# Patient Record
Sex: Male | Born: 1945 | Race: Black or African American | Hispanic: No | Marital: Married | Smoking: Former smoker
Health system: Southern US, Community
[De-identification: ages and names within clinical notes are randomized; demographics above are authoritative.]

## PROBLEM LIST (undated history)

## (undated) DIAGNOSIS — T4145XA Adverse effect of unspecified anesthetic, initial encounter: Secondary | ICD-10-CM

## (undated) DIAGNOSIS — C801 Malignant (primary) neoplasm, unspecified: Secondary | ICD-10-CM

## (undated) DIAGNOSIS — I739 Peripheral vascular disease, unspecified: Secondary | ICD-10-CM

## (undated) DIAGNOSIS — I219 Acute myocardial infarction, unspecified: Secondary | ICD-10-CM

## (undated) DIAGNOSIS — Z87442 Personal history of urinary calculi: Secondary | ICD-10-CM

## (undated) DIAGNOSIS — M1990 Unspecified osteoarthritis, unspecified site: Secondary | ICD-10-CM

## (undated) DIAGNOSIS — N401 Enlarged prostate with lower urinary tract symptoms: Secondary | ICD-10-CM

## (undated) DIAGNOSIS — I2510 Atherosclerotic heart disease of native coronary artery without angina pectoris: Secondary | ICD-10-CM

## (undated) DIAGNOSIS — N201 Calculus of ureter: Secondary | ICD-10-CM

## (undated) DIAGNOSIS — E119 Type 2 diabetes mellitus without complications: Secondary | ICD-10-CM

## (undated) DIAGNOSIS — I10 Essential (primary) hypertension: Secondary | ICD-10-CM

## (undated) DIAGNOSIS — R011 Cardiac murmur, unspecified: Secondary | ICD-10-CM

## (undated) DIAGNOSIS — T8859XA Other complications of anesthesia, initial encounter: Secondary | ICD-10-CM

## (undated) DIAGNOSIS — K449 Diaphragmatic hernia without obstruction or gangrene: Secondary | ICD-10-CM

## (undated) DIAGNOSIS — R351 Nocturia: Secondary | ICD-10-CM

## (undated) DIAGNOSIS — I350 Nonrheumatic aortic (valve) stenosis: Secondary | ICD-10-CM

## (undated) DIAGNOSIS — Z972 Presence of dental prosthetic device (complete) (partial): Secondary | ICD-10-CM

## (undated) DIAGNOSIS — K08109 Complete loss of teeth, unspecified cause, unspecified class: Secondary | ICD-10-CM

## (undated) DIAGNOSIS — Z973 Presence of spectacles and contact lenses: Secondary | ICD-10-CM

## (undated) DIAGNOSIS — H409 Unspecified glaucoma: Secondary | ICD-10-CM

## (undated) DIAGNOSIS — R6510 Systemic inflammatory response syndrome (SIRS) of non-infectious origin without acute organ dysfunction: Secondary | ICD-10-CM

## (undated) HISTORY — PX: VASCULAR SURGERY: SHX849

## (undated) HISTORY — PX: CORONARY ARTERY BYPASS GRAFT: SHX141

## (undated) HISTORY — PX: AORTIC VALVE REPLACEMENT: SHX41

## (undated) HISTORY — PX: COLONOSCOPY: SHX174

---

## 2017-04-02 DIAGNOSIS — R011 Cardiac murmur, unspecified: Secondary | ICD-10-CM | POA: Diagnosis not present

## 2017-04-02 DIAGNOSIS — N400 Enlarged prostate without lower urinary tract symptoms: Secondary | ICD-10-CM | POA: Diagnosis not present

## 2017-04-02 DIAGNOSIS — Z8639 Personal history of other endocrine, nutritional and metabolic disease: Secondary | ICD-10-CM | POA: Diagnosis not present

## 2017-04-02 DIAGNOSIS — Z1389 Encounter for screening for other disorder: Secondary | ICD-10-CM | POA: Diagnosis not present

## 2017-04-02 DIAGNOSIS — E041 Nontoxic single thyroid nodule: Secondary | ICD-10-CM | POA: Diagnosis not present

## 2017-04-02 DIAGNOSIS — Z136 Encounter for screening for cardiovascular disorders: Secondary | ICD-10-CM | POA: Diagnosis not present

## 2017-04-02 DIAGNOSIS — Z7984 Long term (current) use of oral hypoglycemic drugs: Secondary | ICD-10-CM | POA: Diagnosis not present

## 2017-04-02 DIAGNOSIS — E1159 Type 2 diabetes mellitus with other circulatory complications: Secondary | ICD-10-CM | POA: Diagnosis not present

## 2017-04-02 DIAGNOSIS — I10 Essential (primary) hypertension: Secondary | ICD-10-CM | POA: Diagnosis not present

## 2017-04-02 DIAGNOSIS — Z0000 Encounter for general adult medical examination without abnormal findings: Secondary | ICD-10-CM | POA: Diagnosis not present

## 2017-04-02 DIAGNOSIS — I739 Peripheral vascular disease, unspecified: Secondary | ICD-10-CM | POA: Diagnosis not present

## 2017-04-05 ENCOUNTER — Other Ambulatory Visit: Payer: Self-pay | Admitting: Orthopedic Surgery

## 2017-04-05 ENCOUNTER — Ambulatory Visit: Payer: Self-pay | Admitting: Orthopedic Surgery

## 2017-04-06 ENCOUNTER — Other Ambulatory Visit: Payer: Self-pay | Admitting: Family Medicine

## 2017-04-06 DIAGNOSIS — E041 Nontoxic single thyroid nodule: Secondary | ICD-10-CM

## 2017-04-13 ENCOUNTER — Ambulatory Visit
Admission: RE | Admit: 2017-04-13 | Discharge: 2017-04-13 | Disposition: A | Discharge status: Home / Self Care | Payer: PPO | Source: Ambulatory Visit | Attending: Family Medicine | Admitting: Family Medicine

## 2017-04-13 DIAGNOSIS — R972 Elevated prostate specific antigen [PSA]: Secondary | ICD-10-CM | POA: Diagnosis not present

## 2017-04-13 DIAGNOSIS — N5201 Erectile dysfunction due to arterial insufficiency: Secondary | ICD-10-CM | POA: Diagnosis not present

## 2017-04-13 DIAGNOSIS — R3915 Urgency of urination: Secondary | ICD-10-CM | POA: Diagnosis not present

## 2017-04-13 DIAGNOSIS — E041 Nontoxic single thyroid nodule: Secondary | ICD-10-CM | POA: Diagnosis not present

## 2017-05-19 ENCOUNTER — Encounter (HOSPITAL_COMMUNITY): Payer: Self-pay

## 2017-05-19 NOTE — Patient Instructions (Addendum)
Jeremy Nicholson  05/19/2017   Your procedure is scheduled on: Friday, Dec. 28, 2018   Report to [facility] Main  Entrance   Take [place]  elevators to 3rd floor to  Jeremy Nicholson at 11:15 AM.   Call this number if you have problems the morning of surgery [phone]    Remember: ONLY 1 PERSON MAY GO WITH YOU TO SHORT STAY TO GET  READY MORNING OF Jeremy Nicholson.   Do not eat food or drink liquids :After Midnight.    Take these medicines the morning of surgery with A SIP OF WATER: Amlodipine, Metoprolol   Do NOT take Glucotrol and Metformin the evening before surgery   DO NOT TAKE ANY DIABETIC MEDICATIONS DAY OF YOUR SURGERY                               You may not have any metal on your body including jewelry and body piercings               Do not wear make-up, lotions, powders, perfumes, or deodorant                          Men may shave face and neck.   Do not bring valuables to the hospital. Jeremy Nicholson.   Contacts, dentures or bridgework may not be worn into surgery.   Leave suitcase in the car. After surgery it may be brought to your room.              Please read over the following fact sheets you were given: _____________________________________________________________________     [facility] - Preparing for Surgery Before surgery, you can play an important role.  Because skin is not sterile, your skin needs to be as free of germs as possible.  You can reduce the number of germs on your skin by washing with CHG (chlorahexidine gluconate) soap before surgery.  CHG is an antiseptic cleaner which kills germs and bonds with the skin to continue killing germs even after washing. Please DO NOT use if you have an allergy to CHG or antibacterial soaps.  If your skin becomes reddened/irritated stop using the CHG and inform your nurse when you arrive at Short Stay. Do not shave (including legs and  underarms) for at least 48 hours prior to the first CHG shower.  You may shave your face/neck.  Please follow these instructions carefully:  1.  Shower with CHG Soap the night before surgery and the  morning of surgery.  2.  If you choose to wash your hair, wash your hair first as usual with your normal  shampoo.  3.  After you shampoo, rinse your hair and body thoroughly to remove the shampoo.                             4.  Use CHG as you would any other liquid soap.  You can apply chg directly to the skin and wash.  Gently with a scrungie or clean washcloth.  5.  Apply the CHG Soap to your body ONLY FROM THE NECK DOWN.   Do   not use on  face/ open                           Wound or open sores. Avoid contact with eyes, ears mouth and   genitals (private parts).                       Wash face,  Genitals (private parts) with your normal soap.             6.  Wash thoroughly, paying special attention to the area where your    surgery  will be performed.  7.  Thoroughly rinse your body with warm water from the neck down.  8.  DO NOT shower/wash with your normal soap after using and rinsing off the CHG Soap.                9.  Pat yourself dry with a clean towel.            10.  Wear clean pajamas.            11.  Place clean sheets on your bed the night of your first shower and do not  sleep with pets. Day of Surgery : Do not apply any lotions/deodorants the morning of surgery.  Please wear clean clothes to the hospital/surgery center.  FAILURE TO FOLLOW THESE INSTRUCTIONS MAY RESULT IN THE CANCELLATION OF YOUR SURGERY  PATIENT SIGNATURE_________________________________  NURSE SIGNATURE__________________________________  ________________________________________________________________________   Jeremy Nicholson  An incentive spirometer is a tool that can help keep your lungs clear and active. This tool measures how well you are filling your lungs with each breath. Taking long deep  breaths may help reverse or decrease the chance of developing breathing (pulmonary) problems (especially infection) following:  A long period of time when you are unable to move or be active. BEFORE THE PROCEDURE   If the spirometer includes an indicator to show your best effort, your nurse or respiratory therapist will set it to a desired goal.  If possible, sit up straight or lean slightly forward. Try not to slouch.  Hold the incentive spirometer in an upright position. INSTRUCTIONS FOR USE  1. Sit on the edge of your bed if possible, or sit up as far as you can in bed or on a chair. 2. Hold the incentive spirometer in an upright position. 3. Breathe out normally. 4. Place the mouthpiece in your mouth and seal your lips tightly around it. 5. Breathe in slowly and as deeply as possible, raising the piston or the ball toward the top of the column. 6. Hold your breath for 3-5 seconds or for as long as possible. Allow the piston or ball to fall to the bottom of the column. 7. Remove the mouthpiece from your mouth and breathe out normally. 8. Rest for a few seconds and repeat Steps 1 through 7 at least 10 times every 1-2 hours when you are awake. Take your time and take a few normal breaths between deep breaths. 9. The spirometer may include an indicator to show your best effort. Use the indicator as a goal to work toward during each repetition. 10. After each set of 10 deep breaths, practice coughing to be sure your lungs are clear. If you have an incision (the cut made at the time of surgery), support your incision when coughing by placing a pillow or rolled up towels firmly against it. Once you are able to get out  of bed, walk around indoors and cough well. You may stop using the incentive spirometer when instructed by your caregiver.  RISKS AND COMPLICATIONS  Take your time so you do not get dizzy or light-headed.  If you are in pain, you may need to take or ask for pain medication before  doing incentive spirometry. It is harder to take a deep breath if you are having pain. AFTER USE  Rest and breathe slowly and easily.  It can be helpful to keep track of a log of your progress. Your caregiver can provide you with a simple table to help with this. If you are using the spirometer at home, follow these instructions: [place] IF:   You are having difficultly using the spirometer.  You have trouble using the spirometer as often as instructed.  Your pain medication is not giving enough relief while using the spirometer.  You develop fever of 100.5 F (38.1 C) or higher. SEEK IMMEDIATE MEDICAL CARE IF:   You cough up bloody sputum that had not been present before.  You develop fever of 102 F (38.9 C) or greater.  You develop worsening pain at or near the incision site. MAKE SURE YOU:   Understand these instructions.  Will watch your condition.  Will get help right away if you are not doing well or get worse. Document Released: 09/28/2006 Document Revised: 08/10/2011 Document Reviewed: 11/29/2006 [facility] Patient Information 2014 [place], [place].   ________________________________________________________________________

## 2017-05-19 NOTE — Pre-Procedure Instructions (Signed)
The following are in the chart: Surgical clearance Henrene Pastor Last office note Heyat 05/05/17 EKG 10/14/16 CBCw/diff, Hgb A1C 7.8, BMP 05/05/17

## 2017-05-21 ENCOUNTER — Encounter (HOSPITAL_COMMUNITY): Payer: Self-pay

## 2017-05-21 ENCOUNTER — Encounter (INDEPENDENT_AMBULATORY_CARE_PROVIDER_SITE_OTHER): Payer: Self-pay

## 2017-05-21 ENCOUNTER — Other Ambulatory Visit: Payer: Self-pay

## 2017-05-21 ENCOUNTER — Encounter (HOSPITAL_COMMUNITY)
Admission: RE | Admit: 2017-05-21 | Discharge: 2017-05-21 | Disposition: A | Discharge status: Home / Self Care | Payer: Non-veteran care | Source: Ambulatory Visit | Attending: Specialist | Admitting: Specialist

## 2017-05-21 DIAGNOSIS — M1712 Unilateral primary osteoarthritis, left knee: Secondary | ICD-10-CM | POA: Insufficient documentation

## 2017-05-21 DIAGNOSIS — Z01812 Encounter for preprocedural laboratory examination: Secondary | ICD-10-CM | POA: Diagnosis present

## 2017-05-21 HISTORY — DX: Unspecified osteoarthritis, unspecified site: M19.90

## 2017-05-21 HISTORY — DX: Essential (primary) hypertension: I10

## 2017-05-21 LAB — APTT: aPTT: 31 seconds (ref 24–36)

## 2017-05-21 LAB — URINALYSIS, ROUTINE W REFLEX MICROSCOPIC
BACTERIA UA: NONE SEEN
BILIRUBIN URINE: NEGATIVE
Hgb urine dipstick: NEGATIVE
Ketones, ur: NEGATIVE mg/dL
LEUKOCYTES UA: NEGATIVE
NITRITE: NEGATIVE
PH: 6 (ref 5.0–8.0)
Protein, ur: 100 mg/dL — AB
Specific Gravity, Urine: 1.024 (ref 1.005–1.030)
Squamous Epithelial / LPF: NONE SEEN

## 2017-05-21 LAB — PROTIME-INR
INR: 0.95
PROTHROMBIN TIME: 12.6 s (ref 11.4–15.2)

## 2017-05-21 LAB — SURGICAL PCR SCREEN
MRSA, PCR: NEGATIVE
Staphylococcus aureus: NEGATIVE

## 2017-05-21 LAB — NO BLOOD PRODUCTS

## 2017-05-21 LAB — GLUCOSE, CAPILLARY: Glucose-Capillary: 82 mg/dL (ref 65–99)

## 2017-05-21 NOTE — Pre-Procedure Instructions (Signed)
UA results 05/21/17 faxed to Dr. Theda Sers via epic.

## 2017-05-21 NOTE — Pre-Procedure Instructions (Signed)
Dr. Sharlene Motts made aware of Mr. Jeremy Nicholson refusal of blood product consent, did not request to consult with patient at this time.

## 2017-05-26 NOTE — H&P (Signed)
TOTAL KNEE ADMISSION H&P  Patient is being admitted for left total knee arthroplasty.  Subjective:  Chief Complaint:left knee pain.  HPI: Jeremy Nicholson, 71 y.o. male, has a history of pain and functional disability in the left knee due to arthritis and has failed non-surgical conservative treatments for greater than 12 weeks to includecorticosteriod injections, viscosupplementation injections, weight reduction as appropriate and activity modification.  Onset of symptoms was gradual, starting 3 years ago with gradually worsening course since that time. The patient noted no past surgery on the left knee(s).  Patient currently rates pain in the left knee(s) at 8 out of 10 with activity. Patient has worsening of pain with activity and weight bearing, pain that interferes with activities of daily living, pain with passive range of motion and joint swelling.  Patient has evidence of subchondral sclerosis, periarticular osteophytes and joint space narrowing by imaging studies. There is no active infection.  There are no active problems to display for this patient.  Past Medical History:  Diagnosis Date  . Arthritis   . Diabetes mellitus without complication ([place])   . Heart murmur   . History of hiatal hernia   . Hypertension     Past Surgical History:  Procedure Laterality Date  . COLONOSCOPY      No current facility-administered medications for this encounter.    Current Outpatient Medications  Medication Sig Dispense Refill Last Dose  . amLODipine (NORVASC) 10 MG tablet Take 10 mg by mouth daily.     Marland Kitchen aspirin EC 81 MG tablet Take 81 mg by mouth daily.     . capsaicin (ZOSTRIX) 0.025 % cream Apply 1 application topically 2 (two) times daily as needed (for knee).     . Cholecalciferol 1000 units tablet Take 1,000 Units by mouth daily.     Marland Kitchen glipiZIDE (GLUCOTROL) 10 MG tablet Take 10 mg by mouth 2 (two) times daily.     Marland Kitchen ibuprofen (ADVIL,MOTRIN) 600 MG tablet Take 600 mg by mouth every 6  (six) hours as needed for headache or moderate pain.     . metFORMIN (GLUCOPHAGE) 500 MG tablet Take 1,000 mg by mouth 2 (two) times daily with a meal.     . metoprolol tartrate (LOPRESSOR) 50 MG tablet Take 50 mg by mouth daily.     . niacin (SLO-NIACIN) 500 MG tablet Take 500 mg by mouth daily.     . tamsulosin (FLOMAX) 0.4 MG CAPS capsule Take 0.4 mg by mouth at bedtime.     . empagliflozin (JARDIANCE) 10 MG TABS tablet Take 10 mg by mouth daily.      No Known Allergies  Social History   Tobacco Use  . Smoking status: Never Smoker  . Smokeless tobacco: Never Used  Substance Use Topics  . Alcohol use: No    Frequency: Never    No family history on file.   Review of Systems  Constitutional: Negative.   HENT: Negative.   Eyes: Negative.   Respiratory: Negative.   Cardiovascular: Negative for chest pain and orthopnea.  Gastrointestinal: Negative.   Genitourinary: Negative.   Musculoskeletal: Positive for joint pain.  Skin: Negative.   Neurological: Negative.   Endo/Heme/Allergies: Negative.   Psychiatric/Behavioral: Negative.     Objective:  Physical Exam  Constitutional: He is oriented to person, place, and time. He appears well-developed.  HENT:  Head: Normocephalic.  Eyes: EOM are normal.  Neck: Normal range of motion.  Cardiovascular: Normal rate and intact distal pulses.  Murmur heard. Respiratory: Effort  normal.  GI: Soft.  Musculoskeletal:  Knee pain. Knee is stable. LE grossly n/v intact.  Neurological: He is alert and oriented to person, place, and time. He has normal reflexes.  Skin: Skin is warm and dry.  Psychiatric: His behavior is normal.    Vital signs in last 24 hours:    Labs:   Estimated body mass index is 26.58 kg/m as calculated from the following:   Height as of 05/21/17: 5\' 9"  (1.753 m).   Weight as of 05/21/17: 81.6 kg (180 lb).   Imaging Review Plain radiographs demonstrate severe degenerative joint disease of the left knee(s).  The overall alignment ismild varus. The bone quality appears to be good for age and reported activity level.  Assessment/Plan:  End stage arthritis, left knee   The patient history, physical examination, clinical judgment of the provider and imaging studies are consistent with end stage degenerative joint disease of the left knee(s) and total knee arthroplasty is deemed medically necessary. The treatment options including medical management, injection therapy arthroscopy and arthroplasty were discussed at length. The risks and benefits of total knee arthroplasty were presented and reviewed. The risks due to aseptic loosening, infection, stiffness, patella tracking problems, thromboembolic complications and other imponderables were discussed. The patient acknowledged the explanation, agreed to proceed with the plan and consent was signed. Patient is being admitted for inpatient treatment for surgery, pain control, PT, OT, prophylactic antibiotics, VTE prophylaxis, progressive ambulation and ADL's and discharge planning. The patient is planning to be discharged home with home health services.  Will use IV tranexamic acid. Contraindications and adverse affects of Tranexamic acid discussed in detail. Patient denies any of these at this time and understands the risks and benefits.

## 2017-05-27 NOTE — H&P (View-Only) (Signed)
bmet 05-26-17 , hemaglobin a 1c  05-26-17, ua 05-26-17 quest labs received and placed on pt chart

## 2017-05-27 NOTE — Progress Notes (Signed)
bmet 05-26-17 , hemaglobin a 1c  05-26-17, ua 05-26-17 quest labs received and placed on pt chart

## 2017-05-28 ENCOUNTER — Other Ambulatory Visit: Payer: Self-pay

## 2017-05-28 ENCOUNTER — Inpatient Hospital Stay (HOSPITAL_COMMUNITY)
Admission: RE | Admit: 2017-05-28 | Discharge: 2017-05-30 | DRG: 470 | Disposition: A | Discharge status: Home / Self Care | Payer: Non-veteran care | Source: Ambulatory Visit | Attending: Specialist | Admitting: Specialist

## 2017-05-28 ENCOUNTER — Encounter (HOSPITAL_COMMUNITY)
Admission: RE | Disposition: A | Discharge status: Home / Self Care | Payer: Self-pay | Source: Ambulatory Visit | Attending: Specialist

## 2017-05-28 ENCOUNTER — Inpatient Hospital Stay (HOSPITAL_COMMUNITY): Payer: Non-veteran care | Admitting: Registered Nurse

## 2017-05-28 ENCOUNTER — Encounter (HOSPITAL_COMMUNITY): Payer: Self-pay | Admitting: Registered Nurse

## 2017-05-28 DIAGNOSIS — R011 Cardiac murmur, unspecified: Secondary | ICD-10-CM | POA: Diagnosis present

## 2017-05-28 DIAGNOSIS — M25562 Pain in left knee: Secondary | ICD-10-CM | POA: Diagnosis present

## 2017-05-28 DIAGNOSIS — Z7982 Long term (current) use of aspirin: Secondary | ICD-10-CM | POA: Diagnosis not present

## 2017-05-28 DIAGNOSIS — Z7984 Long term (current) use of oral hypoglycemic drugs: Secondary | ICD-10-CM | POA: Diagnosis not present

## 2017-05-28 DIAGNOSIS — M1712 Unilateral primary osteoarthritis, left knee: Secondary | ICD-10-CM | POA: Diagnosis present

## 2017-05-28 DIAGNOSIS — E119 Type 2 diabetes mellitus without complications: Secondary | ICD-10-CM | POA: Diagnosis present

## 2017-05-28 DIAGNOSIS — Z96659 Presence of unspecified artificial knee joint: Secondary | ICD-10-CM

## 2017-05-28 DIAGNOSIS — R5082 Postprocedural fever: Secondary | ICD-10-CM | POA: Diagnosis not present

## 2017-05-28 DIAGNOSIS — I10 Essential (primary) hypertension: Secondary | ICD-10-CM | POA: Diagnosis present

## 2017-05-28 DIAGNOSIS — Z96652 Presence of left artificial knee joint: Secondary | ICD-10-CM | POA: Diagnosis not present

## 2017-05-28 DIAGNOSIS — R6510 Systemic inflammatory response syndrome (SIRS) of non-infectious origin without acute organ dysfunction: Secondary | ICD-10-CM | POA: Diagnosis not present

## 2017-05-28 DIAGNOSIS — I361 Nonrheumatic tricuspid (valve) insufficiency: Secondary | ICD-10-CM | POA: Diagnosis not present

## 2017-05-28 HISTORY — PX: TOTAL KNEE ARTHROPLASTY: SHX125

## 2017-05-28 LAB — GLUCOSE, CAPILLARY
GLUCOSE-CAPILLARY: 310 mg/dL — AB (ref 65–99)
Glucose-Capillary: 106 mg/dL — ABNORMAL HIGH (ref 65–99)
Glucose-Capillary: 237 mg/dL — ABNORMAL HIGH (ref 65–99)

## 2017-05-28 SURGERY — ARTHROPLASTY, KNEE, TOTAL
Anesthesia: Spinal | Site: Knee | Laterality: Left

## 2017-05-28 MED ORDER — MIDAZOLAM HCL 2 MG/2ML IJ SOLN
INTRAMUSCULAR | Status: AC
  Filled 2017-05-28: qty 2

## 2017-05-28 MED ORDER — ASPIRIN EC 325 MG PO TBEC: 325.0000 mg | DELAYED_RELEASE_TABLET | Freq: Two times a day (BID) | ORAL | 0 refills | Status: DC

## 2017-05-28 MED ORDER — PHENYLEPHRINE 40 MCG/ML (10ML) SYRINGE FOR IV PUSH (FOR BLOOD PRESSURE SUPPORT)
PREFILLED_SYRINGE | INTRAVENOUS | Status: AC
  Filled 2017-05-28: qty 10

## 2017-05-28 MED ORDER — PROPOFOL 10 MG/ML IV BOLUS
INTRAVENOUS | Status: AC
  Filled 2017-05-28: qty 60

## 2017-05-28 MED ORDER — STERILE WATER FOR IRRIGATION IR SOLN
Status: DC | PRN
  Administered 2017-05-28: 2000 mL

## 2017-05-28 MED ORDER — OXYCODONE HCL 5 MG PO TABS: 5.0000 mg | ORAL_TABLET | ORAL | 0 refills | Status: DC | PRN

## 2017-05-28 MED ORDER — GLIPIZIDE 10 MG PO TABS
10.0000 mg | ORAL_TABLET | Freq: Two times a day (BID) | ORAL | Status: DC
  Administered 2017-05-29 – 2017-05-30 (×3): 10 mg via ORAL
  Filled 2017-05-28 (×3): qty 1

## 2017-05-28 MED ORDER — METHOCARBAMOL 500 MG PO TABS: 500.0000 mg | ORAL_TABLET | Freq: Three times a day (TID) | ORAL | 1 refills | Status: DC | PRN

## 2017-05-28 MED ORDER — DEXAMETHASONE SODIUM PHOSPHATE 10 MG/ML IJ SOLN
INTRAMUSCULAR | Status: AC
  Filled 2017-05-28: qty 1

## 2017-05-28 MED ORDER — DEXMEDETOMIDINE HCL IN NACL 200 MCG/50ML IV SOLN
20.0000 ug/h | INTRAVENOUS | Status: DC
  Administered 2017-05-28: 20 ug via INTRAVENOUS
  Filled 2017-05-28: qty 50

## 2017-05-28 MED ORDER — OXYCODONE HCL ER 10 MG PO T12A
10.0000 mg | EXTENDED_RELEASE_TABLET | Freq: Two times a day (BID) | ORAL | Status: DC
  Administered 2017-05-29 – 2017-05-30 (×3): 10 mg via ORAL
  Filled 2017-05-28 (×3): qty 1

## 2017-05-28 MED ORDER — ACETAMINOPHEN 650 MG RE SUPP: 650.0000 mg | RECTAL | Status: DC | PRN

## 2017-05-28 MED ORDER — KETOROLAC TROMETHAMINE 30 MG/ML IJ SOLN
INTRAMUSCULAR | Status: DC | PRN
  Administered 2017-05-28: 30 mg

## 2017-05-28 MED ORDER — FENTANYL CITRATE (PF) 100 MCG/2ML IJ SOLN
INTRAMUSCULAR | Status: AC
  Filled 2017-05-28: qty 4

## 2017-05-28 MED ORDER — LIDOCAINE 2% (20 MG/ML) 5 ML SYRINGE
INTRAMUSCULAR | Status: AC
  Filled 2017-05-28: qty 5

## 2017-05-28 MED ORDER — BUPIVACAINE-EPINEPHRINE 0.25% -1:200000 IJ SOLN
INTRAMUSCULAR | Status: DC | PRN
  Administered 2017-05-28: 30 mL

## 2017-05-28 MED ORDER — POLYETHYLENE GLYCOL 3350 17 G PO PACK: 17.0000 g | PACK | Freq: Every day | ORAL | Status: DC | PRN

## 2017-05-28 MED ORDER — FENTANYL CITRATE (PF) 250 MCG/5ML IJ SOLN
INTRAMUSCULAR | Status: AC
  Filled 2017-05-28: qty 5

## 2017-05-28 MED ORDER — PROPOFOL 10 MG/ML IV BOLUS
INTRAVENOUS | Status: DC | PRN
  Administered 2017-05-28: 20 mg via INTRAVENOUS
  Administered 2017-05-28: 10 mg via INTRAVENOUS
  Administered 2017-05-28: 130 mg via INTRAVENOUS

## 2017-05-28 MED ORDER — PHENYLEPHRINE HCL 10 MG/ML IJ SOLN
INTRAMUSCULAR | Status: DC | PRN
  Administered 2017-05-28: 80 ug via INTRAVENOUS

## 2017-05-28 MED ORDER — CHLORHEXIDINE GLUCONATE 4 % EX LIQD: 60.0000 mL | Freq: Once | CUTANEOUS | Status: DC

## 2017-05-28 MED ORDER — PHENOL 1.4 % MT LIQD: 1.0000 | OROMUCOSAL | Status: DC | PRN

## 2017-05-28 MED ORDER — BUPIVACAINE-EPINEPHRINE 0.25% -1:200000 IJ SOLN
INTRAMUSCULAR | Status: AC
  Filled 2017-05-28: qty 1

## 2017-05-28 MED ORDER — CEFAZOLIN SODIUM-DEXTROSE 2-4 GM/100ML-% IV SOLN
2.0000 g | INTRAVENOUS | Status: AC
  Administered 2017-05-28: 2 g via INTRAVENOUS
  Filled 2017-05-28: qty 100

## 2017-05-28 MED ORDER — METOPROLOL TARTRATE 50 MG PO TABS
50.0000 mg | ORAL_TABLET | Freq: Every day | ORAL | Status: DC
  Administered 2017-05-29 – 2017-05-30 (×2): 50 mg via ORAL
  Filled 2017-05-28 (×2): qty 1

## 2017-05-28 MED ORDER — ENOXAPARIN SODIUM 30 MG/0.3ML ~~LOC~~ SOLN
30.0000 mg | Freq: Two times a day (BID) | SUBCUTANEOUS | Status: DC
  Administered 2017-05-29 – 2017-05-30 (×3): 30 mg via SUBCUTANEOUS
  Filled 2017-05-28 (×3): qty 0.3

## 2017-05-28 MED ORDER — LACTATED RINGERS IV SOLN
INTRAVENOUS | Status: DC
  Administered 2017-05-28 (×2): via INTRAVENOUS

## 2017-05-28 MED ORDER — ALUM & MAG HYDROXIDE-SIMETH 200-200-20 MG/5ML PO SUSP: 30.0000 mL | ORAL | Status: DC | PRN

## 2017-05-28 MED ORDER — ONDANSETRON HCL 4 MG/2ML IJ SOLN
INTRAMUSCULAR | Status: AC
  Filled 2017-05-28: qty 2

## 2017-05-28 MED ORDER — CEFAZOLIN SODIUM-DEXTROSE 2-4 GM/100ML-% IV SOLN
2.0000 g | Freq: Four times a day (QID) | INTRAVENOUS | Status: AC
  Administered 2017-05-28 – 2017-05-29 (×2): 2 g via INTRAVENOUS
  Filled 2017-05-28 (×2): qty 100

## 2017-05-28 MED ORDER — ACETAMINOPHEN 325 MG PO TABS: 650.0000 mg | ORAL_TABLET | ORAL | Status: DC | PRN

## 2017-05-28 MED ORDER — FENTANYL CITRATE (PF) 100 MCG/2ML IJ SOLN
INTRAMUSCULAR | Status: AC
  Filled 2017-05-28: qty 2

## 2017-05-28 MED ORDER — TAMSULOSIN HCL 0.4 MG PO CAPS
0.4000 mg | ORAL_CAPSULE | Freq: Every day | ORAL | Status: DC
  Administered 2017-05-28 – 2017-05-29 (×2): 0.4 mg via ORAL
  Filled 2017-05-28 (×2): qty 1

## 2017-05-28 MED ORDER — SODIUM CHLORIDE 0.9 % IJ SOLN
INTRAMUSCULAR | Status: DC | PRN
  Administered 2017-05-28: 30 mL

## 2017-05-28 MED ORDER — FERROUS SULFATE 325 (65 FE) MG PO TABS
325.0000 mg | ORAL_TABLET | Freq: Three times a day (TID) | ORAL | Status: DC
  Administered 2017-05-29 – 2017-05-30 (×2): 325 mg via ORAL
  Filled 2017-05-28 (×3): qty 1

## 2017-05-28 MED ORDER — METFORMIN HCL 500 MG PO TABS
1000.0000 mg | ORAL_TABLET | Freq: Two times a day (BID) | ORAL | Status: DC
  Administered 2017-05-29 – 2017-05-30 (×3): 1000 mg via ORAL
  Filled 2017-05-28 (×3): qty 2

## 2017-05-28 MED ORDER — METHOCARBAMOL 500 MG PO TABS
500.0000 mg | ORAL_TABLET | Freq: Four times a day (QID) | ORAL | Status: DC | PRN
  Administered 2017-05-28 – 2017-05-30 (×5): 500 mg via ORAL
  Filled 2017-05-28 (×5): qty 1

## 2017-05-28 MED ORDER — OXYCODONE HCL 5 MG PO TABS
10.0000 mg | ORAL_TABLET | ORAL | Status: DC | PRN
  Administered 2017-05-28 – 2017-05-30 (×7): 10 mg via ORAL
  Filled 2017-05-28 (×7): qty 2

## 2017-05-28 MED ORDER — NIACIN ER 500 MG PO TBCR
500.0000 mg | EXTENDED_RELEASE_TABLET | Freq: Every day | ORAL | Status: DC
  Administered 2017-05-29 – 2017-05-30 (×2): 500 mg via ORAL
  Filled 2017-05-28 (×2): qty 1

## 2017-05-28 MED ORDER — MIDAZOLAM HCL 5 MG/5ML IJ SOLN
INTRAMUSCULAR | Status: DC | PRN
  Administered 2017-05-28 (×2): 1 mg via INTRAVENOUS

## 2017-05-28 MED ORDER — VITAMIN D3 25 MCG (1000 UNIT) PO TABS
1000.0000 [IU] | ORAL_TABLET | Freq: Every day | ORAL | Status: DC
  Administered 2017-05-29 – 2017-05-30 (×2): 1000 [IU] via ORAL
  Filled 2017-05-28 (×2): qty 1

## 2017-05-28 MED ORDER — SODIUM CHLORIDE 0.9 % IJ SOLN
INTRAMUSCULAR | Status: AC
  Filled 2017-05-28: qty 50

## 2017-05-28 MED ORDER — METHOCARBAMOL 1000 MG/10ML IJ SOLN
500.0000 mg | Freq: Four times a day (QID) | INTRAVENOUS | Status: DC | PRN
  Filled 2017-05-28: qty 5

## 2017-05-28 MED ORDER — 0.9 % SODIUM CHLORIDE (POUR BTL) OPTIME
TOPICAL | Status: DC | PRN
  Administered 2017-05-28: 1000 mL

## 2017-05-28 MED ORDER — DOCUSATE SODIUM 100 MG PO CAPS
100.0000 mg | ORAL_CAPSULE | Freq: Two times a day (BID) | ORAL | Status: DC
  Administered 2017-05-29 – 2017-05-30 (×3): 100 mg via ORAL
  Filled 2017-05-28 (×3): qty 1

## 2017-05-28 MED ORDER — INSULIN ASPART 100 UNIT/ML ~~LOC~~ SOLN
0.0000 [IU] | Freq: Three times a day (TID) | SUBCUTANEOUS | Status: DC
  Administered 2017-05-30: 2 [IU] via SUBCUTANEOUS

## 2017-05-28 MED ORDER — PHENYLEPHRINE HCL 10 MG/ML IJ SOLN
INTRAMUSCULAR | Status: AC
  Filled 2017-05-28: qty 1

## 2017-05-28 MED ORDER — HYDROMORPHONE HCL 1 MG/ML IJ SOLN: 1.0000 mg | INTRAMUSCULAR | Status: DC | PRN

## 2017-05-28 MED ORDER — ONDANSETRON HCL 4 MG PO TABS: 4.0000 mg | ORAL_TABLET | Freq: Four times a day (QID) | ORAL | Status: DC | PRN

## 2017-05-28 MED ORDER — DIPHENHYDRAMINE HCL 12.5 MG/5ML PO ELIX: 12.5000 mg | ORAL_SOLUTION | ORAL | Status: DC | PRN

## 2017-05-28 MED ORDER — SODIUM CHLORIDE 0.9 % IR SOLN
Status: DC | PRN
  Administered 2017-05-28: 1000 mL

## 2017-05-28 MED ORDER — KETOROLAC TROMETHAMINE 30 MG/ML IJ SOLN
INTRAMUSCULAR | Status: AC
  Filled 2017-05-28: qty 1

## 2017-05-28 MED ORDER — ONDANSETRON HCL 4 MG/2ML IJ SOLN: 4.0000 mg | Freq: Four times a day (QID) | INTRAMUSCULAR | Status: DC | PRN

## 2017-05-28 MED ORDER — METOCLOPRAMIDE HCL 5 MG PO TABS: 5.0000 mg | ORAL_TABLET | Freq: Three times a day (TID) | ORAL | Status: DC | PRN

## 2017-05-28 MED ORDER — SODIUM CHLORIDE 0.9 % IV SOLN
INTRAVENOUS | Status: DC
  Administered 2017-05-28: 50 mL/h via INTRAVENOUS

## 2017-05-28 MED ORDER — SUGAMMADEX SODIUM 200 MG/2ML IV SOLN
INTRAVENOUS | Status: AC
  Filled 2017-05-28: qty 2

## 2017-05-28 MED ORDER — AMLODIPINE BESYLATE 10 MG PO TABS
10.0000 mg | ORAL_TABLET | Freq: Every day | ORAL | Status: DC
  Administered 2017-05-29 – 2017-05-30 (×2): 10 mg via ORAL
  Filled 2017-05-28 (×2): qty 1

## 2017-05-28 MED ORDER — MENTHOL 3 MG MT LOZG: 1.0000 | LOZENGE | OROMUCOSAL | Status: DC | PRN

## 2017-05-28 MED ORDER — PHENYLEPHRINE HCL 10 MG/ML IJ SOLN
INTRAVENOUS | Status: DC | PRN
  Administered 2017-05-28: 15 ug/min via INTRAVENOUS

## 2017-05-28 MED ORDER — CANAGLIFLOZIN 100 MG PO TABS
100.0000 mg | ORAL_TABLET | Freq: Every day | ORAL | Status: DC
  Administered 2017-05-29 – 2017-05-30 (×2): 100 mg via ORAL
  Filled 2017-05-28 (×2): qty 1

## 2017-05-28 MED ORDER — OXYCODONE HCL 5 MG PO TABS
5.0000 mg | ORAL_TABLET | ORAL | Status: DC | PRN
  Administered 2017-05-28: 5 mg via ORAL
  Filled 2017-05-28: qty 1

## 2017-05-28 MED ORDER — PROPOFOL 10 MG/ML IV BOLUS
INTRAVENOUS | Status: AC
  Filled 2017-05-28: qty 20

## 2017-05-28 MED ORDER — TRANEXAMIC ACID 1000 MG/10ML IV SOLN
1000.0000 mg | INTRAVENOUS | Status: DC
  Administered 2017-05-28: 1000 mg via INTRAVENOUS
  Filled 2017-05-28: qty 1100

## 2017-05-28 MED ORDER — FENTANYL CITRATE (PF) 100 MCG/2ML IJ SOLN
INTRAMUSCULAR | Status: DC | PRN
  Administered 2017-05-28: 25 ug via INTRAVENOUS
  Administered 2017-05-28 (×4): 50 ug via INTRAVENOUS
  Administered 2017-05-28 (×3): 25 ug via INTRAVENOUS
  Administered 2017-05-28: 50 ug via INTRAVENOUS

## 2017-05-28 MED ORDER — METOCLOPRAMIDE HCL 5 MG/ML IJ SOLN: 5.0000 mg | Freq: Three times a day (TID) | INTRAMUSCULAR | Status: DC | PRN

## 2017-05-28 SURGICAL SUPPLY — 63 items
BAG ZIPLOCK 12X15 (MISCELLANEOUS) ×4 IMPLANT
BANDAGE ACE 4X5 VEL STRL LF (GAUZE/BANDAGES/DRESSINGS) ×2 IMPLANT
BANDAGE ACE 6X5 VEL STRL LF (GAUZE/BANDAGES/DRESSINGS) ×2 IMPLANT
BLADE SAG 18X100X1.27 (BLADE) ×2 IMPLANT
BLADE SAW SGTL 13.0X1.19X90.0M (BLADE) ×2 IMPLANT
BONE CEMENT GENTAMICIN (Cement) ×4 IMPLANT
BOWL SMART MIX CTS (DISPOSABLE) ×2 IMPLANT
CAP KNEE TOTAL 3 SIGMA ×2 IMPLANT
CEMENT BONE GENTAMICIN 40 (Cement) ×2 IMPLANT
COVER SURGICAL LIGHT HANDLE (MISCELLANEOUS) ×2 IMPLANT
CUFF TOURN SGL QUICK 34 (TOURNIQUET CUFF) ×1
CUFF TRNQT CYL 34X4X40X1 (TOURNIQUET CUFF) ×1 IMPLANT
DECANTER SPIKE VIAL GLASS SM (MISCELLANEOUS) ×2 IMPLANT
DERMABOND ADVANCED (GAUZE/BANDAGES/DRESSINGS) ×1
DERMABOND ADVANCED .7 DNX12 (GAUZE/BANDAGES/DRESSINGS) ×1 IMPLANT
DRAPE U-SHAPE 47X51 STRL (DRAPES) ×2 IMPLANT
DRESSING AQUACEL AG SP 3.5X10 (GAUZE/BANDAGES/DRESSINGS) ×1 IMPLANT
DRSG AQUACEL AG SP 3.5X10 (GAUZE/BANDAGES/DRESSINGS) ×2
DRSG TEGADERM 4X4.75 (GAUZE/BANDAGES/DRESSINGS) ×2 IMPLANT
DURAPREP 26ML APPLICATOR (WOUND CARE) ×4 IMPLANT
ELECT REM PT RETURN 15FT ADLT (MISCELLANEOUS) ×2 IMPLANT
EVACUATOR 1/8 PVC DRAIN (DRAIN) IMPLANT
GAUZE SPONGE 2X2 8PLY STRL LF (GAUZE/BANDAGES/DRESSINGS) ×1 IMPLANT
GLOVE BIO SURGEON STRL SZ 6.5 (GLOVE) ×2 IMPLANT
GLOVE BIO SURGEON STRL SZ7.5 (GLOVE) ×4 IMPLANT
GLOVE BIO SURGEON STRL SZ8 (GLOVE) ×2 IMPLANT
GLOVE BIOGEL PI IND STRL 6.5 (GLOVE) ×1 IMPLANT
GLOVE BIOGEL PI IND STRL 7.5 (GLOVE) ×3 IMPLANT
GLOVE BIOGEL PI IND STRL 8 (GLOVE) ×3 IMPLANT
GLOVE BIOGEL PI INDICATOR 6.5 (GLOVE) ×1
GLOVE BIOGEL PI INDICATOR 7.5 (GLOVE) ×3
GLOVE BIOGEL PI INDICATOR 8 (GLOVE) ×3
GLOVE ECLIPSE 8.0 STRL XLNG CF (GLOVE) ×4 IMPLANT
GLOVE SURG ORTHO 9.0 STRL STRW (GLOVE) ×2 IMPLANT
GLOVE SURG SS PI 7.5 STRL IVOR (GLOVE) ×6 IMPLANT
GOWN STRL REUS W/TWL LRG LVL3 (GOWN DISPOSABLE) ×2 IMPLANT
GOWN STRL REUS W/TWL XL LVL3 (GOWN DISPOSABLE) ×6 IMPLANT
HANDPIECE INTERPULSE COAX TIP (DISPOSABLE) ×1
IMMOBILIZER KNEE 20 (SOFTGOODS) ×2
IMMOBILIZER KNEE 20 THIGH 36 (SOFTGOODS) ×1 IMPLANT
PACK TOTAL KNEE CUSTOM (KITS) ×2 IMPLANT
POSITIONER SURGICAL ARM (MISCELLANEOUS) ×2 IMPLANT
SET HNDPC FAN SPRY TIP SCT (DISPOSABLE) ×1 IMPLANT
SET PAD KNEE POSITIONER (MISCELLANEOUS) ×2 IMPLANT
SPONGE GAUZE 2X2 STER 10/PKG (GAUZE/BANDAGES/DRESSINGS) ×1
SPONGE LAP 18X18 X RAY DECT (DISPOSABLE) ×2 IMPLANT
SPONGE SURGIFOAM ABS GEL 100 (HEMOSTASIS) ×2 IMPLANT
STOCKINETTE 6  STRL (DRAPES) ×1
STOCKINETTE 6 STRL (DRAPES) ×1 IMPLANT
SUCTION FRAZIER HANDLE 12FR (TUBING) ×1
SUCTION TUBE FRAZIER 12FR DISP (TUBING) ×1 IMPLANT
SUT BONE WAX W31G (SUTURE) IMPLANT
SUT MNCRL AB 3-0 PS2 18 (SUTURE) ×2 IMPLANT
SUT VIC AB 1 CT1 27 (SUTURE) ×4
SUT VIC AB 1 CT1 27XBRD ANTBC (SUTURE) ×4 IMPLANT
SUT VIC AB 2-0 CT1 27 (SUTURE) ×2
SUT VIC AB 2-0 CT1 TAPERPNT 27 (SUTURE) ×2 IMPLANT
SUT VLOC 180 0 24IN GS25 (SUTURE) ×2 IMPLANT
SYR 50ML LL SCALE MARK (SYRINGE) ×2 IMPLANT
TAPE STRIPS DRAPE STRL (GAUZE/BANDAGES/DRESSINGS) ×2 IMPLANT
TRAY FOLEY W/METER SILVER 16FR (SET/KITS/TRAYS/PACK) ×2 IMPLANT
WRAP KNEE MAXI GEL POST OP (GAUZE/BANDAGES/DRESSINGS) ×2 IMPLANT
YANKAUER SUCT BULB TIP 10FT TU (MISCELLANEOUS) ×2 IMPLANT

## 2017-05-28 NOTE — Anesthesia Procedure Notes (Signed)
Procedure Name: LMA Insertion Date/Time: 05/28/2017 2:05 PM Performed by: Lissa Morales, CRNA Pre-anesthesia Checklist: Patient identified, Emergency Drugs available, Suction available and Patient being monitored Patient Re-evaluated:Patient Re-evaluated prior to induction Oxygen Delivery Method: Circle system utilized Preoxygenation: Pre-oxygenation with 100% oxygen Induction Type: IV induction Ventilation: Mask ventilation without difficulty LMA: LMA with gastric port inserted LMA Size: 5.0 Tube type: Oral Number of attempts: 1 Airway Equipment and Method: Oral airway Placement Confirmation: positive ETCO2 Tube secured with: Tape Dental Injury: Teeth and Oropharynx as per pre-operative assessment

## 2017-05-28 NOTE — Anesthesia Preprocedure Evaluation (Addendum)
Anesthesia Evaluation  Patient identified by MRN, date of birth, ID band Patient awake    Reviewed: Allergy & Precautions, H&P , Patient's Chart, lab work & pertinent test results  Airway Mallampati: II  TM Distance: >3 FB Neck ROM: full    Dental no notable dental hx.    Pulmonary    Pulmonary exam normal breath sounds clear to auscultation       Cardiovascular Exercise Tolerance: Good hypertension,  Rhythm:regular Rate:Normal     Neuro/Psych    GI/Hepatic   Endo/Other  diabetes, Type 2  Renal/GU      Musculoskeletal   Abdominal   Peds  Hematology   Anesthesia Other Findings AS- 8mm Gradient  Reproductive/Obstetrics                            Anesthesia Physical Anesthesia Plan  ASA: III  Anesthesia Plan: General   Post-op Pain Management:    Induction:   PONV Risk Score and Plan: Dexamethasone, Ondansetron and Treatment may vary due to age or medical condition  Airway Management Planned:   Additional Equipment:   Intra-op Plan:   Post-operative Plan:   Informed Consent: I have reviewed the patients History and Physical, chart, labs and discussed the procedure including the risks, benefits and alternatives for the proposed anesthesia with the patient or authorized representative who has indicated his/her understanding and acceptance.     Plan Discussed with:   Anesthesia Plan Comments: (  )       Anesthesia Quick Evaluation

## 2017-05-28 NOTE — Transfer of Care (Signed)
Immediate Anesthesia Transfer of Care Note  Patient: Jeremy Nicholson  Procedure(s) Performed: LEFT TOTAL KNEE ARTHROPLASTY (Left Knee)  Patient Location: PACU  Anesthesia Type:General  Level of Consciousness: awake, alert , oriented and patient cooperative  Airway & Oxygen Therapy: Patient Spontanous Breathing and Patient connected to face mask oxygen  Post-op Assessment: Report given to RN, Post -op Vital signs reviewed and stable and Patient moving all extremities X 4  Post vital signs: stable  Last Vitals:  Vitals:   05/28/17 1145  BP: (!) 160/64  Pulse: 62  Resp: 16  Temp: 37.1 C  SpO2: 99%    Last Pain:  Vitals:   05/28/17 1145  TempSrc: Oral      Patients Stated Pain Goal: 3 (98/26/41 5830)  Complications: No apparent anesthesia complications

## 2017-05-28 NOTE — Interval H&P Note (Signed)
History and Physical Interval Note:  05/28/2017 1:52 PM  Jeremy Nicholson  has presented today for surgery, with the diagnosis of Left knee osteoarthritis  The various methods of treatment have been discussed with the patient and family. After consideration of risks, benefits and other options for treatment, the patient has consented to  Procedure(s): LEFT TOTAL KNEE ARTHROPLASTY (Left) as a surgical intervention .  The patient's history has been reviewed, patient examined, no change in status, stable for surgery.  I have reviewed the patient's chart and labs.  Questions were answered to the patient's satisfaction.     Taia Bramlett ANDREW

## 2017-05-28 NOTE — Op Note (Signed)
DATE OF SURGERY:  05/28/2017  TIME: 3:56 PM  PATIENT NAME:  Jeremy Nicholson    AGE: 71 y.o.   PRE-OPERATIVE DIAGNOSIS:  Left knee osteoarthritis  POST-OPERATIVE DIAGNOSIS:  Left knee osteoarthritis  PROCEDURE:  Procedure(s): LEFT TOTAL KNEE ARTHROPLASTY  SURGEON:  Jaxsyn Catalfamo ANDREW  ASSISTANT:  Bryson Stilwell, PA-C, present and scrubbed throughout the case, critical for assistance with exposure, retraction, instrumentation, and closure.  OPERATIVE IMPLANTS: Depuy PFC Sigma Rotating Platform.  Femur size 4, Tibia size 4, Patella size 35 3-peg oval button, with a 10 mm polyethylene insert.   PREOPERATIVE INDICATIONS:   Jeremy Nicholson is a 71 y.o. year old male with end stage bone on bone arthritis of the knee who failed conservative treatment and elected for Total Knee Arthroplasty.   The risks, benefits, and alternatives were discussed at length including but not limited to the risks of infection, bleeding, nerve injury, stiffness, blood clots, the need for revision surgery, cardiopulmonary complications, among others, and they were willing to proceed.  OPERATIVE DESCRIPTION:  The patient was brought to the operative room and placed in a supine position.  Spinal anesthesia was administered.  IV antibiotics were given.  The lower extremity was prepped and draped in the usual sterile fashion.  Time out was performed.  The leg was elevated and exsanguinated and the tourniquet was inflated.  Anterior quadriceps tendon splitting approach was performed.  The patella was retracted and osteophytes were removed.  The anterior horn of the medial and lateral meniscus was removed and cruciate ligaments resected.   The distal femur was opened with the drill and the intramedullary distal femoral cutting jig was utilized, set at 5 degrees resecting 10 mm off the distal femur.  Care was taken to protect the collateral ligaments.  The distal femoral sizing jig was applied, taking care to avoid  notching.  Then the 4-in-1 cutting jig was applied and the anterior and posterior femur was cut, along with the chamfer cuts.    Then the extramedullary tibial cutting jig was utilized making the appropriate cut using the anterior tibial crest as a reference building in appropriate posterior slope.  Care was taken during the cut to protect the medial and collateral ligaments.  The proximal tibia was removed along with the posterior horns of the menisci.   The posterior medial femoral osteophytes and posterior lateral femoral osteophytes were removed.    The flexion gap was then measured and was symmetric with the extension gap, measured at 12.  I completed the distal femoral preparation using the appropriate jig to prepare the box.  The patella was then measured, and cut with the saw.    The proximal tibia sized and prepared accordingly with the reamer and the punch, and then all components were trialed with the trial insert.  The knee was found to have excellent balance and full motion.    The above named components were then cemented into place and all excess cement was removed.  The trial polyethylene component was in place during cementation, and then was exchanged for the real polyethylene component.    The knee was easily taken through a range of motion and the patella tracked well and the knee irrigated copiously and the parapatellar and subcutaneous tissue closed with vicryl, and monocryl with steri strips for the skin.  The arthrotomy was closed at 90 of flexion. The wounds were dressed with sterile gauze and the tourniquet released and the patient was awakened and returned to the PACU in stable  and satisfactory condition.  There were no complications.  Total tourniquet time was 80 minutes.

## 2017-05-29 LAB — BASIC METABOLIC PANEL
Anion gap: 9 (ref 5–15)
BUN: 16 mg/dL (ref 6–20)
CALCIUM: 8.5 mg/dL — AB (ref 8.9–10.3)
CHLORIDE: 104 mmol/L (ref 101–111)
CO2: 23 mmol/L (ref 22–32)
CREATININE: 1.26 mg/dL — AB (ref 0.61–1.24)
GFR calc non Af Amer: 56 mL/min — ABNORMAL LOW (ref >60)
Glucose, Bld: 125 mg/dL — ABNORMAL HIGH (ref 65–99)
Potassium: 4.3 mmol/L (ref 3.5–5.1)
SODIUM: 136 mmol/L (ref 135–145)

## 2017-05-29 LAB — CBC
HCT: 42.6 % (ref 39.0–52.0)
Hemoglobin: 13.9 g/dL (ref 13.0–17.0)
MCH: 29.3 pg (ref 26.0–34.0)
MCHC: 32.6 g/dL (ref 30.0–36.0)
MCV: 89.9 fL (ref 78.0–100.0)
PLATELETS: 244 10*3/uL (ref 150–400)
RBC: 4.74 MIL/uL (ref 4.22–5.81)
RDW: 13.7 % (ref 11.5–15.5)
WBC: 9.4 10*3/uL (ref 4.0–10.5)

## 2017-05-29 LAB — GLUCOSE, CAPILLARY
GLUCOSE-CAPILLARY: 118 mg/dL — AB (ref 65–99)
GLUCOSE-CAPILLARY: 96 mg/dL (ref 65–99)
Glucose-Capillary: 148 mg/dL — ABNORMAL HIGH (ref 65–99)
Glucose-Capillary: 147 mg/dL — ABNORMAL HIGH (ref 65–99)

## 2017-05-29 NOTE — Progress Notes (Signed)
Physical Therapy Treatment Patient Details Name: Jeremy Nicholson MRN: [id number] DOB: 07-19-1945 Today's Date: 05/29/2017    History of Present Illness s/p L TKA    PT Comments    Pt is making excellent progress with ROM, activity tolerance and mobility;  Follow Up Recommendations  DC plan and follow up therapy as arranged by surgeon;Home health PT     Equipment Recommendations  Rolling walker with 5" wheels    Recommendations for Other Services       Precautions / Restrictions Precautions Precautions: Fall;Knee Restrictions Other Position/Activity Restrictions: WBAT    Mobility  Bed Mobility Overal bed mobility: Needs Assistance Bed Mobility: Supine to Sit     Supine to sit: Min assist     General bed mobility comments: in recliner  Transfers Overall transfer level: Needs assistance Equipment used: Rolling walker (2 wheeled) Transfers: Sit to/from Stand Sit to Stand: Supervision         General transfer comment: cues for hand placement and safety  Ambulation/Gait Ambulation/Gait assistance: Supervision;Min guard Ambulation Distance (Feet): 80 Feet Assistive device: Rolling walker (2 wheeled) Gait Pattern/deviations: Step-to pattern;Step-through pattern     General Gait Details: cues for sequence and gait progression   Stairs            Wheelchair Mobility    Modified Rankin (Stroke Patients Only)       Balance                                            Cognition Arousal/Alertness: Awake/alert Behavior During Therapy: WFL for tasks assessed/performed Overall Cognitive Status: Within Functional Limits for tasks assessed                                        Exercises Total Joint Exercises Ankle Circles/Pumps: AROM;Both;10 reps Quad Sets: AROM;Both;10 reps Heel Slides: AROM;AAROM;Left;10 reps Hip ABduction/ADduction: AROM;AAROM;Left;10 reps Straight Leg Raises: AROM;Left;10 reps Goniometric  ROM: ~ 5* to 75* left knee flexion AAROM    General Comments        Pertinent Vitals/Pain Pain Assessment: No/denies pain    Home Living Family/patient expects to be discharged to:: Private residence Living Arrangements: Spouse/significant other Available Help at Discharge: Family         Home Equipment: None      Prior Function Level of Independence: Independent          PT Goals (current goals can now be found in the care plan section) Acute Rehab PT Goals Patient Stated Goal: return to IND PT Goal Formulation: With patient Potential to Achieve Goals: Good Progress towards PT goals: Progressing toward goals    Frequency    7X/week      PT Plan Current plan remains appropriate    Co-evaluation              AM-PAC PT "6 Clicks" Daily Activity  Outcome Measure  Difficulty turning over in bed (including adjusting bedclothes, sheets and blankets)?: A Little Difficulty moving from lying on back to sitting on the side of the bed? : A Little Difficulty sitting down on and standing up from a chair with arms (e.g., wheelchair, bedside commode, etc,.)?: A Little Help needed moving to and from a bed to chair (including a wheelchair)?: A Little Help needed walking in hospital  room?: A Little Help needed climbing 3-5 steps with a railing? : A Little 6 Click Score: 18    End of Session Equipment Utilized During Treatment: Gait belt Activity Tolerance: Patient tolerated treatment well Patient left: with call bell/phone within reach;in chair;with family/visitor present Nurse Communication: Mobility status PT Visit Diagnosis: Muscle weakness (generalized) (M62.81);Difficulty in walking, not elsewhere classified (R26.2)     Time: 0354-6568 PT Time Calculation (min) (ACUTE ONLY): 18 min  Charges:  $Therapeutic Exercise: 8-22 mins                    G Codes:          Jeremy Nicholson 06-11-17, 5:22 PM

## 2017-05-29 NOTE — Progress Notes (Signed)
Subjective: 1 Day Post-Op Procedure(s) (LRB): LEFT TOTAL KNEE ARTHROPLASTY (Left) Patient reports pain as well controlled.  Tolerating PO's. Progress with PT. Denies SOB,CP, or calf pain. Wife at bedside.  Objective: Vital signs in last 24 hours: Temp:  [97.8 F (36.6 C)-99 F (37.2 C)] 98.2 F (36.8 C) (12/29 0526) Pulse Rate:  [62-108] 103 (12/29 0837) Resp:  [11-23] 16 (12/29 0526) BP: (115-173)/(56-85) 157/85 (12/29 0837) SpO2:  [85 %-99 %] 96 % (12/29 0526) Weight:  [80.3 kg (177 lb)] 80.3 kg (177 lb) (12/28 1824)  Intake/Output from previous day: 12/28 0701 - 12/29 0700 In: 2830 [P.O.:480; I.V.:2150; IV Piggyback:200] Out: 2090 [Urine:2040; Blood:50] Intake/Output this shift: Total I/O In: 240 [P.O.:240] Out: -   Recent Labs    05/29/17 0543  HGB 13.9   Recent Labs    05/29/17 0543  WBC 9.4  RBC 4.74  HCT 42.6  PLT 244   Recent Labs    05/29/17 0543  NA 136  K 4.3  CL 104  CO2 23  BUN 16  CREATININE 1.26*  GLUCOSE 125*  CALCIUM 8.5*   No results for input(s): LABPT, INR in the last 72 hours.  Alert and oriented x3. RRR, Lungs clear, BS x4. Left Calf soft and non tender. L knee dressing C/D/I. No DVT signs. No signs of infection or compartment syndrome. LLE grossly neurovascularly intact.   Assessment/Plan: 1 Day Post-Op Procedure(s) (LRB): LEFT TOTAL KNEE ARTHROPLASTY (Left) Up with PT Plan d/c tomorrow Continue current care  Jeremy Nicholson L 05/29/2017, 8:50 AM

## 2017-05-29 NOTE — Evaluation (Signed)
Physical Therapy Evaluation Patient Details Name: Jeremy Nicholson MRN: [id number] DOB: April 24, 1946 Today's Date: 05/29/2017   History of Present Illness  s/p L TKA  Clinical Impression  Pt is s/p TKA resulting in the deficits listed below (see PT Problem List).  Pt will benefit from skilled PT to increase their independence and safety with mobility to allow discharge to the venue listed below.      Follow Up Recommendations DC plan and follow up therapy as arranged by surgeon    Equipment Recommendations  Rolling walker with 5" wheels    Recommendations for Other Services       Precautions / Restrictions Precautions Precautions: Fall;Knee Restrictions Weight Bearing Restrictions: No Other Position/Activity Restrictions: WBAT      Mobility  Bed Mobility Overal bed mobility: Needs Assistance Bed Mobility: Supine to Sit     Supine to sit: Min assist     General bed mobility comments: cues for technique and safety  Transfers Overall transfer level: Needs assistance Equipment used: Rolling walker (2 wheeled) Transfers: Sit to/from Stand Sit to Stand: Min assist         General transfer comment: cues for hand placement and safety  Ambulation/Gait Ambulation/Gait assistance: Min guard;Min assist Ambulation Distance (Feet): 65 Feet Assistive device: Rolling walker (2 wheeled) Gait Pattern/deviations: Step-to pattern;Trunk flexed     General Gait Details: cues for RW position, trunk extension and sequencing, assist to steady at times although no overt LOB  Stairs            Wheelchair Mobility    Modified Rankin (Stroke Patients Only)       Balance                                             Pertinent Vitals/Pain Pain Assessment: No/denies pain    Home Living Family/patient expects to be discharged to:: Private residence Living Arrangements: Spouse/significant other Available Help at Discharge: Family Type of Home:  House Home Access: Stairs to enter   Technical brewer of Steps: 2 Home Layout: Two level;Able to live on main level with bedroom/bathroom Home Equipment: None      Prior Function Level of Independence: Independent               Hand Dominance        Extremity/Trunk Assessment   Upper Extremity Assessment Upper Extremity Assessment: Defer to OT evaluation    Lower Extremity Assessment Lower Extremity Assessment: LLE deficits/detail LLE Deficits / Details: ankle WFL; knee and hip 2+/5, limited by post op pain and weakness       Communication      Cognition Arousal/Alertness: Awake/alert Behavior During Therapy: WFL for tasks assessed/performed Overall Cognitive Status: Within Functional Limits for tasks assessed                                        General Comments      Exercises Total Joint Exercises Ankle Circles/Pumps: AROM;Both;10 reps Quad Sets: AROM;Both;10 reps   Assessment/Plan    PT Assessment Patient needs continued PT services  PT Problem List Decreased strength;Decreased range of motion;Decreased activity tolerance;Decreased mobility;Decreased knowledge of use of DME;Decreased knowledge of precautions       PT Treatment Interventions Gait training;DME instruction;Therapeutic activities;Therapeutic exercise;Patient/family education;Stair training;Functional mobility training  PT Goals (Current goals can be found in the Care Plan section)  Acute Rehab PT Goals Patient Stated Goal: return to IND PT Goal Formulation: With patient Potential to Achieve Goals: Good    Frequency 7X/week   Barriers to discharge        Co-evaluation               AM-PAC PT "6 Clicks" Daily Activity  Outcome Measure Difficulty turning over in bed (including adjusting bedclothes, sheets and blankets)?: A Little Difficulty moving from lying on back to sitting on the side of the bed? : Unable Difficulty sitting down on and  standing up from a chair with arms (e.g., wheelchair, bedside commode, etc,.)?: Unable Help needed moving to and from a bed to chair (including a wheelchair)?: A Little Help needed walking in hospital room?: A Little Help needed climbing 3-5 steps with a railing? : A Little 6 Click Score: 14    End of Session Equipment Utilized During Treatment: Gait belt Activity Tolerance: Patient tolerated treatment well Patient left: in chair;with call bell/phone within reach;with family/visitor present Nurse Communication: Mobility status PT Visit Diagnosis: Muscle weakness (generalized) (M62.81);Difficulty in walking, not elsewhere classified (R26.2)    Time: 8546-2703 PT Time Calculation (min) (ACUTE ONLY): 21 min   Charges:   PT Evaluation $PT Eval Low Complexity: 1 Low     PT G Codes:          Jeremy Nicholson 05-Jun-2017, 1:28 PM

## 2017-05-29 NOTE — Evaluation (Signed)
Occupational Therapy Evaluation Patient Details Name: Jeremy Nicholson MRN: [id number] DOB: 07-04-1945 Today's Date: 05/29/2017    History of Present Illness s/p L TKA   Clinical Impression   This 71 year old man was admitted for the above sx.  All education was completed. No further OT is needed at this time    Follow Up Recommendations  Supervision/Assistance - 24 hour    Equipment Recommendations  3 in 1 bedside commode    Recommendations for Other Services       Precautions / Restrictions Precautions Precautions: Fall;Knee Restrictions Weight Bearing Restrictions: No Other Position/Activity Restrictions: WBAT      Mobility Bed Mobility Overal bed mobility: Needs Assistance Bed Mobility: Supine to Sit     Supine to sit: Min assist     General bed mobility comments: oob  Transfers Overall transfer level: Needs assistance Equipment used: Rolling walker (2 wheeled) Transfers: Sit to/from Stand Sit to Stand: Min guard         General transfer comment: cues for UE/LE placement    Balance                                           ADL either performed or assessed with clinical judgement   ADL Overall ADL's : Needs assistance/impaired             Lower Body Bathing: Minimal assistance;Sit to/from stand       Lower Body Dressing: Moderate assistance;Sit to/from stand   Toilet Transfer: Min guard;Ambulation;BSC;RW   Toileting- Water quality scientist and Hygiene: Min guard;Sit to/from stand   Tub/ Shower Transfer: Walk-in shower;Min guard;Ambulation;3 in 1     General ADL Comments: pt can perform UB adls with set up. Wife present and observed toilet (3:1) and shower transfers.  handout given for shower sequence. She will help with adls as needed. Reviewed precautions     Vision         Perception     Praxis      Pertinent Vitals/Pain Pain Assessment: No/denies pain     Hand Dominance     Extremity/Trunk Assessment  Upper Extremity Assessment Upper Extremity Assessment: Overall WFL for tasks assessed          Communication Communication Communication: No difficulties   Cognition Arousal/Alertness: Awake/alert Behavior During Therapy: WFL for tasks assessed/performed Overall Cognitive Status: Within Functional Limits for tasks assessed                                     General Comments       Exercises Total Joint Exercises Ankle Circles/Pumps: AROM;Both;10 reps Quad Sets: AROM;Both;10 reps   Shoulder Instructions      Home Living Family/patient expects to be discharged to:: Private residence Living Arrangements: Spouse/significant other Available Help at Discharge: Family Type of Home: House Home Access: Stairs to enter [employer] of Steps: 2   Home Layout: Two level;Able to live on main level with bedroom/bathroom     Bathroom Shower/Tub: Occupational psychologist: Standard     Home Equipment: None          Prior Functioning/Environment Level of Independence: Independent                 OT Problem List:        OT  Treatment/Interventions:      OT Goals(Current goals can be found in the care plan section) Acute Rehab OT Goals Patient Stated Goal: return to IND OT Goal Formulation: All assessment and education complete, DC therapy  OT Frequency:     Barriers to D/C:            Co-evaluation              AM-PAC PT "6 Clicks" Daily Activity     Outcome Measure Help from another person eating meals?: None Help from another person taking care of personal grooming?: A Little Help from another person toileting, which includes using toliet, bedpan, or urinal?: A Little Help from another person bathing (including washing, rinsing, drying)?: A Little Help from another person to put on and taking off regular upper body clothing?: A Little Help from another person to put on and taking off regular lower body clothing?: A  Lot 6 Click Score: 18   End of Session    Activity Tolerance: Patient tolerated treatment well Patient left: in chair;with call bell/phone within reach  OT Visit Diagnosis: Pain Pain - Right/Left: Left Pain - part of body: Knee                Time: 8588-5027 OT Time Calculation (min): 13 min Charges:  OT General Charges $OT Visit: 1 Visit OT Evaluation $OT Eval Low Complexity: 1 Low G-Codes:     [place], OTR/L [phone] 05/29/2017  Jeremy Nicholson 05/29/2017, 3:01 PM

## 2017-05-30 ENCOUNTER — Encounter (HOSPITAL_COMMUNITY): Payer: Self-pay | Admitting: Emergency Medicine

## 2017-05-30 ENCOUNTER — Other Ambulatory Visit: Payer: Self-pay

## 2017-05-30 ENCOUNTER — Emergency Department (HOSPITAL_COMMUNITY): Payer: PPO

## 2017-05-30 ENCOUNTER — Inpatient Hospital Stay (HOSPITAL_COMMUNITY)
Admission: EM | Admit: 2017-05-30 | Discharge: 2017-06-03 | DRG: 682 | Disposition: A | Discharge status: Home Health | Payer: PPO | Attending: Family Medicine | Admitting: Family Medicine

## 2017-05-30 DIAGNOSIS — M25562 Pain in left knee: Secondary | ICD-10-CM | POA: Diagnosis not present

## 2017-05-30 DIAGNOSIS — Z96652 Presence of left artificial knee joint: Secondary | ICD-10-CM | POA: Diagnosis not present

## 2017-05-30 DIAGNOSIS — I10 Essential (primary) hypertension: Secondary | ICD-10-CM | POA: Diagnosis present

## 2017-05-30 DIAGNOSIS — M25561 Pain in right knee: Secondary | ICD-10-CM | POA: Diagnosis present

## 2017-05-30 DIAGNOSIS — R000 Tachycardia, unspecified: Secondary | ICD-10-CM | POA: Diagnosis present

## 2017-05-30 DIAGNOSIS — R531 Weakness: Secondary | ICD-10-CM | POA: Diagnosis present

## 2017-05-30 DIAGNOSIS — S8992XA Unspecified injury of left lower leg, initial encounter: Secondary | ICD-10-CM | POA: Diagnosis not present

## 2017-05-30 DIAGNOSIS — N179 Acute kidney failure, unspecified: Principal | ICD-10-CM | POA: Diagnosis present

## 2017-05-30 DIAGNOSIS — Z7982 Long term (current) use of aspirin: Secondary | ICD-10-CM | POA: Diagnosis not present

## 2017-05-30 DIAGNOSIS — Z9181 History of falling: Secondary | ICD-10-CM

## 2017-05-30 DIAGNOSIS — R109 Unspecified abdominal pain: Secondary | ICD-10-CM

## 2017-05-30 DIAGNOSIS — R410 Disorientation, unspecified: Secondary | ICD-10-CM | POA: Diagnosis not present

## 2017-05-30 DIAGNOSIS — M25462 Effusion, left knee: Secondary | ICD-10-CM | POA: Diagnosis not present

## 2017-05-30 DIAGNOSIS — A419 Sepsis, unspecified organism: Secondary | ICD-10-CM

## 2017-05-30 DIAGNOSIS — R6511 Systemic inflammatory response syndrome (SIRS) of non-infectious origin with acute organ dysfunction: Secondary | ICD-10-CM | POA: Diagnosis present

## 2017-05-30 DIAGNOSIS — R300 Dysuria: Secondary | ICD-10-CM | POA: Diagnosis not present

## 2017-05-30 DIAGNOSIS — R509 Fever, unspecified: Secondary | ICD-10-CM

## 2017-05-30 DIAGNOSIS — S0990XA Unspecified injury of head, initial encounter: Secondary | ICD-10-CM | POA: Diagnosis not present

## 2017-05-30 DIAGNOSIS — G8911 Acute pain due to trauma: Secondary | ICD-10-CM | POA: Diagnosis not present

## 2017-05-30 DIAGNOSIS — J9811 Atelectasis: Secondary | ICD-10-CM | POA: Diagnosis not present

## 2017-05-30 DIAGNOSIS — I352 Nonrheumatic aortic (valve) stenosis with insufficiency: Secondary | ICD-10-CM | POA: Diagnosis present

## 2017-05-30 DIAGNOSIS — K5900 Constipation, unspecified: Secondary | ICD-10-CM | POA: Diagnosis not present

## 2017-05-30 DIAGNOSIS — R6510 Systemic inflammatory response syndrome (SIRS) of non-infectious origin without acute organ dysfunction: Secondary | ICD-10-CM | POA: Diagnosis not present

## 2017-05-30 DIAGNOSIS — M7989 Other specified soft tissue disorders: Secondary | ICD-10-CM | POA: Diagnosis not present

## 2017-05-30 DIAGNOSIS — Z7984 Long term (current) use of oral hypoglycemic drugs: Secondary | ICD-10-CM

## 2017-05-30 DIAGNOSIS — E119 Type 2 diabetes mellitus without complications: Secondary | ICD-10-CM | POA: Diagnosis not present

## 2017-05-30 DIAGNOSIS — R5082 Postprocedural fever: Secondary | ICD-10-CM | POA: Diagnosis not present

## 2017-05-30 DIAGNOSIS — S8002XA Contusion of left knee, initial encounter: Secondary | ICD-10-CM | POA: Diagnosis not present

## 2017-05-30 DIAGNOSIS — S299XXA Unspecified injury of thorax, initial encounter: Secondary | ICD-10-CM | POA: Diagnosis not present

## 2017-05-30 DIAGNOSIS — S8991XA Unspecified injury of right lower leg, initial encounter: Secondary | ICD-10-CM | POA: Diagnosis not present

## 2017-05-30 DIAGNOSIS — Z471 Aftercare following joint replacement surgery: Secondary | ICD-10-CM | POA: Diagnosis not present

## 2017-05-30 DIAGNOSIS — S199XXA Unspecified injury of neck, initial encounter: Secondary | ICD-10-CM | POA: Diagnosis not present

## 2017-05-30 DIAGNOSIS — Z9889 Other specified postprocedural states: Secondary | ICD-10-CM | POA: Diagnosis not present

## 2017-05-30 DIAGNOSIS — Z96659 Presence of unspecified artificial knee joint: Secondary | ICD-10-CM

## 2017-05-30 DIAGNOSIS — I361 Nonrheumatic tricuspid (valve) insufficiency: Secondary | ICD-10-CM | POA: Diagnosis not present

## 2017-05-30 LAB — CBC WITH DIFFERENTIAL/PLATELET
Basophils Absolute: 0 10*3/uL (ref 0.0–0.1)
Basophils Relative: 0 %
Eosinophils Absolute: 0 10*3/uL (ref 0.0–0.7)
Eosinophils Relative: 0 %
HEMATOCRIT: 41.6 % (ref 39.0–52.0)
HEMOGLOBIN: 14.2 g/dL (ref 13.0–17.0)
LYMPHS ABS: 1 10*3/uL (ref 0.7–4.0)
LYMPHS PCT: 9 %
MCH: 30.4 pg (ref 26.0–34.0)
MCHC: 34.1 g/dL (ref 30.0–36.0)
MCV: 89.1 fL (ref 78.0–100.0)
MONO ABS: 1.1 10*3/uL — AB (ref 0.1–1.0)
MONOS PCT: 10 %
NEUTROS ABS: 8.2 10*3/uL — AB (ref 1.7–7.7)
NEUTROS PCT: 81 %
Platelets: 223 10*3/uL (ref 150–400)
RBC: 4.67 MIL/uL (ref 4.22–5.81)
RDW: 13.6 % (ref 11.5–15.5)
WBC: 10.2 10*3/uL (ref 4.0–10.5)

## 2017-05-30 LAB — COMPREHENSIVE METABOLIC PANEL
ALBUMIN: 4 g/dL (ref 3.5–5.0)
ALK PHOS: 76 U/L (ref 38–126)
ALT: 17 U/L (ref 17–63)
ANION GAP: 13 (ref 5–15)
AST: 31 U/L (ref 15–41)
BUN: 14 mg/dL (ref 6–20)
CHLORIDE: 100 mmol/L — AB (ref 101–111)
CO2: 21 mmol/L — AB (ref 22–32)
Calcium: 9.4 mg/dL (ref 8.9–10.3)
Creatinine, Ser: 1.25 mg/dL — ABNORMAL HIGH (ref 0.61–1.24)
GFR calc Af Amer: >60 mL/min (ref >60)
GFR calc non Af Amer: 57 mL/min — ABNORMAL LOW (ref >60)
GLUCOSE: 152 mg/dL — AB (ref 65–99)
POTASSIUM: 4.2 mmol/L (ref 3.5–5.1)
SODIUM: 134 mmol/L — AB (ref 135–145)
Total Bilirubin: 1.6 mg/dL — ABNORMAL HIGH (ref 0.3–1.2)
Total Protein: 8.8 g/dL — ABNORMAL HIGH (ref 6.5–8.1)

## 2017-05-30 LAB — URINALYSIS, ROUTINE W REFLEX MICROSCOPIC
BACTERIA UA: NONE SEEN
BILIRUBIN URINE: NEGATIVE
Glucose, UA: >=500 mg/dL — AB
Ketones, ur: 80 mg/dL — AB
Leukocytes, UA: NEGATIVE
Nitrite: NEGATIVE
PROTEIN: 30 mg/dL — AB
SPECIFIC GRAVITY, URINE: 1.024 (ref 1.005–1.030)
SQUAMOUS EPITHELIAL / LPF: NONE SEEN
pH: 5 (ref 5.0–8.0)

## 2017-05-30 LAB — CBC
HCT: 38.6 % — ABNORMAL LOW (ref 39.0–52.0)
Hemoglobin: 12.4 g/dL — ABNORMAL LOW (ref 13.0–17.0)
MCH: 29 pg (ref 26.0–34.0)
MCHC: 32.1 g/dL (ref 30.0–36.0)
MCV: 90.4 fL (ref 78.0–100.0)
Platelets: 206 K/uL (ref 150–400)
RBC: 4.27 MIL/uL (ref 4.22–5.81)
RDW: 13.8 % (ref 11.5–15.5)
WBC: 7.9 K/uL (ref 4.0–10.5)

## 2017-05-30 LAB — I-STAT CG4 LACTIC ACID, ED: LACTIC ACID, VENOUS: 1.77 mmol/L (ref 0.5–1.9)

## 2017-05-30 LAB — GLUCOSE, CAPILLARY
GLUCOSE-CAPILLARY: 80 mg/dL (ref 65–99)
GLUCOSE-CAPILLARY: 130 mg/dL — AB (ref 65–99)

## 2017-05-30 MED ORDER — VANCOMYCIN HCL IN DEXTROSE 1-5 GM/200ML-% IV SOLN
1000.0000 mg | Freq: Once | INTRAVENOUS | Status: DC
  Filled 2017-05-30: qty 200

## 2017-05-30 MED ORDER — PIPERACILLIN-TAZOBACTAM 3.375 G IVPB 30 MIN
3.3750 g | Freq: Once | INTRAVENOUS | Status: AC
  Administered 2017-05-30: 3.375 g via INTRAVENOUS
  Filled 2017-05-30: qty 50

## 2017-05-30 MED ORDER — VANCOMYCIN HCL 10 G IV SOLR
1750.0000 mg | Freq: Once | INTRAVENOUS | Status: AC
  Administered 2017-05-30: 1750 mg via INTRAVENOUS
  Filled 2017-05-30: qty 750

## 2017-05-30 MED ORDER — SODIUM CHLORIDE 0.9 % IV BOLUS (SEPSIS)
1000.0000 mL | Freq: Once | INTRAVENOUS | Status: AC
  Administered 2017-05-30: 1000 mL via INTRAVENOUS

## 2017-05-30 MED ORDER — ACETAMINOPHEN 500 MG PO TABS
1000.0000 mg | ORAL_TABLET | Freq: Once | ORAL | Status: AC
  Administered 2017-05-30: 1000 mg via ORAL
  Filled 2017-05-30: qty 2

## 2017-05-30 NOTE — Progress Notes (Signed)
A consult was received from an ED physician for zosyn and vancomycin per pharmacy dosing.  The patient's profile has been reviewed for ht/wt/allergies/indication/available labs.   A one time order has been placed for zosyn 3.375 gm and Vancomycin 1750 mg.  Further antibiotics/pharmacy consults should be ordered by admitting physician if indicated.                       Thank you, Dorrene German 05/30/2017  10:50 PM

## 2017-05-30 NOTE — ED Notes (Signed)
Bed: RESA Expected date:  Expected time:  Means of arrival:  Comments: fall

## 2017-05-30 NOTE — Progress Notes (Signed)
Subjective: 2 Days Post-Op Procedure(s) (LRB): LEFT TOTAL KNEE ARTHROPLASTY (Left) Patient reports pain as 2 on 0-10 scale.    Objective: Vital signs in last 24 hours: Temp:  [97.8 F (36.6 C)-98.4 F (36.9 C)] 98.4 F (36.9 C) (12/30 0541) Pulse Rate:  [71-103] 95 (12/30 0541) Resp:  [16-17] 16 (12/30 0541) BP: (134-157)/(63-85) 134/72 (12/30 0541) SpO2:  [95 %-96 %] 96 % (12/30 0541)  Intake/Output from previous day: 12/29 0701 - 12/30 0700 In: 1080 [P.O.:1080] Out: 200 [Urine:200] Intake/Output this shift: No intake/output data recorded.  Recent Labs    05/29/17 0543 05/30/17 0527  HGB 13.9 12.4*   Recent Labs    05/29/17 0543 05/30/17 0527  WBC 9.4 7.9  RBC 4.74 4.27  HCT 42.6 38.6*  PLT 244 206   Recent Labs    05/29/17 0543  NA 136  K 4.3  CL 104  CO2 23  BUN 16  CREATININE 1.26*  GLUCOSE 125*  CALCIUM 8.5*   No results for input(s): LABPT, INR in the last 72 hours.  Neurologically intact Sensation intact distally Dorsiflexion/Plantar flexion intact Incision: dressing C/D/I No DVT  Assessment/Plan: 2 Days Post-Op Procedure(s) (LRB): LEFT TOTAL KNEE ARTHROPLASTY (Left) Advance diet Up with therapy D/C IV fluids Discharge home with home health  Instr given  Jeremy Nicholson C 05/30/2017, 8:17 AM

## 2017-05-30 NOTE — ED Triage Notes (Signed)
Pt BIB EMS s/p fall complaining of knee pain with positive deformity. No blood thinners. Patient has fallen multiple times today landing on the bed. Latest fall patient fell to the floor, hitting his head on the wall. Positive knee pain and deformity, no abrasions to face. Skin tear on left elbow. Patient recently had left knee replacement, and is febrile on arrival.

## 2017-05-30 NOTE — Progress Notes (Signed)
Received referral to assist pt with HHPT and DME (RW and 3-in-1 BSC). Met with pt and wife. D/C plan is for pt to return home with the support of his wife. Pt has medical coverage through the [place]. Contacted Jermaine at [facility] for referral. [facility] is not able to accept the HHPT referral because pt needs authorization for the therapy through the [place]. Unable to contact the [place] for authorization. Jermaine accepted the referral for the DME. Explained to pt and wife that [facility] is not able to accept the referral until we get approval from the [place] for the therapy. Will discuss HH needs with CM manager. They agreed.

## 2017-05-30 NOTE — Progress Notes (Signed)
Physical Therapy Treatment Patient Details Name: Jeremy Nicholson MRN: [id number] DOB: 11-Jul-1945 Today's Date: 05/30/2017    History of Present Illness s/p L TKA    PT Comments    Pt making excellent progress; will benefit from HHPT   Follow Up Recommendations  DC plan and follow up therapy as arranged by surgeon;Home health PT     Equipment Recommendations  Rolling walker with 5" wheels    Recommendations for Other Services       Precautions / Restrictions Precautions Precautions: Fall;Knee Restrictions Weight Bearing Restrictions: No Other Position/Activity Restrictions: WBAT    Mobility  Bed Mobility Overal bed mobility: Needs Assistance Bed Mobility: Supine to Sit     Supine to sit: Min guard     General bed mobility comments: min/guard LLE to bring off bed  Transfers Overall transfer level: Needs assistance Equipment used: Rolling walker (2 wheeled) Transfers: Sit to/from Stand Sit to Stand: Supervision;Modified independent (Device/Increase time)         General transfer comment: cues for hand placement and safety  Ambulation/Gait Ambulation/Gait assistance: Supervision Ambulation Distance (Feet): 60 Feet Assistive device: Rolling walker (2 wheeled) Gait Pattern/deviations: Step-to pattern;Step-through pattern     General Gait Details: cues for sequence and gait progression   Stairs            Wheelchair Mobility    Modified Rankin (Stroke Patients Only)       Balance                                            Cognition Arousal/Alertness: Awake/alert Behavior During Therapy: WFL for tasks assessed/performed Overall Cognitive Status: Within Functional Limits for tasks assessed                                        Exercises Total Joint Exercises Ankle Circles/Pumps: AROM;Both;10 reps Quad Sets: AROM;Both;10 reps Heel Slides: AROM;AAROM;Left;10 reps Hip ABduction/ADduction:  AROM;AAROM;Left;10 reps Straight Leg Raises: AROM;Left;10 reps Goniometric ROM: ` 5* to 80* AAROM L knee    General Comments        Pertinent Vitals/Pain Pain Assessment: 0-10 Pain Score: 2  Pain Location: left knee Pain Descriptors / Indicators: Guarding;Discomfort Pain Intervention(s): Limited activity within patient's tolerance;Monitored during session;Premedicated before session;Repositioned;Ice applied    Home Living                      Prior Function            PT Goals (current goals can now be found in the care plan section) Acute Rehab PT Goals Patient Stated Goal: return to IND PT Goal Formulation: With patient Potential to Achieve Goals: Good Progress towards PT goals: Progressing toward goals    Frequency    7X/week      PT Plan Current plan remains appropriate    Co-evaluation              AM-PAC PT "6 Clicks" Daily Activity  Outcome Measure  Difficulty turning over in bed (including adjusting bedclothes, sheets and blankets)?: A Little Difficulty moving from lying on back to sitting on the side of the bed? : A Little Difficulty sitting down on and standing up from a chair with arms (e.g., wheelchair, bedside commode, etc,.)?: A Little Help needed moving  to and from a bed to chair (including a wheelchair)?: A Little Help needed walking in hospital room?: A Little Help needed climbing 3-5 steps with a railing? : A Little 6 Click Score: 18    End of Session Equipment Utilized During Treatment: Gait belt Activity Tolerance: Patient tolerated treatment well Patient left: with call bell/phone within reach;in chair;with family/visitor present   PT Visit Diagnosis: Muscle weakness (generalized) (M62.81);Difficulty in walking, not elsewhere classified (R26.2)     Time: 1001-1037 PT Time Calculation (min) (ACUTE ONLY): 36 min  Charges:  $Gait Training: 8-22 mins $Therapeutic Exercise: 8-22 mins                    G Codes:           Tobechukwu Emmick 05/30/2017, 10:43 AM

## 2017-05-30 NOTE — ED Notes (Signed)
Urine culture sent for save to lab.

## 2017-05-30 NOTE — ED Provider Notes (Signed)
[place] DEPT Provider Note   CSN: [id number] Arrival date & time: 05/30/17  2054     History   Chief Complaint Chief Complaint  Patient presents with  . Fall    Right knee  . Fever    HPI Jeremy Nicholson is a 71 y.o. male.  Patient is a 71 year old male with a history of a recent total knee replacement on 1228 who presents with fever and confusion.  He was discharged today from [facility] following the knee replacement.  Reportedly he was doing well at that time.  His wife states that since he has been home he has had some trouble with ambulation and issues with his balance.  She had noted this in the hospital as well.  He also has had some increased confusion today and subjective fever.  They states that he has felt really hot but have not checked his temperature.  There is been no cough or congestion.  No speech deficit.  No vision changes.  They say that he has been overall weak but no unilateral weakness.  No difficulty with urination.  He has had 2 falls since he has been home which they attribute to his difficulty with his balance and weakness.  At one point he fell backward and hit his head.  He is complaining of some increased pain to his left knee where he had the replacement since falling.  He also had had some pain in his right knee following a fall.  He currently denies that.  Patient denies any chest pain or shortness of breath.  He is noted to be markedly tachycardic on arrival.      Past Medical History:  Diagnosis Date  . Arthritis   . Diabetes mellitus without complication ([place])   . Heart murmur   . History of hiatal hernia   . Hypertension     Patient Active Problem List   Diagnosis Date Noted  . 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 COLONOSCOPY    . JOINT REPLACEMENT Left 05/28/2017       Home Medications    Prior to Admission medications   Medication Sig Start  Date End Date Taking? Authorizing Provider  amLODipine (NORVASC) 10 MG tablet Take 10 mg by mouth daily.   Yes [provider]  capsaicin (ZOSTRIX) 0.025 % cream Apply 1 application topically 2 (two) times daily as needed (for knee).   Yes [provider]  Cholecalciferol 1000 units tablet Take 1,000 Units by mouth daily.   Yes [provider]  empagliflozin (JARDIANCE) 10 MG TABS tablet Take 10 mg by mouth daily.   Yes [provider]  glipiZIDE (GLUCOTROL) 10 MG tablet Take 10 mg by mouth 2 (two) times daily.   Yes [provider]  ibuprofen (ADVIL,MOTRIN) 600 MG tablet Take 600 mg by mouth every 6 (six) hours as needed for headache or moderate pain.   Yes [provider]  metFORMIN (GLUCOPHAGE) 500 MG tablet Take 1,000 mg by mouth 2 (two) times daily with a meal.   Yes [provider]  metoprolol tartrate (LOPRESSOR) 50 MG tablet Take 50 mg by mouth daily.   Yes [provider]  niacin (SLO-NIACIN) 500 MG tablet Take 500 mg by mouth daily.   Yes [provider]  tamsulosin (FLOMAX) 0.4 MG CAPS capsule Take 0.4 mg by mouth at bedtime.   Yes [provider]  aspirin EC 325  MG tablet Take 1 tablet (325 mg total) by mouth 2 (two) times daily. 05/28/17 05/28/18  Stilwell, Sueanne Margarita, PA-C  methocarbamol (ROBAXIN) 500 MG tablet Take 1 tablet (500 mg total) by mouth every 8 (eight) hours as needed for muscle spasms. 05/28/17   Stilwell, Bryson L, PA-C  oxyCODONE (ROXICODONE) 5 MG immediate release tablet Take 1 tablet (5 mg total) by mouth every 4 (four) hours as needed. 05/28/17 05/28/18  Lajean Manes, PA-C    Family History History reviewed. No pertinent family history.  Social History Social History   Tobacco Use  . Smoking status: Never Smoker  . Smokeless tobacco: Never Used  Substance Use Topics  . Alcohol use: No    Frequency: Never  . Drug use: No     Allergies   Patient has no known  allergies.   Review of Systems Review of Systems  Constitutional: Positive for fatigue and fever. Negative for chills and diaphoresis.  HENT: Negative for congestion, rhinorrhea and sneezing.   Eyes: Negative.   Respiratory: Negative for cough, chest tightness and shortness of breath.   Cardiovascular: Negative for chest pain and leg swelling.  Gastrointestinal: Negative for abdominal pain, blood in stool, diarrhea, nausea and vomiting.  Genitourinary: Negative for difficulty urinating, flank pain, frequency and hematuria.  Musculoskeletal: Positive for arthralgias. Negative for back pain.  Skin: Negative for rash.  Neurological: Negative for dizziness, speech difficulty, weakness, numbness and headaches.       "off balance"  Psychiatric/Behavioral: Positive for confusion.     Physical Exam Updated Vital Signs BP (!) 161/73   Pulse (!) 111   Temp (!) 102.3 F (39.1 C) (Rectal)   Resp 20   Ht 5\' 9"  (1.753 m)   Wt 81.6 kg (180 lb)   SpO2 95%   BMI 26.58 kg/m   Physical Exam  Constitutional: He appears well-developed and well-nourished.  HENT:  Head: Normocephalic and atraumatic.  Eyes: Pupils are equal, round, and reactive to light.  Neck: Normal range of motion. Neck supple.  No pain along the spine  Cardiovascular: Regular rhythm. Tachycardia present.  Murmur heard. Pulmonary/Chest: Effort normal and breath sounds normal. No respiratory distress. He has no wheezes. He has no rales. He exhibits no tenderness.  Abdominal: Soft. Bowel sounds are normal. There is no tenderness. There is no rebound and no guarding.  Musculoskeletal: Normal range of motion. He exhibits edema.  Positive anterior incision along the left knee.  There is a dressing in place.  There is some mild warmth and erythema the knee.  There is marked swelling around the knee.  There is no pain to the hip or ankle.  Patient has some mild pain on palpation of the right anterior knee but no swelling or  deformity is noted.  No other pain on palpation or range of motion of the extremities.  Lymphadenopathy:    He has no cervical adenopathy.  Neurological: He is alert.  Patient is alert and answers questions but is confused at times.  He has symmetrical movement in all extremities.  His left leg is limited due to his recent knee surgery although he has normal dorsiflexion and plantarflexion of his feet and is symmetric bilaterally.  He has normal motor strength in his upper extremities.  He has normal sensation to light touch in all extremities.  No facial drooping.  Skin: Skin is warm and dry. No rash noted.  Psychiatric: He has a normal mood and affect.     ED  Treatments / Results  Labs (all labs ordered are listed, but only abnormal results are displayed) Labs Reviewed  COMPREHENSIVE METABOLIC PANEL - Abnormal; Notable for the following components:      Result Value   Sodium 134 (*)    Chloride 100 (*)    CO2 21 (*)    Glucose, Bld 152 (*)    Creatinine, Ser 1.25 (*)    Total Protein 8.8 (*)    Total Bilirubin 1.6 (*)    GFR calc non Af Amer 57 (*)    All other components within normal limits  CBC WITH DIFFERENTIAL/PLATELET - Abnormal; Notable for the following components:   Neutro Abs 8.2 (*)    Monocytes Absolute 1.1 (*)    All other components within normal limits  URINALYSIS, ROUTINE W REFLEX MICROSCOPIC - Abnormal; Notable for the following components:   Glucose, UA >=500 (*)    Hgb urine dipstick MODERATE (*)    Ketones, ur 80 (*)    Protein, ur 30 (*)    All other components within normal limits  CULTURE, BLOOD (ROUTINE X 2)  CULTURE, BLOOD (ROUTINE X 2)  URINE CULTURE  I-STAT CG4 LACTIC ACID, ED    EKG  EKG Interpretation  Date/Time:  Sunday May 30 2017 22:04:32 EST Ventricular Rate:  129 PR Interval:    QRS Duration: 90 QT Interval:  315 QTC Calculation: 462 R Axis:   64 Text Interpretation:  Sinus tachycardia Ventricular premature complex LAE,  consider biatrial enlargement Borderline T abnormalities, inferior leads No old tracing to compare Confirmed by Malvin Johns [phone]) on 05/30/2017 10:24:37 PM       Radiology Dg Chest 2 View  Result Date: 05/31/2017 CLINICAL DATA:  Status post fall, with concern for chest injury. Initial encounter. EXAM: CHEST  2 VIEW COMPARISON:  None. FINDINGS: The lungs are well-aerated. There is mild elevation of the left hemidiaphragm. There is no evidence of focal opacification, pleural effusion or pneumothorax. The heart is normal in size; the mediastinal contour is within normal limits. No acute osseous abnormalities are seen. IMPRESSION: Mild elevation of the left hemidiaphragm. Lungs remain grossly clear. Electronically Signed   By: Garald Balding M.D.   On: 05/31/2017 00:03   Ct Head Wo Contrast  Result Date: 05/31/2017 CLINICAL DATA:  71 y/o  M; multiple falls tonight with head injury. EXAM: CT HEAD WITHOUT CONTRAST CT CERVICAL SPINE WITHOUT CONTRAST TECHNIQUE: Multidetector CT imaging of the head and cervical spine was performed following the standard protocol without intravenous contrast. Multiplanar CT image reconstructions of the cervical spine were also generated. COMPARISON:  None. FINDINGS: CT HEAD FINDINGS Brain: No evidence of acute infarction, hemorrhage, hydrocephalus, extra-axial collection or mass lesion/mass effect. Mild chronic microvascular ischemic changes and parenchymal volume loss of the brain. Vascular: Calcific atherosclerosis of carotid siphons. Skull: Normal. Negative for fracture or focal lesion. Sinuses/Orbits: Mild diffuse paranasal sinus mucosal thickening. Normal aeration of mastoid air cells. Orbits are unremarkable. Other: None. CT CERVICAL SPINE FINDINGS Alignment: Normal. Skull base and vertebrae: No acute fracture. No primary bone lesion or focal pathologic process. Soft tissues and spinal canal: No prevertebral fluid or swelling. No visible canal hematoma. Disc levels:  Cervical spondylosis with mild multilevel discogenic degenerative changes, right C7-T1 and left upper cervical facet arthropathy. Upper chest: Mild biapical paraseptal emphysema. Other: Extensive calcific atherosclerosis of carotid siphons. IMPRESSION: 1. No acute intracranial abnormality or calvarial fracture. 2. No acute fracture or dislocation of cervical spine. 3. Mild chronic microvascular ischemic changes and  parenchymal volume loss of the brain. 4. Mild paranasal sinus disease. 5. Mild cervical spondylosis.  No high-grade bony canal stenosis. Electronically Signed   By: Kristine Garbe M.D.   On: 05/31/2017 00:31   Ct Cervical Spine Wo Contrast  Result Date: 05/31/2017 CLINICAL DATA:  71 y/o  M; multiple falls tonight with head injury. EXAM: CT HEAD WITHOUT CONTRAST CT CERVICAL SPINE WITHOUT CONTRAST TECHNIQUE: Multidetector CT imaging of the head and cervical spine was performed following the standard protocol without intravenous contrast. Multiplanar CT image reconstructions of the cervical spine were also generated. COMPARISON:  None. FINDINGS: CT HEAD FINDINGS Brain: No evidence of acute infarction, hemorrhage, hydrocephalus, extra-axial collection or mass lesion/mass effect. Mild chronic microvascular ischemic changes and parenchymal volume loss of the brain. Vascular: Calcific atherosclerosis of carotid siphons. Skull: Normal. Negative for fracture or focal lesion. Sinuses/Orbits: Mild diffuse paranasal sinus mucosal thickening. Normal aeration of mastoid air cells. Orbits are unremarkable. Other: None. CT CERVICAL SPINE FINDINGS Alignment: Normal. Skull base and vertebrae: No acute fracture. No primary bone lesion or focal pathologic process. Soft tissues and spinal canal: No prevertebral fluid or swelling. No visible canal hematoma. Disc levels: Cervical spondylosis with mild multilevel discogenic degenerative changes, right C7-T1 and left upper cervical facet arthropathy. Upper chest:  Mild biapical paraseptal emphysema. Other: Extensive calcific atherosclerosis of carotid siphons. IMPRESSION: 1. No acute intracranial abnormality or calvarial fracture. 2. No acute fracture or dislocation of cervical spine. 3. Mild chronic microvascular ischemic changes and parenchymal volume loss of the brain. 4. Mild paranasal sinus disease. 5. Mild cervical spondylosis.  No high-grade bony canal stenosis. Electronically Signed   By: Kristine Garbe M.D.   On: 05/31/2017 00:31   Dg Knee Complete 4 Views Left  Result Date: 05/31/2017 CLINICAL DATA:  Status post fall, with left knee pain and deformity. Recent left knee replacement. Fever and left knee swelling and erythema. EXAM: LEFT KNEE - COMPLETE 4+ VIEW COMPARISON:  None. FINDINGS: There is no evidence of acute fracture or dislocation. The total knee arthroplasty appears grossly intact, without evidence of loosening. Prominent soft tissue air is seen tracking about the lateral aspect of the knee, along the lateral lower leg, and along the medial aspect of the left thigh. This may simply be postoperative in nature. Would correlate clinically for evidence of infection with a gas producing organism. A small knee joint effusion is noted. Scattered vascular calcifications are seen. Diffuse soft tissue swelling is noted about the knee and lower thigh. IMPRESSION: 1. No evidence of fracture or dislocation. Total knee arthroplasty appears intact. 2. Prominent soft tissue air about the lateral aspect of the knee, along the lateral lower leg, and along the medial aspect of the left thigh. This may simply be postoperative in nature. Would correlate clinically for evidence of infection with a gas producing organism, or for signs of necrotizing fasciitis. 3. Small knee joint effusion. Diffuse soft tissue swelling about the knee and lower thigh. 4. Scattered vascular calcifications seen. These results were called by telephone at the time of interpretation on  05/31/2017 at 12:13 am to Dr. Threasa Beards Carmeline Kowal, who verbally acknowledged these results. Electronically Signed   By: Garald Balding M.D.   On: 05/31/2017 00:15   Dg Knee Complete 4 Views Right  Result Date: 05/30/2017 CLINICAL DATA:  Fall today. Knee pain and swelling. Initial encounter. EXAM: RIGHT KNEE - COMPLETE 4+ VIEW COMPARISON:  None. FINDINGS: No evidence of fracture, dislocation, or joint effusion. Moderate osteoarthritis involving the medial compartment.  Enthesopathic changes seen involving the patella and anterior tibial tubercle. Peripheral vascular calcification also noted. IMPRESSION: No acute findings. Moderate medial compartment osteoarthritis. Electronically Signed   By: Earle Gell M.D.   On: 05/30/2017 21:35    Procedures Procedures (including critical care time)  Medications Ordered in ED Medications  vancomycin (VANCOCIN) 1,750 mg in sodium chloride 0.9 % 500 mL IVPB (1,750 mg Intravenous New Bag/Given 05/30/17 2321)  piperacillin-tazobactam (ZOSYN) IVPB 3.375 g (0 g Intravenous Stopped 05/30/17 2320)  sodium chloride 0.9 % bolus 1,000 mL (0 mLs Intravenous Stopped 05/31/17 0016)  acetaminophen (TYLENOL) tablet 1,000 mg (1,000 mg Oral Given 05/30/17 2248)  fentaNYL (SUBLIMAZE) injection 50 mcg (50 mcg Intravenous Given 05/31/17 0038)     Initial Impression / Assessment and Plan / ED Course  I have reviewed the triage vital signs and the nursing notes.  Pertinent labs & imaging results that were available during my care of the patient were reviewed by me and considered in my medical decision making (see chart for details).     Patient is a 78-year-old male who had a recent total knee replacement left knee 2 days ago.  He reports today with fever, increased confusion, generalized weakness and falls x2.  His head CT is negative.  He had a fever of 102 on arrival.  I do not find any definitive source for infection.  His chest x-ray is clear without evidence of pneumonia.  His  urinalysis is not indicative of infection.  His white count is normal.  His lactate is normal.  However he did meet sepsis criteria on arrival and was treated with IV fluids as well as Zosyn and vancomycin.  He does have swelling, warmth and erythema of the left knee as well as some air in the soft tissue space on x-ray.  This could represent infection versus typical postoperative changes.  I discussed these findings with Dr. Tonita Cong with [facility] orthopedics.  He is coming in to evaluate the patient.  Patient will likely need to be admitted.  Is yet to be determined if he will be admitted by orthopedics or the hospitalist.  I will also do a CT angio of the chest to assess for pulmonary embolus given his tachycardia, fever and postoperative state.  However he currently has no hypoxia, shortness of breath or chest pain.  Dr. Stark Jock to follow up on CT and assist with ultimate admission to either ortho service of Hospitalist.  Dr. Alcario Drought with the hospitalist service is aware of the pt should he need to admit.  Final Clinical Impressions(s) / ED Diagnoses   Final diagnoses:  Sepsis, due to unspecified organism [facility])  Confusion    ED Discharge Orders    None       Malvin Johns, MD 05/31/17 [phone]

## 2017-05-30 NOTE — Anesthesia Postprocedure Evaluation (Signed)
Anesthesia Post Note  Patient: Jeremy Nicholson  Procedure(s) Performed: LEFT TOTAL KNEE ARTHROPLASTY (Left Knee)     Patient location during evaluation: PACU Anesthesia Type: General Level of consciousness: awake and alert Pain management: pain level controlled Vital Signs Assessment: post-procedure vital signs reviewed and stable Respiratory status: spontaneous breathing, nonlabored ventilation, respiratory function stable and patient connected to nasal cannula oxygen Cardiovascular status: blood pressure returned to baseline and stable Postop Assessment: no apparent nausea or vomiting Anesthetic complications: yes Anesthetic complication details: post-op delerium in PACU and anesthesia complicationsComments: Awake and alert; delerium cleared up quickly with Precedex    Last Vitals:  Vitals:   05/29/17 2117 05/30/17 0541  BP: (!) 146/77 134/72  Pulse: 81 95  Resp: 16 16  Temp: 36.7 C 36.9 C  SpO2: 95% 96%    Last Pain:  Vitals:   05/30/17 0825  TempSrc:   PainSc: [place]

## 2017-05-31 ENCOUNTER — Observation Stay (HOSPITAL_COMMUNITY): Payer: PPO

## 2017-05-31 ENCOUNTER — Observation Stay (HOSPITAL_BASED_OUTPATIENT_CLINIC_OR_DEPARTMENT_OTHER): Payer: PPO

## 2017-05-31 ENCOUNTER — Encounter (HOSPITAL_COMMUNITY): Payer: Self-pay

## 2017-05-31 ENCOUNTER — Emergency Department (HOSPITAL_COMMUNITY): Payer: PPO

## 2017-05-31 DIAGNOSIS — R5082 Postprocedural fever: Secondary | ICD-10-CM | POA: Diagnosis not present

## 2017-05-31 DIAGNOSIS — R6510 Systemic inflammatory response syndrome (SIRS) of non-infectious origin without acute organ dysfunction: Secondary | ICD-10-CM | POA: Diagnosis present

## 2017-05-31 DIAGNOSIS — I10 Essential (primary) hypertension: Secondary | ICD-10-CM | POA: Diagnosis present

## 2017-05-31 DIAGNOSIS — M25462 Effusion, left knee: Secondary | ICD-10-CM | POA: Diagnosis present

## 2017-05-31 DIAGNOSIS — E119 Type 2 diabetes mellitus without complications: Secondary | ICD-10-CM | POA: Diagnosis present

## 2017-05-31 DIAGNOSIS — M25562 Pain in left knee: Secondary | ICD-10-CM | POA: Diagnosis present

## 2017-05-31 DIAGNOSIS — Z7982 Long term (current) use of aspirin: Secondary | ICD-10-CM | POA: Diagnosis not present

## 2017-05-31 DIAGNOSIS — Z9889 Other specified postprocedural states: Secondary | ICD-10-CM

## 2017-05-31 DIAGNOSIS — J9811 Atelectasis: Secondary | ICD-10-CM | POA: Diagnosis present

## 2017-05-31 DIAGNOSIS — K5900 Constipation, unspecified: Secondary | ICD-10-CM | POA: Diagnosis present

## 2017-05-31 DIAGNOSIS — R531 Weakness: Secondary | ICD-10-CM | POA: Diagnosis present

## 2017-05-31 DIAGNOSIS — N179 Acute kidney failure, unspecified: Secondary | ICD-10-CM | POA: Diagnosis present

## 2017-05-31 DIAGNOSIS — R6511 Systemic inflammatory response syndrome (SIRS) of non-infectious origin with acute organ dysfunction: Secondary | ICD-10-CM | POA: Diagnosis present

## 2017-05-31 DIAGNOSIS — Z96652 Presence of left artificial knee joint: Secondary | ICD-10-CM

## 2017-05-31 DIAGNOSIS — Z7984 Long term (current) use of oral hypoglycemic drugs: Secondary | ICD-10-CM | POA: Diagnosis not present

## 2017-05-31 DIAGNOSIS — M25561 Pain in right knee: Secondary | ICD-10-CM | POA: Diagnosis present

## 2017-05-31 DIAGNOSIS — I352 Nonrheumatic aortic (valve) stenosis with insufficiency: Secondary | ICD-10-CM | POA: Diagnosis present

## 2017-05-31 DIAGNOSIS — R509 Fever, unspecified: Secondary | ICD-10-CM | POA: Diagnosis present

## 2017-05-31 DIAGNOSIS — Z471 Aftercare following joint replacement surgery: Secondary | ICD-10-CM | POA: Diagnosis not present

## 2017-05-31 DIAGNOSIS — R410 Disorientation, unspecified: Secondary | ICD-10-CM | POA: Diagnosis present

## 2017-05-31 DIAGNOSIS — I361 Nonrheumatic tricuspid (valve) insufficiency: Secondary | ICD-10-CM | POA: Diagnosis not present

## 2017-05-31 DIAGNOSIS — Z9181 History of falling: Secondary | ICD-10-CM | POA: Diagnosis not present

## 2017-05-31 DIAGNOSIS — R000 Tachycardia, unspecified: Secondary | ICD-10-CM | POA: Diagnosis present

## 2017-05-31 DIAGNOSIS — R300 Dysuria: Secondary | ICD-10-CM | POA: Diagnosis present

## 2017-05-31 HISTORY — DX: Systemic inflammatory response syndrome (sirs) of non-infectious origin without acute organ dysfunction: R65.10

## 2017-05-31 LAB — CBC WITH DIFFERENTIAL/PLATELET
BASOS PCT: 0 %
Basophils Absolute: 0 10*3/uL (ref 0.0–0.1)
EOS PCT: 1 %
Eosinophils Absolute: 0.1 10*3/uL (ref 0.0–0.7)
HEMATOCRIT: 38.4 % — AB (ref 39.0–52.0)
Hemoglobin: 12.7 g/dL — ABNORMAL LOW (ref 13.0–17.0)
Lymphocytes Relative: 13 %
Lymphs Abs: 1.3 10*3/uL (ref 0.7–4.0)
MCH: 29.7 pg (ref 26.0–34.0)
MCHC: 33.1 g/dL (ref 30.0–36.0)
MCV: 89.9 fL (ref 78.0–100.0)
MONO ABS: 1.7 10*3/uL — AB (ref 0.1–1.0)
MONOS PCT: 17 %
NEUTROS ABS: 6.9 10*3/uL (ref 1.7–7.7)
Neutrophils Relative %: 69 %
PLATELETS: 212 10*3/uL (ref 150–400)
RBC: 4.27 MIL/uL (ref 4.22–5.81)
RDW: 13.7 % (ref 11.5–15.5)
WBC: 10 10*3/uL (ref 4.0–10.5)

## 2017-05-31 LAB — GLUCOSE, CAPILLARY
GLUCOSE-CAPILLARY: 106 mg/dL — AB (ref 65–99)
GLUCOSE-CAPILLARY: 122 mg/dL — AB (ref 65–99)
GLUCOSE-CAPILLARY: 115 mg/dL — AB (ref 65–99)
Glucose-Capillary: 140 mg/dL — ABNORMAL HIGH (ref 65–99)
Glucose-Capillary: 100 mg/dL — ABNORMAL HIGH (ref 65–99)
Glucose-Capillary: 116 mg/dL — ABNORMAL HIGH (ref 65–99)

## 2017-05-31 LAB — BASIC METABOLIC PANEL
Anion gap: 13 (ref 5–15)
BUN: 12 mg/dL (ref 6–20)
CALCIUM: 8.8 mg/dL — AB (ref 8.9–10.3)
CHLORIDE: 104 mmol/L (ref 101–111)
CO2: 19 mmol/L — AB (ref 22–32)
CREATININE: 1.16 mg/dL (ref 0.61–1.24)
GFR calc Af Amer: >60 mL/min (ref >60)
GFR calc non Af Amer: >60 mL/min (ref >60)
Glucose, Bld: 110 mg/dL — ABNORMAL HIGH (ref 65–99)
Potassium: 4.2 mmol/L (ref 3.5–5.1)
SODIUM: 136 mmol/L (ref 135–145)

## 2017-05-31 LAB — BETA-HYDROXYBUTYRIC ACID: Beta-Hydroxybutyric Acid: 3.66 mmol/L — ABNORMAL HIGH (ref 0.05–0.27)

## 2017-05-31 MED ORDER — TAMSULOSIN HCL 0.4 MG PO CAPS
0.4000 mg | ORAL_CAPSULE | Freq: Every day | ORAL | Status: DC
  Administered 2017-05-31 – 2017-06-02 (×3): 0.4 mg via ORAL
  Filled 2017-05-31 (×3): qty 1

## 2017-05-31 MED ORDER — IOPAMIDOL (ISOVUE-370) INJECTION 76%
100.0000 mL | Freq: Once | INTRAVENOUS | Status: AC | PRN
  Administered 2017-05-31: 100 mL via INTRAVENOUS

## 2017-05-31 MED ORDER — ACETAMINOPHEN 325 MG PO TABS
650.0000 mg | ORAL_TABLET | Freq: Four times a day (QID) | ORAL | Status: DC | PRN
  Administered 2017-06-02: 650 mg via ORAL
  Filled 2017-05-31: qty 2

## 2017-05-31 MED ORDER — SENNOSIDES-DOCUSATE SODIUM 8.6-50 MG PO TABS
2.0000 | ORAL_TABLET | Freq: Once | ORAL | Status: AC
  Administered 2017-05-31: 2 via ORAL
  Filled 2017-05-31: qty 2

## 2017-05-31 MED ORDER — METHOCARBAMOL 500 MG PO TABS
500.0000 mg | ORAL_TABLET | Freq: Three times a day (TID) | ORAL | Status: DC | PRN
  Administered 2017-05-31 – 2017-06-03 (×6): 500 mg via ORAL
  Filled 2017-05-31 (×6): qty 1

## 2017-05-31 MED ORDER — OXYCODONE HCL 5 MG PO TABS
5.0000 mg | ORAL_TABLET | ORAL | Status: DC | PRN
  Administered 2017-05-31 – 2017-06-03 (×15): 5 mg via ORAL
  Filled 2017-05-31 (×16): qty 1

## 2017-05-31 MED ORDER — PIPERACILLIN-TAZOBACTAM 3.375 G IVPB
3.3750 g | Freq: Three times a day (TID) | INTRAVENOUS | Status: DC
Start: 2017-05-31 — End: 2017-06-03
  Administered 2017-05-31 – 2017-06-03 (×10): 3.375 g via INTRAVENOUS
  Filled 2017-05-31 (×9): qty 50

## 2017-05-31 MED ORDER — SODIUM CHLORIDE 0.9 % IV SOLN
INTRAVENOUS | Status: DC
  Administered 2017-05-31 – 2017-06-03 (×7): via INTRAVENOUS

## 2017-05-31 MED ORDER — ONDANSETRON HCL 4 MG PO TABS: 4.0000 mg | ORAL_TABLET | Freq: Four times a day (QID) | ORAL | Status: DC | PRN

## 2017-05-31 MED ORDER — ACETAMINOPHEN 650 MG RE SUPP
650.0000 mg | Freq: Four times a day (QID) | RECTAL | Status: DC | PRN
Start: 2017-05-31 — End: 2017-06-03

## 2017-05-31 MED ORDER — INSULIN ASPART 100 UNIT/ML ~~LOC~~ SOLN
0.0000 [IU] | SUBCUTANEOUS | Status: DC
  Administered 2017-05-31 – 2017-06-01 (×3): 1 [IU] via SUBCUTANEOUS
  Administered 2017-06-01: 3 [IU] via SUBCUTANEOUS
  Administered 2017-06-02 (×3): 2 [IU] via SUBCUTANEOUS
  Administered 2017-06-02 – 2017-06-03 (×2): 1 [IU] via SUBCUTANEOUS
  Administered 2017-06-03: 2 [IU] via SUBCUTANEOUS

## 2017-05-31 MED ORDER — IOPAMIDOL (ISOVUE-370) INJECTION 76%
INTRAVENOUS | Status: AC
Start: 2017-05-31 — End: 2017-05-31
  Filled 2017-05-31: qty 100

## 2017-05-31 MED ORDER — NIACIN ER 500 MG PO TBCR
500.0000 mg | EXTENDED_RELEASE_TABLET | Freq: Every day | ORAL | Status: DC
  Administered 2017-05-31 – 2017-06-03 (×4): 500 mg via ORAL
  Filled 2017-05-31 (×4): qty 1

## 2017-05-31 MED ORDER — ENOXAPARIN SODIUM 40 MG/0.4ML ~~LOC~~ SOLN
40.0000 mg | SUBCUTANEOUS | Status: DC
  Administered 2017-05-31 – 2017-06-03 (×4): 40 mg via SUBCUTANEOUS
  Filled 2017-05-31 (×4): qty 0.4

## 2017-05-31 MED ORDER — ONDANSETRON HCL 4 MG/2ML IJ SOLN: 4.0000 mg | Freq: Four times a day (QID) | INTRAMUSCULAR | Status: DC | PRN

## 2017-05-31 MED ORDER — AMLODIPINE BESYLATE 5 MG PO TABS: 10.0000 mg | ORAL_TABLET | Freq: Every day | ORAL | Status: DC

## 2017-05-31 MED ORDER — FENTANYL CITRATE (PF) 100 MCG/2ML IJ SOLN
50.0000 ug | Freq: Once | INTRAMUSCULAR | Status: AC
  Administered 2017-05-31: 50 ug via INTRAVENOUS
  Filled 2017-05-31: qty 2

## 2017-05-31 MED ORDER — ASPIRIN EC 325 MG PO TBEC
325.0000 mg | DELAYED_RELEASE_TABLET | Freq: Two times a day (BID) | ORAL | Status: DC
  Administered 2017-05-31 – 2017-06-03 (×7): 325 mg via ORAL
  Filled 2017-05-31 (×7): qty 1

## 2017-05-31 MED ORDER — SODIUM CHLORIDE 0.9 % IV SOLN
1250.0000 mg | INTRAVENOUS | Status: DC
  Administered 2017-05-31 – 2017-06-02 (×3): 1250 mg via INTRAVENOUS
  Filled 2017-05-31 (×3): qty 1250

## 2017-05-31 MED ORDER — METOPROLOL TARTRATE 25 MG PO TABS
50.0000 mg | ORAL_TABLET | Freq: Every day | ORAL | Status: DC
  Administered 2017-05-31 – 2017-06-03 (×4): 50 mg via ORAL
  Filled 2017-05-31 (×4): qty 2

## 2017-05-31 NOTE — Progress Notes (Signed)
Wife to bring knee immobilizer from home.

## 2017-05-31 NOTE — Progress Notes (Signed)
Spoke with patient regarding issue with previous admission and auth for HHPT through [place]. He states [facility] today and [facility] would [facility]. Contacted Dr. Theda Sers office who states Josem Kaufmann was never requested per their records. She states she will contact Dr. Theda Sers to complete the auth with [facility] today. Contacted [facility] to notify them of patient and pending status.

## 2017-05-31 NOTE — H&P (Signed)
History and Physical    Jeremy Nicholson XTK:[id number] DOB: 11-05-1945 DOA: 05/30/2017  PCP: [name], MD  Patient coming from: Home  I have personally briefly reviewed patient's old medical records in [place]  Chief Complaint: Fever  HPI: Jeremy Nicholson is a 71 y.o. male with medical history significant of recent left TKA on 12/28.  Patient presents to the ED with fever and generalized weakness.  He was doing well after the knee replacement.  Some troubles with ambulation / balance since then.  Today had increased confusion and fever per wife.  Weakness is generalized and non-focal.  No cough, no SOB, no dysuria, no abd pain, no headache, no CP, does have some tenderness and pain to L knee.  ED Course: Tm 102.3 rectally in ED.  CTA PE is neg for PE or PNA, shows some atelectasis.  UA neg for UTI, does show 80 keytones; however, no anion gap and bicarb is 21.  Lactate 1.77.  Initially tachycardic to 130s, improves to 110 after 1L NS.  Put on empiric zosyn and vanc.   Review of Systems: As per HPI otherwise 10 point review of systems negative.   Past Medical History:  Diagnosis Date  . Arthritis   . Diabetes mellitus without complication ([place])   . Heart murmur   . History of hiatal hernia   . Hypertension     Past Surgical History:  Procedure Laterality Date  . COLONOSCOPY    . JOINT REPLACEMENT Left 05/28/2017     reports that  has never smoked. he has never used smokeless tobacco. He reports that he does not drink alcohol or use drugs.  No Known Allergies  History reviewed. No pertinent family history. No illness in family recently.  Prior to Admission medications   Medication Sig Start Date End Date Taking? Authorizing Provider  amLODipine (NORVASC) 10 MG tablet Take 10 mg by mouth daily.   Yes [provider]  capsaicin (ZOSTRIX) 0.025 % cream Apply 1 application topically 2 (two) times daily as needed (for knee).   Yes [provider]   Cholecalciferol 1000 units tablet Take 1,000 Units by mouth daily.   Yes [provider]  empagliflozin (JARDIANCE) 10 MG TABS tablet Take 10 mg by mouth daily.   Yes [provider]  glipiZIDE (GLUCOTROL) 10 MG tablet Take 10 mg by mouth 2 (two) times daily.   Yes [provider]  ibuprofen (ADVIL,MOTRIN) 600 MG tablet Take 600 mg by mouth every 6 (six) hours as needed for headache or moderate pain.   Yes [provider]  metFORMIN (GLUCOPHAGE) 500 MG tablet Take 1,000 mg by mouth 2 (two) times daily with a meal.   Yes [provider]  metoprolol tartrate (LOPRESSOR) 50 MG tablet Take 50 mg by mouth daily.   Yes [provider]  niacin (SLO-NIACIN) 500 MG tablet Take 500 mg by mouth daily.   Yes [provider]  tamsulosin (FLOMAX) 0.4 MG CAPS capsule Take 0.4 mg by mouth at bedtime.   Yes [provider]  aspirin EC 325 MG tablet Take 1 tablet (325 mg total) by mouth 2 (two) times daily. 05/28/17 05/28/18  Stilwell, Sueanne Margarita, PA-C  methocarbamol (ROBAXIN) 500 MG tablet Take 1 tablet (500 mg total) by mouth every 8 (eight) hours as needed for muscle spasms. 05/28/17   Stilwell, Bryson L, PA-C  oxyCODONE (ROXICODONE) 5 MG immediate release tablet Take 1 tablet (5 mg total) by mouth every 4 (four) hours  as needed. 05/28/17 05/28/18  Lajean Manes, PA-C    Physical Exam: Vitals:   05/30/17 2300 05/31/17 0000 05/31/17 0030 05/31/17 0100  BP: (!) 154/76 (!) 164/77 (!) 161/73 (!) 151/73  Pulse: (!) 123 (!) 116 (!) 111 (!) 107  Resp: (!) 23 19 20 16   Temp:      TempSrc:      SpO2: 96% 94% 95% 96%  Weight:      Height:        Constitutional: NAD, calm, comfortable Eyes: PERRL, lids and conjunctivae normal ENMT: Mucous membranes are moist. Posterior pharynx clear of any exudate or lesions.Normal dentition.  Neck: normal, supple, no masses, no thyromegaly Respiratory: clear to auscultation bilaterally, no wheezing, no  crackles. Normal respiratory effort. No accessory muscle use.  Cardiovascular: Tachycardic, regular, no murmurs / rubs / gallops. No extremity edema. 2+ pedal pulses. No carotid bruits.  Abdomen: no tenderness, no masses palpated. No hepatosplenomegaly. Bowel sounds positive.  Musculoskeletal: No obvious redness nor purulent discharge of L knee.  No skin lesions elsewhere, no diabetic foot ulcer. Skin: no rashes, lesions, ulcers. No induration Neurologic: CN 2-12 grossly intact. Sensation intact, DTR normal. Strength 5/5 in all 4.  Psychiatric: Normal judgment and insight. Alert and oriented x 3. Normal mood.    Labs on Admission: I have personally reviewed following labs and imaging studies  CBC: Recent Labs  Lab 05/29/17 0543 05/30/17 0527 05/30/17 2219  WBC 9.4 7.9 10.2  NEUTROABS  --   --  8.2*  HGB 13.9 12.4* 14.2  HCT 42.6 38.6* 41.6  MCV 89.9 90.4 89.1  PLT 244 206 824   Basic Metabolic Panel: Recent Labs  Lab 05/29/17 0543 05/30/17 2219  NA 136 134*  K 4.3 4.2  CL 104 100*  CO2 23 21*  GLUCOSE 125* 152*  BUN 16 14  CREATININE 1.26* 1.25*  CALCIUM 8.5* 9.4   GFR: Estimated Creatinine Clearance: 55 mL/min (A) (by C-G formula based on SCr of 1.25 mg/dL (H)). Liver Function Tests: Recent Labs  Lab 05/30/17 2219  AST 31  ALT 17  ALKPHOS 76  BILITOT 1.6*  PROT 8.8*  ALBUMIN 4.0   No results for input(s): LIPASE, AMYLASE in the last 168 hours. No results for input(s): AMMONIA in the last 168 hours. Coagulation Profile: No results for input(s): INR, PROTIME in the last 168 hours. Cardiac Enzymes: No results for input(s): CKTOTAL, CKMB, CKMBINDEX, TROPONINI in the last 168 hours. BNP (last 3 results) No results for input(s): PROBNP in the last 8760 hours. HbA1C: No results for input(s): HGBA1C in the last 72 hours. CBG: Recent Labs  Lab 05/29/17 1213 05/29/17 1715 05/29/17 2113 05/30/17 0818 05/30/17 1215  GLUCAP 148* 147* 96 80 130*   Lipid  Profile: No results for input(s): CHOL, HDL, LDLCALC, TRIG, CHOLHDL, LDLDIRECT in the last 72 hours. Thyroid Function Tests: No results for input(s): TSH, T4TOTAL, FREET4, T3FREE, THYROIDAB in the last 72 hours. Anemia Panel: No results for input(s): VITAMINB12, FOLATE, FERRITIN, TIBC, IRON, RETICCTPCT in the last 72 hours. Urine analysis:    Component Value Date/Time   COLORURINE YELLOW 05/30/2017 2232   APPEARANCEUR CLEAR 05/30/2017 2232   LABSPEC 1.024 05/30/2017 2232   PHURINE 5.0 05/30/2017 2232   GLUCOSEU >=500 (A) 05/30/2017 2232   HGBUR MODERATE (A) 05/30/2017 2232   BILIRUBINUR NEGATIVE 05/30/2017 2232   KETONESUR 80 (A) 05/30/2017 2232   PROTEINUR 30 (A) 05/30/2017 2232   NITRITE NEGATIVE 05/30/2017 2232   LEUKOCYTESUR NEGATIVE 05/30/2017  2232    Radiological Exams on Admission: Dg Chest 2 View  Result Date: 05/31/2017 CLINICAL DATA:  Status post fall, with concern for chest injury. Initial encounter. EXAM: CHEST  2 VIEW COMPARISON:  None. FINDINGS: The lungs are well-aerated. There is mild elevation of the left hemidiaphragm. There is no evidence of focal opacification, pleural effusion or pneumothorax. The heart is normal in size; the mediastinal contour is within normal limits. No acute osseous abnormalities are seen. IMPRESSION: Mild elevation of the left hemidiaphragm. Lungs remain grossly clear. Electronically Signed   By: Garald Balding M.D.   On: 05/31/2017 00:03   Ct Head Wo Contrast  Result Date: 05/31/2017 CLINICAL DATA:  71 y/o  M; multiple falls tonight with head injury. EXAM: CT HEAD WITHOUT CONTRAST CT CERVICAL SPINE WITHOUT CONTRAST TECHNIQUE: Multidetector CT imaging of the head and cervical spine was performed following the standard protocol without intravenous contrast. Multiplanar CT image reconstructions of the cervical spine were also generated. COMPARISON:  None. FINDINGS: CT HEAD FINDINGS Brain: No evidence of acute infarction, hemorrhage,  hydrocephalus, extra-axial collection or mass lesion/mass effect. Mild chronic microvascular ischemic changes and parenchymal volume loss of the brain. Vascular: Calcific atherosclerosis of carotid siphons. Skull: Normal. Negative for fracture or focal lesion. Sinuses/Orbits: Mild diffuse paranasal sinus mucosal thickening. Normal aeration of mastoid air cells. Orbits are unremarkable. Other: None. CT CERVICAL SPINE FINDINGS Alignment: Normal. Skull base and vertebrae: No acute fracture. No primary bone lesion or focal pathologic process. Soft tissues and spinal canal: No prevertebral fluid or swelling. No visible canal hematoma. Disc levels: Cervical spondylosis with mild multilevel discogenic degenerative changes, right C7-T1 and left upper cervical facet arthropathy. Upper chest: Mild biapical paraseptal emphysema. Other: Extensive calcific atherosclerosis of carotid siphons. IMPRESSION: 1. No acute intracranial abnormality or calvarial fracture. 2. No acute fracture or dislocation of cervical spine. 3. Mild chronic microvascular ischemic changes and parenchymal volume loss of the brain. 4. Mild paranasal sinus disease. 5. Mild cervical spondylosis.  No high-grade bony canal stenosis. Electronically Signed   By: Kristine Garbe M.D.   On: 05/31/2017 00:31   Ct Angio Chest Pe W And/or Wo Contrast  Result Date: 05/31/2017 CLINICAL DATA:  Fever, weakness, tachycardia, elevated white cell count after total knee replacement. History of hypertension and diabetes. EXAM: CT ANGIOGRAPHY CHEST WITH CONTRAST TECHNIQUE: Multidetector CT imaging of the chest was performed using the standard protocol during bolus administration of intravenous contrast. Multiplanar CT image reconstructions and MIPs were obtained to evaluate the vascular anatomy. CONTRAST:  100 mL Isovue 370 COMPARISON:  Chest radiograph 05/30/2017 FINDINGS: Cardiovascular: Good opacification of the central and segmental pulmonary arteries. No  focal filling defects. No evidence of significant pulmonary embolus. Normal heart size. No pericardial effusion. Coronary artery calcifications. Normal caliber thoracic aorta. No dissection. Great vessel origins are patent. Aortic calcification. Mediastinum/Nodes: Esophagus is decompressed. No significant mediastinal lymphadenopathy. There is an enlarged right axillary lymph node measuring 14 mm short axis dimension. Etiology is nonspecific. This could represent inflammatory node although metastatic disease or lymphoma could also have this appearance. Lungs/Pleura: Atelectasis in the lung bases. No consolidation or edema. Mild emphysematous changes in the upper lungs. No pleural effusions. No pneumothorax. Airways are patent. Upper Abdomen: No acute abnormality. Musculoskeletal: No chest wall abnormality. No acute or significant osseous findings. Diffuse degenerative changes of the thoracic spine with bridging anterior osteophytes. Review of the MIP images confirms the above findings. IMPRESSION: 1. No evidence of significant pulmonary embolus. 2. Coronary artery calcifications. 3.  Nonspecific 14 mm enlarged lymph node in the right axilla. Physical examination may be useful in further evaluation. 4. Atelectasis in the lung bases. No evidence of active pulmonary disease. Mild pulmonary emphysema. Aortic Atherosclerosis (ICD10-I70.0) and Emphysema (ICD10-J43.9). Electronically Signed   By: Lucienne Capers M.D.   On: 05/31/2017 01:42   Ct Cervical Spine Wo Contrast  Result Date: 05/31/2017 CLINICAL DATA:  71 y/o  M; multiple falls tonight with head injury. EXAM: CT HEAD WITHOUT CONTRAST CT CERVICAL SPINE WITHOUT CONTRAST TECHNIQUE: Multidetector CT imaging of the head and cervical spine was performed following the standard protocol without intravenous contrast. Multiplanar CT image reconstructions of the cervical spine were also generated. COMPARISON:  None. FINDINGS: CT HEAD FINDINGS Brain: No evidence of acute  infarction, hemorrhage, hydrocephalus, extra-axial collection or mass lesion/mass effect. Mild chronic microvascular ischemic changes and parenchymal volume loss of the brain. Vascular: Calcific atherosclerosis of carotid siphons. Skull: Normal. Negative for fracture or focal lesion. Sinuses/Orbits: Mild diffuse paranasal sinus mucosal thickening. Normal aeration of mastoid air cells. Orbits are unremarkable. Other: None. CT CERVICAL SPINE FINDINGS Alignment: Normal. Skull base and vertebrae: No acute fracture. No primary bone lesion or focal pathologic process. Soft tissues and spinal canal: No prevertebral fluid or swelling. No visible canal hematoma. Disc levels: Cervical spondylosis with mild multilevel discogenic degenerative changes, right C7-T1 and left upper cervical facet arthropathy. Upper chest: Mild biapical paraseptal emphysema. Other: Extensive calcific atherosclerosis of carotid siphons. IMPRESSION: 1. No acute intracranial abnormality or calvarial fracture. 2. No acute fracture or dislocation of cervical spine. 3. Mild chronic microvascular ischemic changes and parenchymal volume loss of the brain. 4. Mild paranasal sinus disease. 5. Mild cervical spondylosis.  No high-grade bony canal stenosis. Electronically Signed   By: Kristine Garbe M.D.   On: 05/31/2017 00:31   Dg Knee Complete 4 Views Left  Result Date: 05/31/2017 CLINICAL DATA:  Status post fall, with left knee pain and deformity. Recent left knee replacement. Fever and left knee swelling and erythema. EXAM: LEFT KNEE - COMPLETE 4+ VIEW COMPARISON:  None. FINDINGS: There is no evidence of acute fracture or dislocation. The total knee arthroplasty appears grossly intact, without evidence of loosening. Prominent soft tissue air is seen tracking about the lateral aspect of the knee, along the lateral lower leg, and along the medial aspect of the left thigh. This may simply be postoperative in nature. Would correlate clinically for  evidence of infection with a gas producing organism. A small knee joint effusion is noted. Scattered vascular calcifications are seen. Diffuse soft tissue swelling is noted about the knee and lower thigh. IMPRESSION: 1. No evidence of fracture or dislocation. Total knee arthroplasty appears intact. 2. Prominent soft tissue air about the lateral aspect of the knee, along the lateral lower leg, and along the medial aspect of the left thigh. This may simply be postoperative in nature. Would correlate clinically for evidence of infection with a gas producing organism, or for signs of necrotizing fasciitis. 3. Small knee joint effusion. Diffuse soft tissue swelling about the knee and lower thigh. 4. Scattered vascular calcifications seen. These results were called by telephone at the time of interpretation on 05/31/2017 at 12:13 am to Dr. Threasa Beards Jeremy Nicholson, who verbally acknowledged these results. Electronically Signed   By: Garald Balding M.D.   On: 05/31/2017 00:15   Dg Knee Complete 4 Views Right  Result Date: 05/30/2017 CLINICAL DATA:  Fall today. Knee pain and swelling. Initial encounter. EXAM: RIGHT KNEE - COMPLETE 4+ VIEW  COMPARISON:  None. FINDINGS: No evidence of fracture, dislocation, or joint effusion. Moderate osteoarthritis involving the medial compartment. Enthesopathic changes seen involving the patella and anterior tibial tubercle. Peripheral vascular calcification also noted. IMPRESSION: No acute findings. Moderate medial compartment osteoarthritis. Electronically Signed   By: Earle Gell M.D.   On: 05/30/2017 21:35    EKG: Independently reviewed.  Assessment/Plan Principal Problem:   SIRS (systemic inflammatory response syndrome) (HCC) Active Problems:   S/P knee replacement   DM2 (diabetes mellitus, type 2) (HCC)   HTN (hypertension)    1. SIRS - 1. Unclear source: 2. CT chest shows small amount of actelectasis, no PNA, no PE 3. UA neg 4. BCx pending 5. Knee doesn't look infected or  bad per Dr. Marlou Sa who is at bedside 1. Repeat X ray at 0700 6. Will get BLE venous duplex to look for DVTs 7. IVF: 1L bolus then 125 cc/hr 8. Continue empiric zosyn / vanc for now 9. Tylenol PRN fever 2. HTN - 1. Will leave on lopressor and tamsulosin 2. And hold amlodipine in setting of SIRS 3. But if anything, think his BP will be running high (currently 314H systolic this AM) 3. DM2 - 1. Hold home PO hypoglycemics 2. Sensitive scale SSI Q4H 3. Checking beta-hydroxy buteric acid level given 80 keytones in urine 4. But patient has no anion gap, and bicarb 21, so if there is DKA present it is likely very mild and doesn't explain his presenting symptoms today.  DVT prophylaxis: SCDs only per Dr. Marlou Sa, in case needs surgery Code Status: Full Family Communication: Wife at bedside Disposition Plan: Home after admit Consults called: Dr. Marlou Sa at bedside Admission status: Place in [place], [place] [facility] Pager [phone]  If 7AM-7PM, please contact day team taking care of patient [email or web address] Password [facility]  05/31/2017, 3:34 AM

## 2017-05-31 NOTE — Progress Notes (Signed)
Pharmacy Antibiotic Note  Adolphus Hanf is a 71 y.o. male admitted on 05/30/2017 with sepsis.  Pharmacy has been consulted for zosyn and vancomycin dosing.  Plan: Zosyn 3.375g IV q8h (4 hour infusion).  Vancomycin 1750 mg x1 then 1250 mg IV q24h for est AUC=472 Daily Scr F/u cultures/levels  Height: 5\' 9"  (175.3 cm) Weight: 180 lb (81.6 kg) IBW/kg (Calculated) : 70.7  Temp (24hrs), Avg:100.1 F (37.8 C), Min:98.4 F (36.9 C), Max:102.3 F (39.1 C)  Recent Labs  Lab 05/29/17 0543 05/30/17 0527 05/30/17 2219 05/30/17 2226  WBC 9.4 7.9 10.2  --   CREATININE 1.26*  --  1.25*  --   LATICACIDVEN  --   --   --  1.77    Estimated Creatinine Clearance: 55 mL/min (A) (by C-G formula based on SCr of 1.25 mg/dL (H)).    No Known Allergies  Antimicrobials this admission: 12/31 zosyn >>  12/31 vancomycin >>   Dose adjustments this admission:   Microbiology results:  BCx:   UCx:    Sputum:    MRSA PCR:   Thank you for allowing pharmacy to be a part of this patient's care.  Dorrene German 05/31/2017 3:09 AM

## 2017-05-31 NOTE — Progress Notes (Signed)
Bilateral lower extremity venous duplex has been completed. Negative for DVT.  05/31/17 9:42 AM Jeremy Nicholson RVT

## 2017-05-31 NOTE — Consult Note (Signed)
Reason for Consult:Fever Referring Physician: EDP  Jeremy Nicholson is an 71 y.o. male.  HPI: Patient s/p TKR left on 12/28. D/C today doing well. At home legs gave out felt confused. Brought to ER.  Past Medical History:  Diagnosis Date  . Arthritis   . Diabetes mellitus without complication ([place])   . Heart murmur   . History of hiatal hernia   . Hypertension     Past Surgical History:  Procedure Laterality Date  . COLONOSCOPY    . JOINT REPLACEMENT Left 05/28/2017    History reviewed. No pertinent family history.  Social History:  reports that  has never smoked. he has never used smokeless tobacco. He reports that he does not drink alcohol or use drugs.  Allergies: No Known Allergies  Medications: I have reviewed the patient's current medications.  Results for orders placed or performed during the hospital encounter of 05/30/17 (from the past 48 hour(s))  Comprehensive metabolic panel     Status: Abnormal   Collection Time: 05/30/17 10:19 PM  Result Value Ref Range   Sodium 134 (L) 135 - 145 mmol/L   Potassium 4.2 3.5 - 5.1 mmol/L   Chloride 100 (L) 101 - 111 mmol/L   CO2 21 (L) 22 - 32 mmol/L   Glucose, Bld 152 (H) 65 - 99 mg/dL   BUN 14 6 - 20 mg/dL   Creatinine, Ser 1.25 (H) 0.61 - 1.24 mg/dL   Calcium 9.4 8.9 - 10.3 mg/dL   Total Protein 8.8 (H) 6.5 - 8.1 g/dL   Albumin 4.0 3.5 - 5.0 g/dL   AST 31 15 - 41 U/L   ALT 17 17 - 63 U/L   Alkaline Phosphatase 76 38 - 126 U/L   Total Bilirubin 1.6 (H) 0.3 - 1.2 mg/dL   GFR calc non Af Amer 57 (L) >60 mL/min   GFR calc Af Amer >60 >60 mL/min    Comment: (NOTE) The eGFR has been calculated using the CKD EPI equation. This calculation has not been validated in all clinical situations. eGFR's persistently <60 mL/min signify possible Chronic Kidney Disease.    Anion gap 13 5 - 15  CBC WITH DIFFERENTIAL     Status: Abnormal   Collection Time: 05/30/17 10:19 PM  Result Value Ref Range   WBC 10.2 4.0 - 10.5 K/uL    RBC 4.67 4.22 - 5.81 MIL/uL   Hemoglobin 14.2 13.0 - 17.0 g/dL   HCT 41.6 39.0 - 52.0 %   MCV 89.1 78.0 - 100.0 fL   MCH 30.4 26.0 - 34.0 pg   MCHC 34.1 30.0 - 36.0 g/dL   RDW 13.6 11.5 - 15.5 %   Platelets 223 150 - 400 K/uL   Neutrophils Relative % 81 %   Neutro Abs 8.2 (H) 1.7 - 7.7 K/uL   Lymphocytes Relative 9 %   Lymphs Abs 1.0 0.7 - 4.0 K/uL   Monocytes Relative 10 %   Monocytes Absolute 1.1 (H) 0.1 - 1.0 K/uL   Eosinophils Relative 0 %   Eosinophils Absolute 0.0 0.0 - 0.7 K/uL   Basophils Relative 0 %   Basophils Absolute 0.0 0.0 - 0.1 K/uL  I-Stat CG4 Lactic Acid, ED  (not at  [facility])     Status: None   Collection Time: 05/30/17 10:26 PM  Result Value Ref Range   Lactic Acid, Venous 1.77 0.5 - 1.9 mmol/L  Urinalysis, Routine w reflex microscopic     Status: Abnormal   Collection Time: 05/30/17 10:32 PM  Result Value Ref Range   Color, Urine YELLOW YELLOW   APPearance CLEAR CLEAR   Specific Gravity, Urine 1.024 1.005 - 1.030   pH 5.0 5.0 - 8.0   Glucose, UA >=500 (A) NEGATIVE mg/dL   Hgb urine dipstick MODERATE (A) NEGATIVE   Bilirubin Urine NEGATIVE NEGATIVE   Ketones, ur 80 (A) NEGATIVE mg/dL   Protein, ur 30 (A) NEGATIVE mg/dL   Nitrite NEGATIVE NEGATIVE   Leukocytes, UA NEGATIVE NEGATIVE   RBC / HPF 0-5 0 - 5 RBC/hpf   WBC, UA 0-5 0 - 5 WBC/hpf   Bacteria, UA NONE SEEN NONE SEEN   Squamous Epithelial / LPF NONE SEEN NONE SEEN   Mucus PRESENT     Dg Chest 2 View  Result Date: 05/31/2017 CLINICAL DATA:  Status post fall, with concern for chest injury. Initial encounter. EXAM: CHEST  2 VIEW COMPARISON:  None. FINDINGS: The lungs are well-aerated. There is mild elevation of the left hemidiaphragm. There is no evidence of focal opacification, pleural effusion or pneumothorax. The heart is normal in size; the mediastinal contour is within normal limits. No acute osseous abnormalities are seen. IMPRESSION: Mild elevation of the left hemidiaphragm. Lungs remain grossly  clear. Electronically Signed   By: Garald Balding M.D.   On: 05/31/2017 00:03   Ct Head Wo Contrast  Result Date: 05/31/2017 CLINICAL DATA:  71 y/o  M; multiple falls tonight with head injury. EXAM: CT HEAD WITHOUT CONTRAST CT CERVICAL SPINE WITHOUT CONTRAST TECHNIQUE: Multidetector CT imaging of the head and cervical spine was performed following the standard protocol without intravenous contrast. Multiplanar CT image reconstructions of the cervical spine were also generated. COMPARISON:  None. FINDINGS: CT HEAD FINDINGS Brain: No evidence of acute infarction, hemorrhage, hydrocephalus, extra-axial collection or mass lesion/mass effect. Mild chronic microvascular ischemic changes and parenchymal volume loss of the brain. Vascular: Calcific atherosclerosis of carotid siphons. Skull: Normal. Negative for fracture or focal lesion. Sinuses/Orbits: Mild diffuse paranasal sinus mucosal thickening. Normal aeration of mastoid air cells. Orbits are unremarkable. Other: None. CT CERVICAL SPINE FINDINGS Alignment: Normal. Skull base and vertebrae: No acute fracture. No primary bone lesion or focal pathologic process. Soft tissues and spinal canal: No prevertebral fluid or swelling. No visible canal hematoma. Disc levels: Cervical spondylosis with mild multilevel discogenic degenerative changes, right C7-T1 and left upper cervical facet arthropathy. Upper chest: Mild biapical paraseptal emphysema. Other: Extensive calcific atherosclerosis of carotid siphons. IMPRESSION: 1. No acute intracranial abnormality or calvarial fracture. 2. No acute fracture or dislocation of cervical spine. 3. Mild chronic microvascular ischemic changes and parenchymal volume loss of the brain. 4. Mild paranasal sinus disease. 5. Mild cervical spondylosis.  No high-grade bony canal stenosis. Electronically Signed   By: Kristine Garbe M.D.   On: 05/31/2017 00:31   Ct Angio Chest Pe W And/or Wo Contrast  Result Date:  05/31/2017 CLINICAL DATA:  Fever, weakness, tachycardia, elevated white cell count after total knee replacement. History of hypertension and diabetes. EXAM: CT ANGIOGRAPHY CHEST WITH CONTRAST TECHNIQUE: Multidetector CT imaging of the chest was performed using the standard protocol during bolus administration of intravenous contrast. Multiplanar CT image reconstructions and MIPs were obtained to evaluate the vascular anatomy. CONTRAST:  100 mL Isovue 370 COMPARISON:  Chest radiograph 05/30/2017 FINDINGS: Cardiovascular: Good opacification of the central and segmental pulmonary arteries. No focal filling defects. No evidence of significant pulmonary embolus. Normal heart size. No pericardial effusion. Coronary artery calcifications. Normal caliber thoracic aorta. No dissection. Great vessel origins are  patent. Aortic calcification. Mediastinum/Nodes: Esophagus is decompressed. No significant mediastinal lymphadenopathy. There is an enlarged right axillary lymph node measuring 14 mm short axis dimension. Etiology is nonspecific. This could represent inflammatory node although metastatic disease or lymphoma could also have this appearance. Lungs/Pleura: Atelectasis in the lung bases. No consolidation or edema. Mild emphysematous changes in the upper lungs. No pleural effusions. No pneumothorax. Airways are patent. Upper Abdomen: No acute abnormality. Musculoskeletal: No chest wall abnormality. No acute or significant osseous findings. Diffuse degenerative changes of the thoracic spine with bridging anterior osteophytes. Review of the MIP images confirms the above findings. IMPRESSION: 1. No evidence of significant pulmonary embolus. 2. Coronary artery calcifications. 3. Nonspecific 14 mm enlarged lymph node in the right axilla. Physical examination may be useful in further evaluation. 4. Atelectasis in the lung bases. No evidence of active pulmonary disease. Mild pulmonary emphysema. Aortic Atherosclerosis  (ICD10-I70.0) and Emphysema (ICD10-J43.9). Electronically Signed   By: Lucienne Capers M.D.   On: 05/31/2017 01:42   Ct Cervical Spine Wo Contrast  Result Date: 05/31/2017 CLINICAL DATA:  71 y/o  M; multiple falls tonight with head injury. EXAM: CT HEAD WITHOUT CONTRAST CT CERVICAL SPINE WITHOUT CONTRAST TECHNIQUE: Multidetector CT imaging of the head and cervical spine was performed following the standard protocol without intravenous contrast. Multiplanar CT image reconstructions of the cervical spine were also generated. COMPARISON:  None. FINDINGS: CT HEAD FINDINGS Brain: No evidence of acute infarction, hemorrhage, hydrocephalus, extra-axial collection or mass lesion/mass effect. Mild chronic microvascular ischemic changes and parenchymal volume loss of the brain. Vascular: Calcific atherosclerosis of carotid siphons. Skull: Normal. Negative for fracture or focal lesion. Sinuses/Orbits: Mild diffuse paranasal sinus mucosal thickening. Normal aeration of mastoid air cells. Orbits are unremarkable. Other: None. CT CERVICAL SPINE FINDINGS Alignment: Normal. Skull base and vertebrae: No acute fracture. No primary bone lesion or focal pathologic process. Soft tissues and spinal canal: No prevertebral fluid or swelling. No visible canal hematoma. Disc levels: Cervical spondylosis with mild multilevel discogenic degenerative changes, right C7-T1 and left upper cervical facet arthropathy. Upper chest: Mild biapical paraseptal emphysema. Other: Extensive calcific atherosclerosis of carotid siphons. IMPRESSION: 1. No acute intracranial abnormality or calvarial fracture. 2. No acute fracture or dislocation of cervical spine. 3. Mild chronic microvascular ischemic changes and parenchymal volume loss of the brain. 4. Mild paranasal sinus disease. 5. Mild cervical spondylosis.  No high-grade bony canal stenosis. Electronically Signed   By: Kristine Garbe M.D.   On: 05/31/2017 00:31   Dg Knee Complete 4 Views  Left  Result Date: 05/31/2017 CLINICAL DATA:  Status post fall, with left knee pain and deformity. Recent left knee replacement. Fever and left knee swelling and erythema. EXAM: LEFT KNEE - COMPLETE 4+ VIEW COMPARISON:  None. FINDINGS: There is no evidence of acute fracture or dislocation. The total knee arthroplasty appears grossly intact, without evidence of loosening. Prominent soft tissue air is seen tracking about the lateral aspect of the knee, along the lateral lower leg, and along the medial aspect of the left thigh. This may simply be postoperative in nature. Would correlate clinically for evidence of infection with a gas producing organism. A small knee joint effusion is noted. Scattered vascular calcifications are seen. Diffuse soft tissue swelling is noted about the knee and lower thigh. IMPRESSION: 1. No evidence of fracture or dislocation. Total knee arthroplasty appears intact. 2. Prominent soft tissue air about the lateral aspect of the knee, along the lateral lower leg, and along the medial aspect of  the left thigh. This may simply be postoperative in nature. Would correlate clinically for evidence of infection with a gas producing organism, or for signs of necrotizing fasciitis. 3. Small knee joint effusion. Diffuse soft tissue swelling about the knee and lower thigh. 4. Scattered vascular calcifications seen. These results were called by telephone at the time of interpretation on 05/31/2017 at 12:13 am to Dr. Threasa Beards BELFI, who verbally acknowledged these results. Electronically Signed   By: Garald Balding M.D.   On: 05/31/2017 00:15   Dg Knee Complete 4 Views Right  Result Date: 05/30/2017 CLINICAL DATA:  Fall today. Knee pain and swelling. Initial encounter. EXAM: RIGHT KNEE - COMPLETE 4+ VIEW COMPARISON:  None. FINDINGS: No evidence of fracture, dislocation, or joint effusion. Moderate osteoarthritis involving the medial compartment. Enthesopathic changes seen involving the patella and  anterior tibial tubercle. Peripheral vascular calcification also noted. IMPRESSION: No acute findings. Moderate medial compartment osteoarthritis. Electronically Signed   By: Earle Gell M.D.   On: 05/30/2017 21:35    Review of Systems  Constitutional: Positive for fever.  Respiratory: Negative for cough and shortness of breath.   Neurological: Positive for dizziness.  All other systems reviewed and are negative.  Blood pressure (!) 151/73, pulse (!) 107, temperature (!) 102.3 F (39.1 C), temperature source Rectal, resp. rate 16, height 5' 9"  (1.753 m), weight 81.6 kg (180 lb), SpO2 96 %. Physical Exam  Constitutional: He is oriented to person, place, and time. He appears well-developed.  HENT:  Head: Normocephalic.  Neck: Normal range of motion.  Cardiovascular:  tachy  Respiratory: Effort normal.  GI: Soft.  Musculoskeletal:  Knee wound healing well. Moderate swelling. Compartments soft. No pain with dorsiflexion/plantarflexion. Some thigh pain. Neurovascularly intact.   Neurological: He is alert and oriented to person, place, and time.  Skin: Skin is warm and dry.  Psychiatric: He has a normal mood and affect.  No cellulitis.  Assessment/Plan:  Patient with fever and tachycardia.  In sepsis protocol. Knee appears to be consistent with postop total knee and Not a necrotizing fasc. Discussed with radiology and Hospitalist. Will follow on admit. Repeat xrays and WBC to follow.  Dictation #[id number]  Siri Buege C 05/31/2017, 2:08 AM

## 2017-05-31 NOTE — Progress Notes (Signed)
Subjective: Patient admitted this morning, see detailed H&P by Dr Alcario Drought 71 y.o. male with medical history significant of recent left TKA on 12/28.  Patient presents to the ED with fever and generalized weakness.  He was doing well after the knee replacement.  Some troubles with ambulation / balance since then.  Today had increased confusion and fever per wife.  Weakness is generalized and non-focal.  No cough, no SOB, no dysuria, no abd pain, no headache, no CP, does have some tenderness and pain to L knee.    Vitals:   05/31/17 0840 05/31/17 1200  BP: 127/86 (!) 151/73  Pulse: (!) 127 100  Resp: 20 20  Temp: 99.6 F (37.6 C) (!) 100.5 F (38.1 C)  SpO2: 98% 97%      A/P  SIRS Unclear etiology, chest x-ray is negative for infiltrate, UA is clear. Blood cultures have been obtained and are currently pending. Patient empirically started on vancomycin and Zosyn. He still has warm and swollen left knee. I discussed with orthopedics Dr. Tonita Cong, will continue with IV antibiotics at this time and follow blood culture results. Patient might need to have left knee explored again for infectious process. Orthopedics to decide.  Diabetes mellitus Continue sliding scale insulin with NovoLog.  [facility] [phone]

## 2017-05-31 NOTE — Progress Notes (Signed)
Subjective:    Patient reports pain as 3 on 0-10 scale.    Objective: Vital signs in last 24 hours: Temp:  [99.6 F (37.6 C)-102.3 F (39.1 C)] 99.9 F (37.7 C) (12/31 0417) Pulse Rate:  [107-133] 115 (12/31 0417) Resp:  [16-25] 18 (12/31 0417) BP: (149-164)/(65-83) 163/65 (12/31 0417) SpO2:  [94 %-100 %] 100 % (12/31 0417) Weight:  [80.9 kg (178 lb 5.6 oz)-81.6 kg (180 lb)] 80.9 kg (178 lb 5.6 oz) (12/31 0417)  Intake/Output from previous day: 12/30 0701 - 12/31 0700 In: 695.8 [I.V.:195.8; IV Piggyback:500] Out: -  Intake/Output this shift: No intake/output data recorded.  Recent Labs    05/29/17 0543 05/30/17 0527 05/30/17 2219 05/31/17 0527  HGB 13.9 12.4* 14.2 12.7*   Recent Labs    05/30/17 2219 05/31/17 0527  WBC 10.2 10.0  RBC 4.67 4.27  HCT 41.6 38.4*  PLT 223 212   Recent Labs    05/30/17 2219 05/31/17 0527  NA 134* 136  K 4.2 4.2  CL 100* 104  CO2 21* 19*  BUN 14 12  CREATININE 1.25* 1.16  GLUCOSE 152* 110*  CALCIUM 9.4 8.8*   No results for input(s): LABPT, INR in the last 72 hours.  Neurologically intact Sensation intact distally Intact pulses distally Incision: dressing C/D/I and no drainage Moderate swelling left leg. Compartments soft. SLR just off the bed. Tender lateal thigh, knee, calf but no Homman sign Assessment/Plan:    Continue ABX therapy due to Post-op infection of unknown origin WBC not elevated. Low grade temp Doppler pending and repeat xray of knee. [place] so soft tissue pain. Knee immobilizer. Quad injury possible lovenox restart if tests negative. Elevate Neela Zecca C 05/31/2017, 8:11 AM

## 2017-05-31 NOTE — Progress Notes (Signed)
Subjective:   feels a little better. C/O left knee pain    Normal for POD # 3. Patient reports pain as 3 on 0-10 scale and 4 on 0-10 scale.    Objective: Vital signs in last 24 hours: Temp:  [99.6 F (37.6 C)-102.3 F (39.1 C)] 100.5 F (38.1 C) (12/31 1200) Pulse Rate:  [100-133] 100 (12/31 1200) Resp:  [16-25] 20 (12/31 1200) BP: (127-164)/(65-86) 151/73 (12/31 1200) SpO2:  [94 %-100 %] 97 % (12/31 1200) Weight:  [80.9 kg (178 lb 5.6 oz)-81.6 kg (180 lb)] 80.9 kg (178 lb 5.6 oz) (12/31 0417)  Intake/Output from previous day:f34f 12/30 0701 - 12/31 0700 In: 695.8 [I.V.:195.8; IV Piggyback:500] Out: -  Intake/Output this shift: Total I/O In: -  Out: 700 [Urine:700]  Recent Labs    05/29/17 0543 05/30/17 0527 05/30/17 2219 05/31/17 0527  HGB 13.9 12.4* 14.2 12.7*   Recent Labs    05/30/17 2219 05/31/17 0527  WBC 10.2 10.0  RBC 4.67 4.27  HCT 41.6 38.4*  PLT 223 212   Recent Labs    05/30/17 2219 05/31/17 0527  NA 134* 136  K 4.2 4.2  CL 100* 104  CO2 21* 19*  BUN 14 12  CREATININE 1.25* 1.16  GLUCOSE 152* 110*  CALCIUM 9.4 8.8*   No results for input(s): LABPT, INR in the last 72 hours.   Intact pulses distally Dorsiflexion/Plantar flexion intact Incision: no drainage No cellulitis present Compartment soft Wound OK appears WNL for this POD. Few swelling blisters not uncommon.\  Air on Xray normal for post op.   Assessment/Plan:   A   I do not think the knee is infected. Maybe viral syndrome  . Would cintinue current treatment. Discussed with pt and will follow with you. ASA is enough for now . Will ask PT to work with tomorrow. Not sure if he has structural damage after fall. Will follow clinically. I am out of town tomorrow. Have asked DR Alvan Dame to cover in my absence.eciate everyones help.  Lonni Dirden ANDREW 05/31/2017, 2:15 PM

## 2017-05-31 NOTE — Consult Note (Signed)
NAME:  Jeremy Nicholson, Jeremy Nicholson                   ACCOUNT NO.:  MEDICAL RECORD NO.:  [id number]  LOCATION:                                 FACILITY:  PHYSICIAN:  Susa Day, M.D.         DATE OF BIRTH:  DATE OF CONSULTATION:  05/31/2017 DATE OF DISCHARGE:                                CONSULTATION   ADDENDUM:  ASSESSMENT AND PLAN:  The patient was discharged from the hospital this morning and he had a total knee replacement by Dr. Theda Sers on Friday. He went home Sunday morning, was doing well.  According to the family, went home, began to walk around, felt a bit dizzy.  His legs gave out from underneath him, he fell backwards, and his wife called the EMS.  He was referred to the hospital.  They did an extensive workup, had a rectal fever.  He had a white count, which was 10.2.  Yesterday, it was 9.4.  Because of the fall, they did a CT scan of his head and neck, which was negative.  In addition, they x-rayed both knees, his right and his left.  There was no fracture on the right.  On the left, showed no fractures, soft tissue swelling with air either consistent with postoperative air or infection if in the right setting.  I examined the knee, appears to have a standard postoperative total knee, examination with moderate swelling.  There was no evidence of purulence or drainage. His compartments are soft, saw no evidence of cellulitis.  No evidence of DVT.  I evaluated the patient.  His urine appeared to be clear, although cultures pending.  We obtained a CT scan of his chest to rule out a PE to explain this tachycardia.  It was negative.  He did have some atelectasis though.  He is a non-insulin-dependent diabetic.  I called the Hospitalist and asked for an admission to the hospital for observation of his tachycardia and further declaration.  Repeat x-ray in the morning for comparison just to look at the intra-articular air.  If this is standard postoperative air, then it would either  be constant or less.  We will also repeat his white count and manage his diabetes accordingly.  He does have knee pain from his fall.  It is unsure whether it hyperflexed or not.  He was able to perform an active straight leg raise slightly.  He had difficulty bending the knee.  Discussed this with the family and we will follow him in the hospital.  The unusual that have a deep infection, postop day 2.  Discussed again with Hospitalist, will continue evaluating for other sources.  In the interim, put him on broad- spectrum antibiotics.     Susa Day, M.D.     Geralynn Rile  D:  05/31/2017  T:  05/31/2017  Job:  [id number]

## 2017-06-01 LAB — GLUCOSE, CAPILLARY
Glucose-Capillary: 121 mg/dL — ABNORMAL HIGH (ref 65–99)
Glucose-Capillary: 146 mg/dL — ABNORMAL HIGH (ref 65–99)
Glucose-Capillary: 150 mg/dL — ABNORMAL HIGH (ref 65–99)
Glucose-Capillary: 233 mg/dL — ABNORMAL HIGH (ref 65–99)
Glucose-Capillary: 91 mg/dL (ref 65–99)
Glucose-Capillary: 99 mg/dL (ref 65–99)

## 2017-06-01 LAB — CREATININE, SERUM
CREATININE: 1.3 mg/dL — AB (ref 0.61–1.24)
GFR calc Af Amer: >60 mL/min (ref >60)
GFR, EST NON AFRICAN AMERICAN: 54 mL/min — AB (ref >60)

## 2017-06-01 NOTE — Progress Notes (Signed)
Subjective:    Patient reports pain as 1 on 0-10 scale. Wound looks fine.No calf tenderness. He has not been coughing.No urinary tract issues  Objective: Vital signs in last 24 hours: Temp:  [97.7 F (36.5 C)-100.5 F (38.1 C)] 99 F (37.2 C) (01/01 0524) Pulse Rate:  [100-113] 113 (01/01 0524) Resp:  [20] 20 (01/01 0524) BP: (124-153)/(62-74) 124/62 (01/01 0524) SpO2:  [97 %-100 %] 97 % (01/01 0524)  Intake/Output from previous day: 12/31 0701 - 01/01 0700 In: 3400 [I.V.:3000; IV Piggyback:400] Out: 1350 [Urine:1350] Intake/Output this shift: No intake/output data recorded.  Recent Labs    05/30/17 0527 05/30/17 2219 05/31/17 0527  HGB 12.4* 14.2 12.7*   Recent Labs    05/30/17 2219 05/31/17 0527  WBC 10.2 10.0  RBC 4.67 4.27  HCT 41.6 38.4*  PLT 223 212   Recent Labs    05/30/17 2219 05/31/17 0527 06/01/17 0536  NA 134* 136  --   K 4.2 4.2  --   CL 100* 104  --   CO2 21* 19*  --   BUN 14 12  --   CREATININE 1.25* 1.16 1.30*  GLUCOSE 152* 110*  --   CALCIUM 9.4 8.8*  --    No results for input(s): LABPT, INR in the last 72 hours.  Dorsiflexion/Plantar flexion intact No cellulitis present Compartment soft  Assessment/Plan:    Up with therapy  Latanya Maudlin 06/01/2017, 9:00 AM

## 2017-06-01 NOTE — Plan of Care (Signed)
  Progressing Education: Knowledge of General Education information will improve 06/01/2017 2222 - Progressing by Talbert Forest, RN Health Behavior/Discharge Planning: Ability to manage health-related needs will improve 06/01/2017 2222 - Progressing by Talbert Forest, RN Clinical Measurements: Ability to maintain clinical measurements within normal limits will improve 06/01/2017 2222 - Progressing by Talbert Forest, RN Diagnostic test results will improve 06/01/2017 2222 - Progressing by Talbert Forest, RN Respiratory complications will improve 06/01/2017 2222 - Progressing by Talbert Forest, RN Cardiovascular complication will be avoided 06/01/2017 2222 - Progressing by Talbert Forest, RN Nutrition: Adequate nutrition will be maintained 06/01/2017 2222 - Progressing by Talbert Forest, RN Coping: Level of anxiety will decrease 06/01/2017 2222 - Progressing by Talbert Forest, RN Elimination: Will not experience complications related to urinary retention 06/01/2017 2222 - Progressing by Talbert Forest, RN Pain Managment: General experience of comfort will improve 06/01/2017 2222 - Progressing by Talbert Forest, RN

## 2017-06-01 NOTE — Evaluation (Addendum)
Physical Therapy Evaluation Patient Details Name: Jeremy Nicholson MRN: [id number] DOB: 08-11-1945 Today's Date: 06/01/2017   History of Present Illness  Pt is a 72 year old male who presented to ED with fever, fall at home, and generalized weakness, admitted for SIRS and recently s/p L TKA 05/28/17  Clinical Impression  Pt admitted with above diagnosis. Pt currently with functional limitations due to the deficits listed below (see PT Problem List).  Pt will benefit from skilled PT to increase their independence and safety with mobility to allow discharge to the venue listed below.  Pt unable to perform SLR and not able to tolerate knee flexion due to pain at this time.  Pt educated to wear KI with mobility (standing/walking) for safety and support.  Xrays without acute fractures and hardware intact.  Per RN, no new orders for any further knee imaging.  Pt assisted with ambulating short distance however reports increased pain.  Pt provided with 2 ice packs to L knee prior to leaving room.  Recommended pt call ortho MD office if increased pain persists or activity remains intolerable upon d/c.     Follow Up Recommendations DC plan and follow up therapy as arranged by surgeon;Home health PT;Supervision for mobility/OOB    Equipment Recommendations  None recommended by PT    Recommendations for Other Services       Precautions / Restrictions Precautions Precautions: Fall;Knee Required Braces or Orthoses: Knee Immobilizer - Left Knee Immobilizer - Left: On when out of bed or walking Restrictions Weight Bearing Restrictions: No Other Position/Activity Restrictions: WBAT      Mobility  Bed Mobility Overal bed mobility: Needs Assistance Bed Mobility: Supine to Sit     Supine to sit: Supervision     General bed mobility comments: pt self assist L LE with UEs  Transfers Overall transfer level: Needs assistance Equipment used: Rolling walker (2 wheeled) Transfers: Sit to/from  Stand Sit to Stand: Min assist         General transfer comment: assist to rise and steady, slight posterior lean with rise, cues for hand placement and L LE forward  Ambulation/Gait Ambulation/Gait assistance: Min guard Ambulation Distance (Feet): 60 Feet Assistive device: Rolling walker (2 wheeled) Gait Pattern/deviations: Step-to pattern;Decreased stance time - left;Antalgic Gait velocity: decr   General Gait Details: verbal cues for sequence, RW positioning, posture, step length  Stairs            Wheelchair Mobility    Modified Rankin (Stroke Patients Only)       Balance                                             Pertinent Vitals/Pain Pain Assessment: 0-10 Pain Score: 5  Pain Location: left knee Pain Descriptors / Indicators: Sore;Aching Pain Intervention(s): Limited activity within patient's tolerance;Repositioned;Monitored during session;Premedicated before session;Ice applied;Patient requesting pain meds-RN notified    Home Living Family/patient expects to be discharged to:: Private residence Living Arrangements: Spouse/significant other Available Help at Discharge: Family Type of Home: House Home Access: Stairs to enter   [employer] of Steps: 2 Home Layout: Two level;Able to live on main level with bedroom/bathroom Home Equipment: Walker - 2 wheels      Prior Function Level of Independence: Independent with assistive device(s)         Comments: independent prior to recent knee surgery  Hand Dominance        Extremity/Trunk Assessment        Lower Extremity Assessment Lower Extremity Assessment: LLE deficits/detail LLE Deficits / Details: ankle WFL; pt unable to perform SLR without assistance, pain with assisted SLR, attempted flexion and pt not able to tolerate due to pain, fair quad contraction, maintained KI for mobility LLE: Unable to fully assess due to pain       Communication    Communication: No difficulties  Cognition Arousal/Alertness: Awake/alert Behavior During Therapy: WFL for tasks assessed/performed Overall Cognitive Status: Within Functional Limits for tasks assessed                                        General Comments      Exercises     Assessment/Plan    PT Assessment Patient needs continued PT services  PT Problem List Decreased strength;Decreased range of motion;Decreased knowledge of use of DME;Pain;Decreased knowledge of precautions;Decreased mobility       PT Treatment Interventions DME instruction;Therapeutic activities;Gait training;Therapeutic exercise;Functional mobility training;Patient/family education;Stair training    PT Goals (Current goals can be found in the Care Plan section)  Acute Rehab PT Goals PT Goal Formulation: With patient Time For Goal Achievement: 06/15/17    Frequency Min 4X/week   Barriers to discharge        Co-evaluation               AM-PAC PT "6 Clicks" Daily Activity  Outcome Measure Difficulty turning over in bed (including adjusting bedclothes, sheets and blankets)?: A Little Difficulty moving from lying on back to sitting on the side of the bed? : A Lot Difficulty sitting down on and standing up from a chair with arms (e.g., wheelchair, bedside commode, etc,.)?: Unable Help needed moving to and from a bed to chair (including a wheelchair)?: A Little Help needed walking in hospital room?: A Little Help needed climbing 3-5 steps with a railing? : A Lot 6 Click Score: 14    End of Session Equipment Utilized During Treatment: Gait belt;Left knee immobilizer Activity Tolerance: Patient tolerated treatment well Patient left: with call bell/phone within reach;in chair;with chair alarm set Nurse Communication: Mobility status PT Visit Diagnosis: Muscle weakness (generalized) (M62.81);Difficulty in walking, not elsewhere classified (R26.2)    Time: 2800-3491 PT Time  Calculation (min) (ACUTE ONLY): 19 min   Charges:   PT Evaluation $PT Eval Low Complexity: 1 Low     PT G Codes:       Carmelia Bake, PT, DPT 06/01/2017 Pager: [phone]  York Ram E 06/01/2017, 12:54 PM

## 2017-06-01 NOTE — Progress Notes (Addendum)
[facility]  PROGRESS NOTE  Hien Perreira IDP:[id number] DOB: September 28, 1945 DOA: 05/30/2017 PCP: [name], MD   Brief HPI:   72 y.o.malewith medical history significant ofrecent left TKA on 12/28. Patient presents to the ED with fever and generalized weakness. He was doing well after the knee replacement. Some troubles with ambulation / balance since then. Today had increased confusion and fever per wife. Weakness is generalized and non-focal. No cough, no SOB, no dysuria, no abd pain, no headache, no CP, does have some tenderness and pain to L knee.    Subjective   Patient seen and examined, denies knee pain. No coughing. No shortness of breath. He did have a temperature of 100.4 last night.   Assessment/Plan:     1. SIRS-unclear etiology. Orthopedics do not feel that patient has postop infection of left knee. You is clear, chest x-ray showed no infiltrate. Blood cultures time to are negative to date. Patient started empirically on vancomycin and Zosyn. Swelling of the left knee has improved. Will follow blood culture results. Orthopedics following. Patient might be able to go home if cleared by orthopedics. 2. Heart murmur-patient has grade 3/6 systolic murmur at the mitral area will obtain echocardiogram.,  3. Diabetes mellitus- continue sliding scale insulin with NovoLog.     DVT prophylaxis: Lovenox  Code Status: full code  Family Communication: no family at bedside  Disposition Plan: likely next 24 to 48 hours if continues to remain afebrile, follow orthopedics recommendation   Consultants:  orthopedics  Procedures:  none  Continuous infusions . sodium chloride 125 mL/hr at 06/01/17 0635  . piperacillin-tazobactam (ZOSYN)  IV 3.375 g (06/01/17 1415)  . vancomycin Stopped (05/31/17 2230)      Antibiotics:   Anti-infectives (From admission, onward)   Start     Dose/Rate Route Frequency Ordered Stop   05/31/17 2200  vancomycin (VANCOCIN)  1,250 mg in sodium chloride 0.9 % 250 mL IVPB     1,250 mg 166.7 mL/hr over 90 Minutes Intravenous Every 24 hours 05/31/17 0311     05/31/17 0600  piperacillin-tazobactam (ZOSYN) IVPB 3.375 g     3.375 g 12.5 mL/hr over 240 Minutes Intravenous Every 8 hours 05/31/17 0311     05/30/17 2300  vancomycin (VANCOCIN) 1,750 mg in sodium chloride 0.9 % 500 mL IVPB     1,750 mg 250 mL/hr over 120 Minutes Intravenous  Once 05/30/17 2248 05/31/17 0121   05/30/17 2245  piperacillin-tazobactam (ZOSYN) IVPB 3.375 g     3.375 g 100 mL/hr over 30 Minutes Intravenous  Once 05/30/17 2239 05/30/17 2320   05/30/17 2245  vancomycin (VANCOCIN) IVPB 1000 mg/200 mL premix  Status:  Discontinued     1,000 mg 200 mL/hr over 60 Minutes Intravenous  Once 05/30/17 2239 05/30/17 2248       Objective   Vitals:   05/31/17 1611 05/31/17 2036 06/01/17 0524 06/01/17 1424  BP: (!) 153/74 (!) 146/73 124/62 (!) 166/68  Pulse: (!) 110 (!) 113 (!) 113 97  Resp: 20 20 20 19   Temp: 98.2 F (36.8 C) 97.7 F (36.5 C) 99 F (37.2 C) 98.6 F (37 C)  TempSrc: Oral Axillary Oral Oral  SpO2: 100% 97% 97% 99%  Weight:      Height:        Intake/Output Summary (Last 24 hours) at 06/01/2017 1531 Last data filed at 06/01/2017 1425 Gross per 24 hour  Intake 3880 ml  Output 1250 ml  Net 2630 ml   [employer]  05/30/17 2104 05/31/17 0417  Weight: 81.6 kg (180 lb) 80.9 kg (178 lb 5.6 oz)     Physical Examination:   Physical Exam: Eyes: No icterus, extraocular muscles intact  Mouth: Oral mucosa is moist, no lesions on palate,  Neck: Supple, no deformities, masses, or tenderness Lungs: Normal respiratory effort, bilateral clear to auscultation, no crackles or wheezes.  Heart: Regular rate and rhythm, S1 and S2 normal,  Grade 3/6 systolic murmur at apex Abdomen: BS normoactive,soft,nondistended,non-tender to palpation,no organomegaly Extremities: left knee is edematous, mild warmth to touch Neuro : Alert and  oriented to time, place and person, No focal deficits  Skin: No rashes seen on exam     Data Reviewed: I have personally reviewed following labs and imaging studies  CBG: Recent Labs  Lab 05/31/17 2035 05/31/17 2317 06/01/17 0521 06/01/17 0735 06/01/17 1143  GLUCAP 122* 115* 99 91 146*    CBC: Recent Labs  Lab 05/29/17 0543 05/30/17 0527 05/30/17 2219 05/31/17 0527  WBC 9.4 7.9 10.2 10.0  NEUTROABS  --   --  8.2* 6.9  HGB 13.9 12.4* 14.2 12.7*  HCT 42.6 38.6* 41.6 38.4*  MCV 89.9 90.4 89.1 89.9  PLT 244 206 223 366    Basic Metabolic Panel: Recent Labs  Lab 05/29/17 0543 05/30/17 2219 05/31/17 0527 06/01/17 0536  NA 136 134* 136  --   K 4.3 4.2 4.2  --   CL 104 100* 104  --   CO2 23 21* 19*  --   GLUCOSE 125* 152* 110*  --   BUN 16 14 12   --   CREATININE 1.26* 1.25* 1.16 1.30*  CALCIUM 8.5* 9.4 8.8*  --     Recent Results (from the past 240 hour(s))  Blood Culture (routine x 2)     Status: None (Preliminary result)   Collection Time: 05/30/17 10:36 PM  Result Value Ref Range Status   Specimen Description BLOOD LEFT ANTECUBITAL  Final   Special Requests   Final    BOTTLES DRAWN AEROBIC AND ANAEROBIC Blood Culture adequate volume   Culture   Final    NO GROWTH 1 DAY Performed at [facility], [address], [place], [place] [address]    Report Status PENDING  Incomplete  Blood Culture (routine x 2)     Status: None (Preliminary result)   Collection Time: 05/30/17 10:38 PM  Result Value Ref Range Status   Specimen Description BLOOD LEFT FOREARM  Final   Special Requests IN PEDIATRIC BOTTLE Blood Culture adequate volume  Final   Culture   Final    NO GROWTH 1 DAY Performed at [facility], [place] [address], [place], [place] [address]    Report Status PENDING  Incomplete     Liver Function Tests: Recent Labs  Lab 05/30/17 2219  AST 31  ALT 17  ALKPHOS 76  BILITOT 1.6*  PROT 8.8*  ALBUMIN 4.0      Studies: Dg Chest 2  View  Result Date: 05/31/2017 CLINICAL DATA:  Status post fall, with concern for chest injury. Initial encounter. EXAM: CHEST  2 VIEW COMPARISON:  None. FINDINGS: The lungs are well-aerated. There is mild elevation of the left hemidiaphragm. There is no evidence of focal opacification, pleural effusion or pneumothorax. The heart is normal in size; the mediastinal contour is within normal limits. No acute osseous abnormalities are seen. IMPRESSION: Mild elevation of the left hemidiaphragm. Lungs remain grossly clear. Electronically Signed   By: Garald Balding  M.D.   On: 05/31/2017 00:03   Ct Head Wo Contrast  Result Date: 05/31/2017 CLINICAL DATA:  72 y/o  M; multiple falls tonight with head injury. EXAM: CT HEAD WITHOUT CONTRAST CT CERVICAL SPINE WITHOUT CONTRAST TECHNIQUE: Multidetector CT imaging of the head and cervical spine was performed following the standard protocol without intravenous contrast. Multiplanar CT image reconstructions of the cervical spine were also generated. COMPARISON:  None. FINDINGS: CT HEAD FINDINGS Brain: No evidence of acute infarction, hemorrhage, hydrocephalus, extra-axial collection or mass lesion/mass effect. Mild chronic microvascular ischemic changes and parenchymal volume loss of the brain. Vascular: Calcific atherosclerosis of carotid siphons. Skull: Normal. Negative for fracture or focal lesion. Sinuses/Orbits: Mild diffuse paranasal sinus mucosal thickening. Normal aeration of mastoid air cells. Orbits are unremarkable. Other: None. CT CERVICAL SPINE FINDINGS Alignment: Normal. Skull base and vertebrae: No acute fracture. No primary bone lesion or focal pathologic process. Soft tissues and spinal canal: No prevertebral fluid or swelling. No visible canal hematoma. Disc levels: Cervical spondylosis with mild multilevel discogenic degenerative changes, right C7-T1 and left upper cervical facet arthropathy. Upper chest: Mild biapical paraseptal emphysema. Other: Extensive  calcific atherosclerosis of carotid siphons. IMPRESSION: 1. No acute intracranial abnormality or calvarial fracture. 2. No acute fracture or dislocation of cervical spine. 3. Mild chronic microvascular ischemic changes and parenchymal volume loss of the brain. 4. Mild paranasal sinus disease. 5. Mild cervical spondylosis.  No high-grade bony canal stenosis. Electronically Signed   By: Kristine Garbe M.D.   On: 05/31/2017 00:31   Ct Angio Chest Pe W And/or Wo Contrast  Result Date: 05/31/2017 CLINICAL DATA:  Fever, weakness, tachycardia, elevated white cell count after total knee replacement. History of hypertension and diabetes. EXAM: CT ANGIOGRAPHY CHEST WITH CONTRAST TECHNIQUE: Multidetector CT imaging of the chest was performed using the standard protocol during bolus administration of intravenous contrast. Multiplanar CT image reconstructions and MIPs were obtained to evaluate the vascular anatomy. CONTRAST:  100 mL Isovue 370 COMPARISON:  Chest radiograph 05/30/2017 FINDINGS: Cardiovascular: Good opacification of the central and segmental pulmonary arteries. No focal filling defects. No evidence of significant pulmonary embolus. Normal heart size. No pericardial effusion. Coronary artery calcifications. Normal caliber thoracic aorta. No dissection. Great vessel origins are patent. Aortic calcification. Mediastinum/Nodes: Esophagus is decompressed. No significant mediastinal lymphadenopathy. There is an enlarged right axillary lymph node measuring 14 mm short axis dimension. Etiology is nonspecific. This could represent inflammatory node although metastatic disease or lymphoma could also have this appearance. Lungs/Pleura: Atelectasis in the lung bases. No consolidation or edema. Mild emphysematous changes in the upper lungs. No pleural effusions. No pneumothorax. Airways are patent. Upper Abdomen: No acute abnormality. Musculoskeletal: No chest wall abnormality. No acute or significant osseous  findings. Diffuse degenerative changes of the thoracic spine with bridging anterior osteophytes. Review of the MIP images confirms the above findings. IMPRESSION: 1. No evidence of significant pulmonary embolus. 2. Coronary artery calcifications. 3. Nonspecific 14 mm enlarged lymph node in the right axilla. Physical examination may be useful in further evaluation. 4. Atelectasis in the lung bases. No evidence of active pulmonary disease. Mild pulmonary emphysema. Aortic Atherosclerosis (ICD10-I70.0) and Emphysema (ICD10-J43.9). Electronically Signed   By: Lucienne Capers M.D.   On: 05/31/2017 01:42   Ct Cervical Spine Wo Contrast  Result Date: 05/31/2017 CLINICAL DATA:  72 y/o  M; multiple falls tonight with head injury. EXAM: CT HEAD WITHOUT CONTRAST CT CERVICAL SPINE WITHOUT CONTRAST TECHNIQUE: Multidetector CT imaging of the head and cervical spine was performed  following the standard protocol without intravenous contrast. Multiplanar CT image reconstructions of the cervical spine were also generated. COMPARISON:  None. FINDINGS: CT HEAD FINDINGS Brain: No evidence of acute infarction, hemorrhage, hydrocephalus, extra-axial collection or mass lesion/mass effect. Mild chronic microvascular ischemic changes and parenchymal volume loss of the brain. Vascular: Calcific atherosclerosis of carotid siphons. Skull: Normal. Negative for fracture or focal lesion. Sinuses/Orbits: Mild diffuse paranasal sinus mucosal thickening. Normal aeration of mastoid air cells. Orbits are unremarkable. Other: None. CT CERVICAL SPINE FINDINGS Alignment: Normal. Skull base and vertebrae: No acute fracture. No primary bone lesion or focal pathologic process. Soft tissues and spinal canal: No prevertebral fluid or swelling. No visible canal hematoma. Disc levels: Cervical spondylosis with mild multilevel discogenic degenerative changes, right C7-T1 and left upper cervical facet arthropathy. Upper chest: Mild biapical paraseptal  emphysema. Other: Extensive calcific atherosclerosis of carotid siphons. IMPRESSION: 1. No acute intracranial abnormality or calvarial fracture. 2. No acute fracture or dislocation of cervical spine. 3. Mild chronic microvascular ischemic changes and parenchymal volume loss of the brain. 4. Mild paranasal sinus disease. 5. Mild cervical spondylosis.  No high-grade bony canal stenosis. Electronically Signed   By: Kristine Garbe M.D.   On: 05/31/2017 00:31   Dg Knee Complete 4 Views Left  Result Date: 05/31/2017 CLINICAL DATA:  Status post fall, with left knee pain and deformity. Recent left knee replacement. Fever and left knee swelling and erythema. EXAM: LEFT KNEE - COMPLETE 4+ VIEW COMPARISON:  None. FINDINGS: There is no evidence of acute fracture or dislocation. The total knee arthroplasty appears grossly intact, without evidence of loosening. Prominent soft tissue air is seen tracking about the lateral aspect of the knee, along the lateral lower leg, and along the medial aspect of the left thigh. This may simply be postoperative in nature. Would correlate clinically for evidence of infection with a gas producing organism. A small knee joint effusion is noted. Scattered vascular calcifications are seen. Diffuse soft tissue swelling is noted about the knee and lower thigh. IMPRESSION: 1. No evidence of fracture or dislocation. Total knee arthroplasty appears intact. 2. Prominent soft tissue air about the lateral aspect of the knee, along the lateral lower leg, and along the medial aspect of the left thigh. This may simply be postoperative in nature. Would correlate clinically for evidence of infection with a gas producing organism, or for signs of necrotizing fasciitis. 3. Small knee joint effusion. Diffuse soft tissue swelling about the knee and lower thigh. 4. Scattered vascular calcifications seen. These results were called by telephone at the time of interpretation on 05/31/2017 at 12:13 am to  Dr. Threasa Beards BELFI, who verbally acknowledged these results. Electronically Signed   By: Garald Balding M.D.   On: 05/31/2017 00:15   Dg Knee Complete 4 Views Right  Result Date: 05/30/2017 CLINICAL DATA:  Fall today. Knee pain and swelling. Initial encounter. EXAM: RIGHT KNEE - COMPLETE 4+ VIEW COMPARISON:  None. FINDINGS: No evidence of fracture, dislocation, or joint effusion. Moderate osteoarthritis involving the medial compartment. Enthesopathic changes seen involving the patella and anterior tibial tubercle. Peripheral vascular calcification also noted. IMPRESSION: No acute findings. Moderate medial compartment osteoarthritis. Electronically Signed   By: Earle Gell M.D.   On: 05/30/2017 21:35   Dg Knee Left Port  Result Date: 05/31/2017 CLINICAL DATA:  History of knee surgery on 05/28/2017. Evaluate air seen on knee radiographs performed 05/03/2017 EXAM: PORTABLE LEFT KNEE - 1-2 VIEW COMPARISON:  05/30/2017 FINDINGS: Post left total knee replacement. Alignment  appears anatomic. No evidence of hardware failure loosening. No fracture or dislocation. Minimal enthesopathic change involving the superior pole of the patella. Re- demonstrated trace amount of intra-articular air and subcutaneous emphysema about the lateral aspect of the operative site, presumably postoperative in etiology. Adjacent vascular calcifications.  No radiopaque foreign body. IMPRESSION: 1. Post left total knee replacement with minimal amount of intra-articular air and subcutaneous emphysema, similar to the 05/30/2017 examination and presumably postoperative in etiology. 2. No evidence of hardware failure or loosening. Electronically Signed   By: Sandi Mariscal M.D.   On: 05/31/2017 09:23    Scheduled Meds: . aspirin EC  325 mg Oral BID  . enoxaparin (LOVENOX) injection  40 mg Subcutaneous Q24H  . insulin aspart  0-9 Units Subcutaneous Q4H  . metoprolol tartrate  50 mg Oral Daily  . niacin  500 mg Oral Daily  . tamsulosin  0.4  mg Oral QHS      Time spent: 20 min  [facility] Pager [phone]. If 7PM-7AM, please contact night-coverage at [email or web address], Office  [phone]  password [email or web address]  06/01/2017, 3:31 PM  LOS: 1 day

## 2017-06-02 ENCOUNTER — Inpatient Hospital Stay (HOSPITAL_COMMUNITY): Payer: PPO

## 2017-06-02 DIAGNOSIS — I361 Nonrheumatic tricuspid (valve) insufficiency: Secondary | ICD-10-CM

## 2017-06-02 HISTORY — PX: TRANSTHORACIC ECHOCARDIOGRAM: SHX275

## 2017-06-02 LAB — URINALYSIS, ROUTINE W REFLEX MICROSCOPIC
Bacteria, UA: NONE SEEN
Bilirubin Urine: NEGATIVE
KETONES UR: 80 mg/dL — AB
LEUKOCYTES UA: NEGATIVE
NITRITE: NEGATIVE
PH: 5 (ref 5.0–8.0)
PROTEIN: 100 mg/dL — AB
Specific Gravity, Urine: 1.026 (ref 1.005–1.030)

## 2017-06-02 LAB — GLUCOSE, CAPILLARY
GLUCOSE-CAPILLARY: 119 mg/dL — AB (ref 65–99)
Glucose-Capillary: 118 mg/dL — ABNORMAL HIGH (ref 65–99)
Glucose-Capillary: 187 mg/dL — ABNORMAL HIGH (ref 65–99)
Glucose-Capillary: 133 mg/dL — ABNORMAL HIGH (ref 65–99)
Glucose-Capillary: 155 mg/dL — ABNORMAL HIGH (ref 65–99)

## 2017-06-02 LAB — ECHOCARDIOGRAM COMPLETE
Height: 69 in
Weight: 2853.63 oz

## 2017-06-02 LAB — CREATININE, SERUM
Creatinine, Ser: 1.15 mg/dL (ref 0.61–1.24)
GFR calc non Af Amer: >60 mL/min (ref >60)

## 2017-06-02 MED ORDER — PERFLUTREN LIPID MICROSPHERE
1.0000 mL | INTRAVENOUS | Status: AC | PRN
  Administered 2017-06-02: 2 mL via INTRAVENOUS
  Filled 2017-06-02: qty 10

## 2017-06-02 MED ORDER — POLYETHYLENE GLYCOL 3350 17 G PO PACK
17.0000 g | PACK | Freq: Every day | ORAL | Status: DC
  Administered 2017-06-02 – 2017-06-03 (×2): 17 g via ORAL
  Filled 2017-06-02 (×2): qty 1

## 2017-06-02 MED ORDER — PERFLUTREN LIPID MICROSPHERE
INTRAVENOUS | Status: AC
  Filled 2017-06-02: qty 10

## 2017-06-02 MED ORDER — DOXYCYCLINE HYCLATE 100 MG PO CAPS: 100.0000 mg | ORAL_CAPSULE | Freq: Two times a day (BID) | ORAL | 0 refills | Status: DC

## 2017-06-02 MED ORDER — SENNOSIDES-DOCUSATE SODIUM 8.6-50 MG PO TABS
1.0000 | ORAL_TABLET | Freq: Two times a day (BID) | ORAL | Status: DC
  Administered 2017-06-02 – 2017-06-03 (×3): 1 via ORAL
  Filled 2017-06-02 (×3): qty 1

## 2017-06-02 NOTE — Progress Notes (Signed)
[facility]  PROGRESS NOTE  Jeremy Nicholson QIO:[id number] DOB: [birth year] DOA: 05/30/2017   PCP: [name], MD  Brief HPI:   72 y.o.malewith medical history significant ofrecent left TKA on 12/28. Patient presents to the ED with fever and generalized weakness. He was doing well after the knee replacement. Some troubles with ambulation / balance since then. At home pt had increased confusion and fever per wife, last BM one week ago.   Subjective   Pt reports feeling better this AM, has still not had BM.    Assessment/Plan:   1. SIRS-unclear etiology. Orthopedics do not feel that patient has postop infection of left knee. UA is clear and Chest x-ray showed no infiltrate. Blood cultures time to are negative to date. Patient started empirically on vancomycin and Zosyn. Swelling of the left knee has improved. Will need to follow culture report. Repeat UA still requested as pt still with intermittent dysuria. Also constipation can contribute to fevers so will make sure that we place on bowel regimen.  2. Heart murmur-patient has grade 3/6 systolic murmur at the mitral area. Pt says this was present since his teenage years. ECHO was already done, will need to follow up on results.  3. Diabetes mellitus- continue sliding scale insulin with NovoLog. 4. Constipation - placed on Miralax and Senokot  5. Acute kidney injury - suspect pre renal in etiology, resolved    DVT prophylaxis: Lovenox Code Status: full code Family Communication: wife updated at bedside  Disposition Plan: in 24-48 hours if no fevers and has BM  Consultants:  Orthopedics   Procedures:  None   Antibiotics:   Anti-infectives (From admission, onward)   Start     Dose/Rate Route Frequency Ordered Stop   06/02/17 0000  doxycycline (VIBRAMYCIN) 100 MG capsule     100 mg Oral 2 times daily 06/02/17 0743     05/31/17 2200  vancomycin (VANCOCIN) 1,250 mg in sodium chloride 0.9 % 250 mL IVPB     1,250  mg 166.7 mL/hr over 90 Minutes Intravenous Every 24 hours 05/31/17 0311     05/31/17 0600  piperacillin-tazobactam (ZOSYN) IVPB 3.375 g     3.375 g 12.5 mL/hr over 240 Minutes Intravenous Every 8 hours 05/31/17 0311     05/30/17 2300  vancomycin (VANCOCIN) 1,750 mg in sodium chloride 0.9 % 500 mL IVPB     1,750 mg 250 mL/hr over 120 Minutes Intravenous  Once 05/30/17 2248 05/31/17 0121   05/30/17 2245  piperacillin-tazobactam (ZOSYN) IVPB 3.375 g     3.375 g 100 mL/hr over 30 Minutes Intravenous  Once 05/30/17 2239 05/30/17 2320   05/30/17 2245  vancomycin (VANCOCIN) IVPB 1000 mg/200 mL premix  Status:  Discontinued     1,000 mg 200 mL/hr over 60 Minutes Intravenous  Once 05/30/17 2239 05/30/17 2248      Objective   Vitals:   06/01/17 1424 06/01/17 1956 06/02/17 0417 06/02/17 1524  BP: (!) 166/68 (!) 159/71 132/62 (!) 141/71  Pulse: 97 (!) 116 99 95  Resp: 19 20 16 19   Temp: 98.6 F (37 C) 99 F (37.2 C) 99.1 F (37.3 C) 98.3 F (36.8 C)  TempSrc: Oral Oral Oral Oral  SpO2: 99% 99% 99% 100%  Weight:      Height:        Intake/Output Summary (Last 24 hours) at 06/02/2017 2006 Last data filed at 06/02/2017 1900 Gross per 24 hour  Intake 4190.83 ml  Output 2150 ml  Net 2040.83 ml  Filed Weights   05/30/17 2104 05/31/17 0417  Weight: 81.6 kg (180 lb) 80.9 kg (178 lb 5.6 oz)    Physical Exam  Constitutional: Appears well-developed and well-nourished. No distress.  CVS: RRR, S1/S2 +, SEM 3/6, no gallops, no carotid bruit.  Pulmonary: Effort and breath sounds normal, no stridor, rhonchi, wheezes, rales.  Abdominal: Soft. BS +,  no distension, tenderness, rebound or guarding.  Musculoskeletal: Normal range of motion. Left knee is edematous and slightly warm to touch  Lymphadenopathy: No lymphadenopathy noted, cervical, inguinal. Neuro: Alert. Normal reflexes, muscle tone coordination. No cranial nerve deficit. Psychiatric: Normal mood and affect. Behavior, judgment,  thought content normal.   Data Reviewed: I have personally reviewed following blood work   CBG: Recent Labs  Lab 06/02/17 0004 06/02/17 0417 06/02/17 0720 06/02/17 1152 06/02/17 1643  GLUCAP 121* 118* 119* 187* 133*    CBC: Recent Labs  Lab 05/29/17 0543 05/30/17 0527 05/30/17 2219 05/31/17 0527  WBC 9.4 7.9 10.2 10.0  NEUTROABS  --   --  8.2* 6.9  HGB 13.9 12.4* 14.2 12.7*  HCT 42.6 38.6* 41.6 38.4*  MCV 89.9 90.4 89.1 89.9  PLT 244 206 223 657    Basic Metabolic Panel: Recent Labs  Lab 05/29/17 0543 05/30/17 2219 05/31/17 0527 06/01/17 0536 06/02/17 0517  NA 136 134* 136  --   --   K 4.3 4.2 4.2  --   --   CL 104 100* 104  --   --   CO2 23 21* 19*  --   --   GLUCOSE 125* 152* 110*  --   --   BUN 16 14 12   --   --   CREATININE 1.26* 1.25* 1.16 1.30* 1.15  CALCIUM 8.5* 9.4 8.8*  --   --     Recent Results (from the past 240 hour(s))  Blood Culture (routine x 2)     Status: None (Preliminary result)   Collection Time: 05/30/17 10:36 PM  Result Value Ref Range Status   Specimen Description BLOOD LEFT ANTECUBITAL  Final   Special Requests   Final    BOTTLES DRAWN AEROBIC AND ANAEROBIC Blood Culture adequate volume   Culture   Final    NO GROWTH 2 DAYS Performed at [facility], [address] [place], [place] [address]    Report Status PENDING  Incomplete  Blood Culture (routine x 2)     Status: None (Preliminary result)   Collection Time: 05/30/17 10:38 PM  Result Value Ref Range Status   Specimen Description BLOOD LEFT FOREARM  Final   Special Requests IN PEDIATRIC BOTTLE Blood Culture adequate volume  Final   Culture   Final    NO GROWTH 2 DAYS Performed at [facility], [place] [address], [place], [place] [address]    Report Status PENDING  Incomplete     Liver Function Tests: Recent Labs  Lab 05/30/17 2219  AST 31  ALT 17  ALKPHOS 76  BILITOT 1.6*  PROT 8.8*  ALBUMIN 4.0    Studies: Dg Abd 2 Views Result Date:  06/02/2017 No acute abdominal findings. Colonic stool burden does not appear significantly increased. 2. 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   Scheduled Meds: . aspirin EC  325 mg Oral BID  . enoxaparin (LOVENOX) injection  40 mg Subcutaneous Q24H  . insulin aspart  0-9 Units Subcutaneous Q4H  . metoprolol tartrate  50 mg Oral Daily  . niacin  500 mg Oral  Daily  . polyethylene glycol  17 g Oral Daily  . senna-docusate  1 tablet Oral BID  . tamsulosin  0.4 mg Oral QHS    Time spent: 35 min  [facility] Pager [phone]. If 7PM-7AM, please contact night-coverage at [email or web address], Office  [phone]  password [email or web address]  06/02/2017, 8:06 PM  LOS: 2 days

## 2017-06-02 NOTE — Progress Notes (Signed)
Subjective:    Patient reports pain as minimal to left knee.  Tolerating PO's. No instability with ambulating. Progressing with PT. Denies F/c. NO CP,SOb,OR calf pain reported.  Objective: Vital signs in last 24 hours: Temp:  [98.6 F (37 C)-99.1 F (37.3 C)] 99.1 F (37.3 C) (01/02 0417) Pulse Rate:  [97-116] 99 (01/02 0417) Resp:  [16-20] 16 (01/02 0417) BP: (132-166)/(62-71) 132/62 (01/02 0417) SpO2:  [99 %] 99 % (01/02 0417)  Intake/Output from previous day: 01/01 0701 - 01/02 0700 In: 4170 [P.O.:720; I.V.:3000; IV Piggyback:450] Out: 3559 [Urine:1650] Intake/Output this shift: No intake/output data recorded.  Recent Labs    05/30/17 2219 05/31/17 0527  HGB 14.2 12.7*   Recent Labs    05/30/17 2219 05/31/17 0527  WBC 10.2 10.0  RBC 4.67 4.27  HCT 41.6 38.4*  PLT 223 212   Recent Labs    05/30/17 2219 05/31/17 0527 06/01/17 0536 06/02/17 0517  NA 134* 136  --   --   K 4.2 4.2  --   --   CL 100* 104  --   --   CO2 21* 19*  --   --   BUN 14 12  --   --   CREATININE 1.25* 1.16 1.30* 1.15  GLUCOSE 152* 110*  --   --   CALCIUM 9.4 8.8*  --   --    No results for input(s): LABPT, INR in the last 72 hours.  Alert and oriented x3. RRR, Lungs clear, BS x4. Left Calf soft and non tender. L knee dressing C/D/I. No DVT signs. No signs of infection or compartment syndrome. LLE grossly neurovascularly intact.   Assessment/Plan:   SIRS:  On Medicine Service  S/P Left TKa: Up with PT Ok to d/c home from [place] to doxycycline at d/c(Rx done) Pt discussed with Dr.Olin  Pt is agreement with the plan F/u in 1 week with Dr.collins  Question encouraged and answered in detail  We appreciate the assistance from the medical team. Lajean Manes 06/02/2017, 7:37 AM

## 2017-06-02 NOTE — Care Management Note (Signed)
Case Management Note  Patient Details  Name: Seydou Hearns MRN: [id number] Date of Birth: [birth year]  Subjective/Objective: Spoke to patient/spouse in rm about insurance/[facility] concerns. [facility] [place] [facility]-Christy-financial service manager will submit HHPT request to the [facility](this does not confirm payment)-explained this to patient/spouse. Informed them that the hospital is billing [employer] for this episode of hospitalization. [facility] rep Tim aware of HHPT order-will be able to provide service to either [employer] or [facility]. Explained to patient/spouse that the co pay may be different. Patient/spouse voiced understanding.                   Action/Plan:d/c home w/HHPT   Expected Discharge Date:                  Expected Discharge Plan:  [place]  In-House Referral:     Discharge planning Services     Post Acute Care Choice:  Durable Medical Equipment(rw,3n1) Choice offered to:  Patient  DME Arranged:    DME Agency:     HH Arranged:  PT [place]:  [facility] (formerly [employer])  Status of Service:  In process, will continue to follow  If discussed at Long Length of Stay Meetings, dates discussed:    Additional Comments:  Dessa Phi, RN 06/02/2017, 10:38 AM

## 2017-06-02 NOTE — Progress Notes (Signed)
Physical Therapy Treatment Patient Details Name: Jeremy Nicholson MRN: [id number] DOB: 07-12-1945 Today's Date: 06/02/2017    History of Present Illness Pt is a 72 year old male who presented to ED with fever, fall at home, and generalized weakness, admitted for SIRS and recently s/p L TKA 05/28/17    PT Comments    Pt able to tolerate improved distance today and reports pain now 3/10 during mobility.  Pt also performed knee flexion and extension.  Pt and family educated on [place] and assist for transfers.  Pt verbally reviewed one step from garage into home.  Pt feels ready for d/c home today.    Follow Up Recommendations  DC plan and follow up therapy as arranged by surgeon;Home health PT;Supervision for mobility/OOB     Equipment Recommendations  None recommended by PT    Recommendations for Other Services       Precautions / Restrictions Precautions Precautions: Fall;Knee Precaution Comments: reviewed applying KI and wearing with standing and mobility (able to remove at rest, in chair/bed) Required Braces or Orthoses: Knee Immobilizer - Left Knee Immobilizer - Left: On when out of bed or walking Restrictions Other Position/Activity Restrictions: WBAT    Mobility  Bed Mobility               General bed mobility comments: pt up in recliner on arrival  Transfers Overall transfer level: Needs assistance Equipment used: Rolling walker (2 wheeled) Transfers: Sit to/from Stand Sit to Stand: Min assist         General transfer comment: assist to rise and steady, slight posterior lean with rise, cues for hand placement and L LE forward; family in room and aware they may need to give him a boost and to let him find his balance before walking away from chair/bed  Ambulation/Gait Ambulation/Gait assistance: Min guard Ambulation Distance (Feet): 120 Feet Assistive device: Rolling walker (2 wheeled) Gait Pattern/deviations: Step-to pattern;Step-through pattern;Decreased  stance time - left;Antalgic Gait velocity: decr   General Gait Details: verbal cues for sequence, RW positioning, posture, step length; tolerated improved distance well   Stairs            Wheelchair Mobility    Modified Rankin (Stroke Patients Only)       Balance                                            Cognition Arousal/Alertness: Awake/alert Behavior During Therapy: WFL for tasks assessed/performed Overall Cognitive Status: Within Functional Limits for tasks assessed                                        Exercises Total Joint Exercises Quad Sets: AROM;Both;10 reps Heel Slides: AAROM;Left;10 reps Goniometric ROM: approx 30* AAROM L knee flexion limited by pain    General Comments        Pertinent Vitals/Pain Pain Assessment: 0-10 Pain Score: 3  Pain Location: left knee Pain Descriptors / Indicators: Sore;Aching Pain Intervention(s): Monitored during session;Limited activity within patient's tolerance;Repositioned;Premedicated before session    Home Living                      Prior Function            PT Goals (current goals can now be found  in the care plan section) Progress towards PT goals: Progressing toward goals    Frequency    Min 4X/week      PT Plan Current plan remains appropriate    Co-evaluation              AM-PAC PT "6 Clicks" Daily Activity  Outcome Measure  Difficulty turning over in bed (including adjusting bedclothes, sheets and blankets)?: A Little Difficulty moving from lying on back to sitting on the side of the bed? : A Lot Difficulty sitting down on and standing up from a chair with arms (e.g., wheelchair, bedside commode, etc,.)?: Unable Help needed moving to and from a bed to chair (including a wheelchair)?: A Little Help needed walking in hospital room?: A Little Help needed climbing 3-5 steps with a railing? : A Little 6 Click Score: 15    End of Session  Equipment Utilized During Treatment: Left knee immobilizer Activity Tolerance: Patient tolerated treatment well Patient left: with call bell/phone within reach;in chair;with family/visitor present Nurse Communication: Mobility status PT Visit Diagnosis: Muscle weakness (generalized) (M62.81);Difficulty in walking, not elsewhere classified (R26.2)     Time: 1040-1104 PT Time Calculation (min) (ACUTE ONLY): 24 min  Charges:  $Gait Training: 8-22 mins                    G Codes:       Carmelia Bake, PT, DPT 06/02/2017 Pager: [phone]  York Ram E 06/02/2017, 1:18 PM

## 2017-06-02 NOTE — Progress Notes (Signed)
  Echocardiogram 2D Echocardiogram has been performed.  Merrie Roof F 06/02/2017, 2:35 PM

## 2017-06-03 DIAGNOSIS — R6510 Systemic inflammatory response syndrome (SIRS) of non-infectious origin without acute organ dysfunction: Secondary | ICD-10-CM

## 2017-06-03 LAB — GLUCOSE, CAPILLARY
GLUCOSE-CAPILLARY: 104 mg/dL — AB (ref 65–99)
GLUCOSE-CAPILLARY: 122 mg/dL — AB (ref 65–99)
GLUCOSE-CAPILLARY: 185 mg/dL — AB (ref 65–99)
Glucose-Capillary: 104 mg/dL — ABNORMAL HIGH (ref 65–99)

## 2017-06-03 LAB — BASIC METABOLIC PANEL WITH GFR
Anion gap: 11 (ref 5–15)
BUN: 13 mg/dL (ref 6–20)
CO2: 18 mmol/L — ABNORMAL LOW (ref 22–32)
Calcium: 8.5 mg/dL — ABNORMAL LOW (ref 8.9–10.3)
Chloride: 107 mmol/L (ref 101–111)
Creatinine, Ser: 1.08 mg/dL (ref 0.61–1.24)
GFR calc Af Amer: >60 mL/min (ref >60)
GFR calc non Af Amer: >60 mL/min (ref >60)
Glucose, Bld: 117 mg/dL — ABNORMAL HIGH (ref 65–99)
Potassium: 4.1 mmol/L (ref 3.5–5.1)
Sodium: 136 mmol/L (ref 135–145)

## 2017-06-03 LAB — CBC
HEMATOCRIT: 30.9 % — AB (ref 39.0–52.0)
HEMOGLOBIN: 10.3 g/dL — AB (ref 13.0–17.0)
MCH: 29.6 pg (ref 26.0–34.0)
MCHC: 33.3 g/dL (ref 30.0–36.0)
MCV: 88.8 fL (ref 78.0–100.0)
Platelets: 259 10*3/uL (ref 150–400)
RBC: 3.48 MIL/uL — ABNORMAL LOW (ref 4.22–5.81)
RDW: 13.8 % (ref 11.5–15.5)
WBC: 7.8 10*3/uL (ref 4.0–10.5)

## 2017-06-03 MED ORDER — SENNA 8.6 MG PO TABS: 1.0000 | ORAL_TABLET | Freq: Every day | ORAL | 0 refills | Status: AC

## 2017-06-03 MED ORDER — BISACODYL 10 MG RE SUPP
10.0000 mg | Freq: Once | RECTAL | Status: AC
  Administered 2017-06-03: 10 mg via RECTAL
  Filled 2017-06-03: qty 1

## 2017-06-03 MED ORDER — POLYETHYLENE GLYCOL 3350 17 G PO PACK: 17.0000 g | PACK | Freq: Two times a day (BID) | ORAL | 0 refills | Status: AC

## 2017-06-03 MED ORDER — SENNA 8.6 MG PO TABS: 1.0000 | ORAL_TABLET | Freq: Every day | ORAL | 0 refills | Status: DC

## 2017-06-03 NOTE — Progress Notes (Signed)
Subjective:    Patient reports pain as mild to left knee and lower leg.  Tolerating PO's. Progress with PT. No CP or SOB.  Objective: Vital signs in last 24 hours: Temp:  [98.3 F (36.8 C)-98.5 F (36.9 C)] 98.4 F (36.9 C) (01/03 0517) Pulse Rate:  [95-110] 103 (01/03 0517) Resp:  [18-20] 18 (01/03 0517) BP: (141-157)/(71-75) 152/75 (01/03 0517) SpO2:  [99 %-100 %] 99 % (01/03 0517)  Intake/Output from previous day: 01/02 0701 - 01/03 0700 In: 3640.8 [P.O.:720; I.V.:2570.8; IV Piggyback:350] Out: 2425 [Urine:2425] Intake/Output this shift: No intake/output data recorded.  Recent Labs    06/03/17 0528  HGB 10.3*   Recent Labs    06/03/17 0528  WBC 7.8  RBC 3.48*  HCT 30.9*  PLT 259   Recent Labs    06/02/17 0517 06/03/17 0528  NA  --  136  K  --  4.1  CL  --  107  CO2  --  18*  BUN  --  13  CREATININE 1.15 1.08  GLUCOSE  --  117*  CALCIUM  --  8.5*   No results for input(s): LABPT, INR in the last 72 hours.  Alert and oriented x3. RRR, Lungs clear, BS x4. Left Calf soft and non tender. L knee dressing C/D/I. No DVT signs. Edema to left lower leg. No signs of infection or compartment syndrome. LLE grossly neurovascularly intact.   Assessment/Plan:   s/p Left TKA: Up with PT Reports he is ready for D/c Continue current care  SIRS: Medicine following Echo completed   STILWELL, BRYSON L 06/03/2017, 7:47 AM

## 2017-06-03 NOTE — Progress Notes (Signed)
Physical Therapy Treatment Patient Details Name: Jeremy Nicholson MRN: [id number] DOB: 01-Sep-1945 Today's Date: 06/03/2017    History of Present Illness Pt is a 72 year old male who presented to ED with fever, fall at home, and generalized weakness, admitted for SIRS and recently s/p L TKA 05/28/17    PT Comments    Pt ambulated in hallway again and performed LE exercises.  Pt reports L knee pain however appears better able to tolerate activity and exercises then last couple sessions.  Follow Up Recommendations  DC plan and follow up therapy as arranged by surgeon;Home health PT;Supervision for mobility/OOB     Equipment Recommendations  None recommended by PT    Recommendations for Other Services       Precautions / Restrictions Precautions Precautions: Fall;Knee Precaution Comments: reviewed applying KI and wearing with standing and mobility (able to remove at rest, in chair/bed) Required Braces or Orthoses: Knee Immobilizer - Left Knee Immobilizer - Left: On when out of bed or walking Restrictions Other Position/Activity Restrictions: WBAT    Mobility  Bed Mobility Overal bed mobility: Needs Assistance Bed Mobility: Supine to Sit     Supine to sit: Supervision     General bed mobility comments: pt self assisted with UEs  Transfers Overall transfer level: Needs assistance Equipment used: Rolling walker (2 wheeled) Transfers: Sit to/from Stand Sit to Stand: Min guard         General transfer comment: verbal cues for UE and LE positioning  Ambulation/Gait Ambulation/Gait assistance: Min guard Ambulation Distance (Feet): 100 Feet Assistive device: Rolling walker (2 wheeled) Gait Pattern/deviations: Step-to pattern;Decreased stance time - left;Antalgic Gait velocity: decr   General Gait Details: verbal cues for sequence, RW positioning, posture, step length; reports 5/10 L knee pain however tolerated distance well   Stairs            Wheelchair  Mobility    Modified Rankin (Stroke Patients Only)       Balance                                            Cognition Arousal/Alertness: Awake/alert Behavior During Therapy: WFL for tasks assessed/performed Overall Cognitive Status: Within Functional Limits for tasks assessed                                        Exercises Total Joint Exercises Ankle Circles/Pumps: AROM;Both;10 reps Quad Sets: AROM;Both;10 reps Short Arc QuadSinclair Ship;Left;10 reps Heel Slides: AAROM;Left;10 reps Hip ABduction/ADduction: AAROM;Left;10 reps Straight Leg Raises: AAROM;Left;10 reps    General Comments        Pertinent Vitals/Pain Pain Assessment: 0-10 Pain Score: 5  Pain Location: left knee Pain Descriptors / Indicators: Sore;Aching Pain Intervention(s): Limited activity within patient's tolerance;Repositioned;Monitored during session    Home Living                      Prior Function            PT Goals (current goals can now be found in the care plan section) Progress towards PT goals: Progressing toward goals    Frequency    Min 4X/week      PT Plan Current plan remains appropriate    Co-evaluation  AM-PAC PT "6 Clicks" Daily Activity  Outcome Measure  Difficulty turning over in bed (including adjusting bedclothes, sheets and blankets)?: A Little Difficulty moving from lying on back to sitting on the side of the bed? : A Lot Difficulty sitting down on and standing up from a chair with arms (e.g., wheelchair, bedside commode, etc,.)?: A Lot Help needed moving to and from a bed to chair (including a wheelchair)?: A Little Help needed walking in hospital room?: A Little Help needed climbing 3-5 steps with a railing? : A Little 6 Click Score: 16    End of Session Equipment Utilized During Treatment: Left knee immobilizer Activity Tolerance: Patient tolerated treatment well Patient left: with call bell/phone  within reach;in chair;with family/visitor present Nurse Communication: Mobility status PT Visit Diagnosis: Muscle weakness (generalized) (M62.81);Difficulty in walking, not elsewhere classified (R26.2)     Time: 1438-8875 PT Time Calculation (min) (ACUTE ONLY): 32 min  Charges:  $Gait Training: 8-22 mins $Therapeutic Exercise: 8-22 mins                    G Codes:      Carmelia Bake, PT, DPT 06/03/2017 Pager: [phone]  York Ram E 06/03/2017, 4:27 PM

## 2017-06-03 NOTE — Discharge Summary (Signed)
Physician Discharge Summary  Jeremy Nicholson YBO:[id number] DOB: 1946-03-20 DOA: 05/30/2017  PCP: Jeremy Hippo, MD  Admit date: 05/30/2017 Discharge date: 06/03/2017  Time spent: over 30 minutes  Recommendations for Outpatient Follow-up:  1. Follow up outpatient CBC/CMP (attention to H/H, downtrending, but suspect dilution) 2. Follow up with orthopedics as outpatient, follow up surgical wound 3. Follow up with outpatient cardiology regarding aortic stenosis  4. Follow up final blood cultures 5. Follow up UA, proteinuria here as inpatient 6. Of note, pt with 14 mm LN in R axilla on imaging, would f/u as outpatient    Discharge Diagnoses:  Principal Problem:   SIRS (systemic inflammatory response syndrome) (HCC) Active Problems:   S/P knee replacement   DM2 (diabetes mellitus, type 2) ([place])   HTN (hypertension)   Discharge Condition: stable  Diet recommendation: heart healthy  Filed Weights   05/30/17 2104 05/31/17 0417  Weight: 81.6 kg (180 lb) 80.9 kg (178 lb 5.6 oz)    History of present illness:  72 y.o.malewith medical history significant ofrecent left TKA on 12/28. Patient presents to the ED with fever and generalized weakness. He was doing well after the knee replacement. Some troubles with ambulation / balance since then. At home pt had increased confusion and fever per wife, last BM one week ago.   Hospital Course:  1. SIRS-unclear etiology. Orthopedics do not feel that patient has postop infection of left knee. UA is clear and Chest x-ray showed no infiltrate. Blood cultures time to are negative to date. Patient started empirically on vancomycin and Zosyn. Swelling of the left knee has improved. Will need to follow culture report. Repeat UA not suggestive of UTI.  Discharged patient on 1/3 with plan for outpatient orthopedics follow up.  Orthopedics placed Jeremy Nicholson on doxycycline to continue until outpatient follow up.   2. Heart murmur-patient has grade 3/6  systolic murmur at the mitral area. Pt says this was present since his teenage years.  1. 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  Moderate AS and insufficiency.  Will need to follow up with cardiology.   3. Diabetes mellitus- resume home meds at d/c  4. Constipation - placed on Miralax and senna.  Pt had BM prior to d/c.  5. Acute kidney injury - suspect pre renal in etiology, resolved   Procedures: Echo 1/2 Impressions:  - Possibly bicuspid aortic valve that is severely thickened an   calcified with severely restricted leaflet opening. Moderate   aortic stenosis and insufficiency.  Consultations:  orthopedics  Discharge Exam: Vitals:   06/03/17 0517 06/03/17 1517  BP: (!) 152/75 (!) 152/73  Pulse: (!) 103 (!) 111  Resp: 18 18  Temp: 98.4 F (36.9 C) 98.2 F (36.8 C)  SpO2: 99% 99%   No BM 7 days. Feeling better.   General: No acute distress. Cardiovascular: Heart sounds show a regular rate, and rhythm. No gallops or rubs. No murmurs. No JVD. Lungs: Clear to auscultation bilaterally with good air movement. No rales, rhonchi or wheezes. Abdomen: Soft, nontender, nondistended with normal active bowel sounds. No masses. No hepatosplenomegaly. Neurological: Alert and oriented 3. Moves all extremities 4 with equal strength. Cranial nerves II through XII grossly intact. Extremities: L knee with dressing, swelling, some blistering and redness Psychiatric: Mood and affect are normal. Insight and judgment are appropriate.   Discharge Instructions   Discharge Instructions    Call MD for:  difficulty breathing, headache or visual disturbances   Complete  by:  As directed    Call MD for:  extreme fatigue   Complete by:  As directed    Call MD for:  persistant dizziness or light-headedness   Complete by:  As directed    Call MD for:  persistant nausea and vomiting   Complete by:  As directed    Call MD for:   redness, tenderness, or signs of infection (pain, swelling, redness, odor or green/yellow discharge around incision site)   Complete by:  As directed    Call MD for:  severe uncontrolled pain   Complete by:  As directed    Call MD for:  temperature >100.4   Complete by:  As directed    Diet - low sodium heart healthy   Complete by:  As directed    Discharge instructions   Complete by:  As directed    You were seen for a fever and confusion.  Orthopedics has evaluated your knee and feel that you should be ok to follow up as an outpatient.  They recommended doxycycline (an antibiotic) until you see your orthopedic doctor in follow up.  Return if you have new, recurrent, or worsening symptoms.  Please follow up as scheduled with orthopedics.  Please follow up with your PCP as well.  We'll send you home with miralax twice daily as well as senna.  When you have regular normal bowel movements you can decrease the miralax to daily and stop the senna.  Your blood counts were slightly low at discharge, this may be because of the fluids you received, please follow up with your PCP for labs as an outpatient.  Please have your primary care doctor request records from this hospitalization so they know what was done.  You should follow up with your cardiologist as an outpatient regarding your aortic valve stenosis.   Increase activity slowly   Complete by:  As directed      Allergies as of 06/03/2017   No Known Allergies     Medication List    TAKE these medications   amLODipine 10 MG tablet Commonly known as:  NORVASC Take 10 mg by mouth daily.   aspirin EC 325 MG tablet Take 1 tablet (325 mg total) by mouth 2 (two) times daily.   capsaicin 0.025 % cream Commonly known as:  ZOSTRIX Apply 1 application topically 2 (two) times daily as needed (for knee).   Cholecalciferol 1000 units tablet Take 1,000 Units by mouth daily.   doxycycline 100 MG capsule Commonly known as:  VIBRAMYCIN Take 1 capsule  (100 mg total) by mouth 2 (two) times daily.   glipiZIDE 10 MG tablet Commonly known as:  GLUCOTROL Take 10 mg by mouth 2 (two) times daily.   ibuprofen 600 MG tablet Commonly known as:  ADVIL,MOTRIN Take 600 mg by mouth every 6 (six) hours as needed for headache or moderate pain.   JARDIANCE 10 MG Tabs tablet Generic drug:  empagliflozin Take 10 mg by mouth daily.   metFORMIN 500 MG tablet Commonly known as:  GLUCOPHAGE Take 1,000 mg by mouth 2 (two) times daily with a meal.   methocarbamol 500 MG tablet Commonly known as:  ROBAXIN Take 1 tablet (500 mg total) by mouth every 8 (eight) hours as needed for muscle spasms.   metoprolol tartrate 50 MG tablet Commonly known as:  LOPRESSOR Take 50 mg by mouth daily.   niacin 500 MG tablet Commonly known as:  SLO-NIACIN Take 500 mg by mouth daily.  oxyCODONE 5 MG immediate release tablet Commonly known as:  ROXICODONE Take 1 tablet (5 mg total) by mouth every 4 (four) hours as needed.   polyethylene glycol packet Commonly known as:  MIRALAX / GLYCOLAX Take 17 g by mouth 2 (two) times daily.   senna 8.6 MG Tabs tablet Commonly known as:  SENOKOT Take 1 tablet (8.6 mg total) by mouth at bedtime for 7 days.   tamsulosin 0.4 MG Caps capsule Commonly known as:  FLOMAX Take 0.4 mg by mouth at bedtime.      No Known Allergies Follow-up Information    [facility] Follow up.   Specialty:  [place]:  [facility] physical therapy Contact information: [place] [address] [phone]        [name], MD Follow up in 1 week(s).   Specialty:  Orthopedic Surgery Contact information: [address] [place] [address] [phone]            The results of significant diagnostics from this hospitalization (including imaging, microbiology, ancillary and laboratory) are listed below for reference.    Significant Diagnostic Studies: Dg Chest 2  View  Result Date: 05/31/2017 CLINICAL DATA:  Status post fall, with concern for chest injury. Initial encounter. EXAM: CHEST  2 VIEW COMPARISON:  None. FINDINGS: The lungs are well-aerated. There is mild elevation of the left hemidiaphragm. There is no evidence of focal opacification, pleural effusion or pneumothorax. The heart is normal in size; the mediastinal contour is within normal limits. No acute osseous abnormalities are seen. IMPRESSION: Mild elevation of the left hemidiaphragm. Lungs remain grossly clear. Electronically Signed   By: Garald Balding M.D.   On: 05/31/2017 00:03   Ct Head Wo Contrast  Result Date: 05/31/2017 CLINICAL DATA:  72 y/o  M; multiple falls tonight with head injury. EXAM: CT HEAD WITHOUT CONTRAST CT CERVICAL SPINE WITHOUT CONTRAST TECHNIQUE: Multidetector CT imaging of the head and cervical spine was performed following the standard protocol without intravenous contrast. Multiplanar CT image reconstructions of the cervical spine were also generated. COMPARISON:  None. FINDINGS: CT HEAD FINDINGS Brain: No evidence of acute infarction, hemorrhage, hydrocephalus, extra-axial collection or mass lesion/mass effect. Mild chronic microvascular ischemic changes and parenchymal volume loss of the brain. Vascular: Calcific atherosclerosis of carotid siphons. Skull: Normal. Negative for fracture or focal lesion. Sinuses/Orbits: Mild diffuse paranasal sinus mucosal thickening. Normal aeration of mastoid air cells. Orbits are unremarkable. Other: None. CT CERVICAL SPINE FINDINGS Alignment: Normal. Skull base and vertebrae: No acute fracture. No primary bone lesion or focal pathologic process. Soft tissues and spinal canal: No prevertebral fluid or swelling. No visible canal hematoma. Disc levels: Cervical spondylosis with mild multilevel discogenic degenerative changes, right C7-T1 and left upper cervical facet arthropathy. Upper chest: Mild biapical paraseptal emphysema. Other: Extensive  calcific atherosclerosis of carotid siphons. IMPRESSION: 1. No acute intracranial abnormality or calvarial fracture. 2. No acute fracture or dislocation of cervical spine. 3. Mild chronic microvascular ischemic changes and parenchymal volume loss of the brain. 4. Mild paranasal sinus disease. 5. Mild cervical spondylosis.  No high-grade bony canal stenosis. Electronically Signed   By: Kristine Garbe M.D.   On: 05/31/2017 00:31   Ct Angio Chest Pe W And/or Wo Contrast  Result Date: 05/31/2017 CLINICAL DATA:  Fever, weakness, tachycardia, elevated white cell count after total knee replacement. History of hypertension and diabetes. EXAM: CT ANGIOGRAPHY CHEST WITH CONTRAST TECHNIQUE: Multidetector CT imaging of the chest was  performed using the standard protocol during bolus administration of intravenous contrast. Multiplanar CT image reconstructions and MIPs were obtained to evaluate the vascular anatomy. CONTRAST:  100 mL Isovue 370 COMPARISON:  Chest radiograph 05/30/2017 FINDINGS: Cardiovascular: Good opacification of the central and segmental pulmonary arteries. No focal filling defects. No evidence of significant pulmonary embolus. Normal heart size. No pericardial effusion. Coronary artery calcifications. Normal caliber thoracic aorta. No dissection. Great vessel origins are patent. Aortic calcification. Mediastinum/Nodes: Esophagus is decompressed. No significant mediastinal lymphadenopathy. There is an enlarged right axillary lymph node measuring 14 mm short axis dimension. Etiology is nonspecific. This could represent inflammatory node although metastatic disease or lymphoma could also have this appearance. Lungs/Pleura: Atelectasis in the lung bases. No consolidation or edema. Mild emphysematous changes in the upper lungs. No pleural effusions. No pneumothorax. Airways are patent. Upper Abdomen: No acute abnormality. Musculoskeletal: No chest wall abnormality. No acute or significant osseous  findings. Diffuse degenerative changes of the thoracic spine with bridging anterior osteophytes. Review of the MIP images confirms the above findings. IMPRESSION: 1. No evidence of significant pulmonary embolus. 2. Coronary artery calcifications. 3. Nonspecific 14 mm enlarged lymph node in the right axilla. Physical examination may be useful in further evaluation. 4. Atelectasis in the lung bases. No evidence of active pulmonary disease. Mild pulmonary emphysema. Aortic Atherosclerosis (ICD10-I70.0) and Emphysema (ICD10-J43.9). Electronically Signed   By: Lucienne Capers M.D.   On: 05/31/2017 01:42   Ct Cervical Spine Wo Contrast  Result Date: 05/31/2017 CLINICAL DATA:  72 y/o  M; multiple falls tonight with head injury. EXAM: CT HEAD WITHOUT CONTRAST CT CERVICAL SPINE WITHOUT CONTRAST TECHNIQUE: Multidetector CT imaging of the head and cervical spine was performed following the standard protocol without intravenous contrast. Multiplanar CT image reconstructions of the cervical spine were also generated. COMPARISON:  None. FINDINGS: CT HEAD FINDINGS Brain: No evidence of acute infarction, hemorrhage, hydrocephalus, extra-axial collection or mass lesion/mass effect. Mild chronic microvascular ischemic changes and parenchymal volume loss of the brain. Vascular: Calcific atherosclerosis of carotid siphons. Skull: Normal. Negative for fracture or focal lesion. Sinuses/Orbits: Mild diffuse paranasal sinus mucosal thickening. Normal aeration of mastoid air cells. Orbits are unremarkable. Other: None. CT CERVICAL SPINE FINDINGS Alignment: Normal. Skull base and vertebrae: No acute fracture. No primary bone lesion or focal pathologic process. Soft tissues and spinal canal: No prevertebral fluid or swelling. No visible canal hematoma. Disc levels: Cervical spondylosis with mild multilevel discogenic degenerative changes, right C7-T1 and left upper cervical facet arthropathy. Upper chest: Mild biapical paraseptal  emphysema. Other: Extensive calcific atherosclerosis of carotid siphons. IMPRESSION: 1. No acute intracranial abnormality or calvarial fracture. 2. No acute fracture or dislocation of cervical spine. 3. Mild chronic microvascular ischemic changes and parenchymal volume loss of the brain. 4. Mild paranasal sinus disease. 5. Mild cervical spondylosis.  No high-grade bony canal stenosis. Electronically Signed   By: Kristine Garbe M.D.   On: 05/31/2017 00:31   Dg Knee Complete 4 Views Left  Result Date: 05/31/2017 CLINICAL DATA:  Status post fall, with left knee pain and deformity. Recent left knee replacement. Fever and left knee swelling and erythema. EXAM: LEFT KNEE - COMPLETE 4+ VIEW COMPARISON:  None. FINDINGS: There is no evidence of acute fracture or dislocation. The total knee arthroplasty appears grossly intact, without evidence of loosening. Prominent soft tissue air is seen tracking about the lateral aspect of the knee, along the lateral lower leg, and along the medial aspect of the left thigh. This may simply be  postoperative in nature. Would correlate clinically for evidence of infection with a gas producing organism. A small knee joint effusion is noted. Scattered vascular calcifications are seen. Diffuse soft tissue swelling is noted about the knee and lower thigh. IMPRESSION: 1. No evidence of fracture or dislocation. Total knee arthroplasty appears intact. 2. Prominent soft tissue air about the lateral aspect of the knee, along the lateral lower leg, and along the medial aspect of the left thigh. This may simply be postoperative in nature. Would correlate clinically for evidence of infection with a gas producing organism, or for signs of necrotizing fasciitis. 3. Small knee joint effusion. Diffuse soft tissue swelling about the knee and lower thigh. 4. Scattered vascular calcifications seen. These results were called by telephone at the time of interpretation on 05/31/2017 at 12:13 am to  Dr. Threasa Beards BELFI, who verbally acknowledged these results. Electronically Signed   By: Garald Balding M.D.   On: 05/31/2017 00:15   Dg Knee Complete 4 Views Right  Result Date: 05/30/2017 CLINICAL DATA:  Fall today. Knee pain and swelling. Initial encounter. EXAM: RIGHT KNEE - COMPLETE 4+ VIEW COMPARISON:  None. FINDINGS: No evidence of fracture, dislocation, or joint effusion. Moderate osteoarthritis involving the medial compartment. Enthesopathic changes seen involving the patella and anterior tibial tubercle. Peripheral vascular calcification also noted. IMPRESSION: No acute findings. Moderate medial compartment osteoarthritis. Electronically Signed   By: Earle Gell M.D.   On: 05/30/2017 21:35   Dg Knee Left Port  Result Date: 05/31/2017 CLINICAL DATA:  History of knee surgery on 05/28/2017. Evaluate air seen on knee radiographs performed 05/03/2017 EXAM: PORTABLE LEFT KNEE - 1-2 VIEW COMPARISON:  05/30/2017 FINDINGS: Post left total knee replacement. Alignment appears anatomic. No evidence of hardware failure loosening. No fracture or dislocation. Minimal enthesopathic change involving the superior pole of the patella. Re- demonstrated trace amount of intra-articular air and subcutaneous emphysema about the lateral aspect of the operative site, presumably postoperative in etiology. Adjacent vascular calcifications.  No radiopaque foreign body. IMPRESSION: 1. Post left total knee replacement with minimal amount of intra-articular air and subcutaneous emphysema, similar to the 05/30/2017 examination and presumably postoperative in etiology. 2. No evidence of hardware failure or loosening. Electronically Signed   By: Sandi Mariscal M.D.   On: 05/31/2017 09:23   Dg Abd 2 Views  Result Date: 06/02/2017 CLINICAL DATA:  Abdominal discomfort. No bowel movement in 7 day since knee surgery. EXAM: ABDOMEN - 2 VIEW COMPARISON:  Chest CT 05/31/2017.  Chest radiographs 05/30/2017. FINDINGS: The bowel gas pattern  is normal. Colonic stool burden does not appear significantly increased. There is no evidence of free intraperitoneal air or bowel wall thickening. Bilateral pelvic calcifications are likely phleboliths. There are scattered prominent enthesophytes in the spine and pelvis without definite ankylosis of the sacroiliac joints. No acute osseous findings are evident. There is aortoiliac atherosclerosis. IMPRESSION: 1. No acute abdominal findings. Colonic stool burden does not appear significantly increased. 2. 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 Electronically Signed   By: Richardean Sale M.D.   On: 06/02/2017 15:05    Microbiology: Recent Results (from the past 240 hour(s))  Blood Culture (routine x 2)     Status: None (Preliminary result)   Collection Time: 05/30/17 10:36 PM  Result Value Ref Range Status   Specimen Description BLOOD LEFT ANTECUBITAL  Final   Special Requests   Final    BOTTLES DRAWN AEROBIC AND ANAEROBIC Blood Culture adequate volume   Culture   Final  NO GROWTH 3 DAYS Performed at [facility], [place] [address], [place], [place] [address]    Report Status PENDING  Incomplete  Blood Culture (routine x 2)     Status: None (Preliminary result)   Collection Time: 05/30/17 10:38 PM  Result Value Ref Range Status   Specimen Description BLOOD LEFT FOREARM  Final   Special Requests IN PEDIATRIC BOTTLE Blood Culture adequate volume  Final   Culture   Final    NO GROWTH 3 DAYS Performed at [facility], [place] [address], [place], [place] [address]    Report Status PENDING  Incomplete     Labs: Basic Metabolic Panel: Recent Labs  Lab 05/29/17 0543 05/30/17 2219 05/31/17 0527 06/01/17 0536 06/02/17 0517 06/03/17 0528  NA 136 134* 136  --   --  136  K 4.3 4.2 4.2  --   --  4.1  CL 104 100* 104  --   --  107  CO2 23 21* 19*  --   --  18*  GLUCOSE 125* 152* 110*  --   --  117*  BUN 16 14 12   --   --  13  CREATININE 1.26*  1.25* 1.16 1.30* 1.15 1.08  CALCIUM 8.5* 9.4 8.8*  --   --  8.5*   Liver Function Tests: Recent Labs  Lab 05/30/17 2219  AST 31  ALT 17  ALKPHOS 76  BILITOT 1.6*  PROT 8.8*  ALBUMIN 4.0   No results for input(s): LIPASE, AMYLASE in the last 168 hours. No results for input(s): AMMONIA in the last 168 hours. CBC: Recent Labs  Lab 05/29/17 0543 05/30/17 0527 05/30/17 2219 05/31/17 0527 06/03/17 0528  WBC 9.4 7.9 10.2 10.0 7.8  NEUTROABS  --   --  8.2* 6.9  --   HGB 13.9 12.4* 14.2 12.7* 10.3*  HCT 42.6 38.6* 41.6 38.4* 30.9*  MCV 89.9 90.4 89.1 89.9 88.8  PLT 244 206 223 212 259   Cardiac Enzymes: No results for input(s): CKTOTAL, CKMB, CKMBINDEX, TROPONINI in the last 168 hours. BNP: BNP (last 3 results) No results for input(s): BNP in the last 8760 hours.  ProBNP (last 3 results) No results for input(s): PROBNP in the last 8760 hours.  CBG: Recent Labs  Lab 06/02/17 2044 06/03/17 0031 06/03/17 0523 06/03/17 0749 06/03/17 1141  GLUCAP 155* 104* 122* 104* 185*       Signed:  Fayrene Helper MD.  [facility] 06/03/2017, 7:39 PM

## 2017-06-03 NOTE — Care Management Important Message (Signed)
Important Message  Patient Details  Name: Noell Shular MRN: [id number] Date of Birth: [birth year]   Medicare Important Message Given:  Yes    Kerin Salen 06/03/2017, 11:14 AMImportant Message  Patient Details  Name: Talis Iwan MRN: [id number] Date of Birth: March 07, 1946   Medicare Important Message Given:  Yes    Kerin Salen 06/03/2017, 11:13 AM

## 2017-06-05 DIAGNOSIS — Z471 Aftercare following joint replacement surgery: Secondary | ICD-10-CM | POA: Diagnosis not present

## 2017-06-05 DIAGNOSIS — Z7984 Long term (current) use of oral hypoglycemic drugs: Secondary | ICD-10-CM | POA: Diagnosis not present

## 2017-06-05 DIAGNOSIS — I10 Essential (primary) hypertension: Secondary | ICD-10-CM | POA: Diagnosis not present

## 2017-06-05 DIAGNOSIS — Z7982 Long term (current) use of aspirin: Secondary | ICD-10-CM | POA: Diagnosis not present

## 2017-06-05 DIAGNOSIS — E119 Type 2 diabetes mellitus without complications: Secondary | ICD-10-CM | POA: Diagnosis not present

## 2017-06-05 DIAGNOSIS — Z9181 History of falling: Secondary | ICD-10-CM | POA: Diagnosis not present

## 2017-06-05 DIAGNOSIS — Z96652 Presence of left artificial knee joint: Secondary | ICD-10-CM | POA: Diagnosis not present

## 2017-06-05 LAB — CULTURE, BLOOD (ROUTINE X 2)
CULTURE: NO GROWTH
Culture: NO GROWTH
SPECIAL REQUESTS: ADEQUATE
SPECIAL REQUESTS: ADEQUATE

## 2017-06-07 DIAGNOSIS — Z96652 Presence of left artificial knee joint: Secondary | ICD-10-CM | POA: Diagnosis not present

## 2017-06-07 DIAGNOSIS — Z471 Aftercare following joint replacement surgery: Secondary | ICD-10-CM | POA: Diagnosis not present

## 2017-06-10 DIAGNOSIS — N189 Chronic kidney disease, unspecified: Secondary | ICD-10-CM | POA: Diagnosis not present

## 2017-06-10 DIAGNOSIS — Z96652 Presence of left artificial knee joint: Secondary | ICD-10-CM | POA: Diagnosis not present

## 2017-06-10 DIAGNOSIS — N289 Disorder of kidney and ureter, unspecified: Secondary | ICD-10-CM | POA: Diagnosis not present

## 2017-06-10 DIAGNOSIS — R590 Localized enlarged lymph nodes: Secondary | ICD-10-CM | POA: Diagnosis not present

## 2017-06-10 DIAGNOSIS — R011 Cardiac murmur, unspecified: Secondary | ICD-10-CM | POA: Diagnosis not present

## 2017-06-10 DIAGNOSIS — R6510 Systemic inflammatory response syndrome (SIRS) of non-infectious origin without acute organ dysfunction: Secondary | ICD-10-CM | POA: Diagnosis not present

## 2017-06-10 DIAGNOSIS — Z09 Encounter for follow-up examination after completed treatment for conditions other than malignant neoplasm: Secondary | ICD-10-CM | POA: Diagnosis not present

## 2017-06-13 NOTE — Discharge Summary (Signed)
Physician Discharge Summary  Patient ID: Jeremy Nicholson MRN: [id number] DOB/AGE: 71/09/1945 71 y.o.  Admit date: 05/28/2017 Discharge date: 05/30/17  Admission Diagnoses: knee oa  Discharge Diagnoses:  Active Problems:   S/P knee replacement   Discharged Condition: good  Hospital Course:  Jeremy Nicholson is a 72 y.o. who was admitted to [facility]. They were brought to the operating room on 05/28/2017 and underwent Procedure(s): LEFT TOTAL KNEE ARTHROPLASTY.  Patient tolerated the procedure well and was later transferred to the recovery room and then to the orthopaedic floor for postoperative care.  They were given PO and IV analgesics for pain control following their surgery.  They were given 24 hours of postoperative antibiotics of  Anti-infectives (From admission, onward)   Start     Dose/Rate Route Frequency Ordered Stop   05/28/17 2000  ceFAZolin (ANCEF) IVPB 2g/100 mL premix     2 g 200 mL/hr over 30 Minutes Intravenous Every 6 hours 05/28/17 1829 05/29/17 0205   05/28/17 1119  ceFAZolin (ANCEF) IVPB 2g/100 mL premix     2 g 200 mL/hr over 30 Minutes Intravenous On call to O.R. 05/28/17 1119 05/28/17 1359     and started on DVT prophylaxis in the form of lovenox.   PT and OT were ordered for total joint protocol.  Discharge planning consulted to help with postop disposition and equipment needs.  Patient had a good night on the evening of surgery and started to get up OOB with therapy on day one.  Hemovac drain was pulled without difficulty.  Continued to work with therapy into day two.  Dressing was with normal limits.  The patient had progressed with therapy and meeting their goals. Patient was seen in rounds and was ready to go home.  Consults: n/a Significant Diagnostic Studies: routine  Treatments:routine  Discharge Exam: Blood pressure 134/72, pulse 95, temperature 98.4 F (36.9 C), temperature source Oral, resp. rate 16, height 5\' 9"  (1.753 m), weight 80.3  kg (177 lb), SpO2 96 %. Alert and oriented x3. RRR, Lungs clear, BS x4. Left Calf soft and non tender. L knee dressing C/D/I. No DVT signs. No signs of infection or compartment syndrome. LLE grossly neurovascularly intact.   Disposition: 06-Home-Health Care Svc  Discharge Instructions    Call MD / Call 911   Complete by:  As directed    If you experience chest pain or shortness of breath, CALL 911 and be transported to the hospital emergency room.  If you develope a fever above 101 F, pus (white drainage) or increased drainage or redness at the wound, or calf pain, call your surgeon's office.   Call MD / Call 911   Complete by:  As directed    If you experience chest pain or shortness of breath, CALL 911 and be transported to the hospital emergency room.  If you develope a fever above 101 F, pus (white drainage) or increased drainage or redness at the wound, or calf pain, call your surgeon's office.   Constipation Prevention   Complete by:  As directed    Drink plenty of fluids.  Prune juice may be helpful.  You may use a stool softener, such as Colace (over the counter) 100 mg twice a day.  Use MiraLax (over the counter) for constipation as needed.   Constipation Prevention   Complete by:  As directed    Drink plenty of fluids.  Prune juice may be helpful.  You may use a stool softener, such as Colace (  over the counter) 100 mg twice a day.  Use MiraLax (over the counter) for constipation as needed.   Diet - low sodium heart healthy   Complete by:  As directed    Diet - low sodium heart healthy   Complete by:  As directed    Discharge instructions   Complete by:  As directed    INSTRUCTIONS AFTER JOINT REPLACEMENT   Remove items at home which could result in a fall. This includes throw rugs or furniture in walking pathways ICE to the affected joint every three hours while awake for 30 minutes at a time, for at least the first 3-5 days, and then as needed for pain and swelling.  Continue  to use ice for pain and swelling. You may notice swelling that will progress down to the foot and ankle.  This is normal after surgery.  Elevate your leg when you are not up walking on it.   Continue to use the breathing machine you got in the hospital (incentive spirometer) which will help keep your temperature down.  It is common for your temperature to cycle up and down following surgery, especially at night when you are not up moving around and exerting yourself.  The breathing machine keeps your lungs expanded and your temperature down.   DIET:  As you were doing prior to hospitalization, we recommend a well-balanced diet.  DRESSING / WOUND CKeep the surgical dressing until follow up.  The dressing is water proof, so you can shower without any extra covering.  IF THE DRESSING FALLS OFF or the wound gets wet inside, change the dressing with sterile gauze.  Please use good hand washing techniques before changing the dressing.  Do not use any lotions or creams on the incision until instructed by your surgeon.  ARE / SHOWERING    ACTIVITY  Increase activity slowly as tolerated, but follow the weight bearing instructions below.   No driving for 6 weeks or until further direction given by your physician.  You cannot drive while taking narcotics.  No lifting or carrying greater than 10 lbs. until further directed by your surgeon. Avoid periods of inactivity such as sitting longer than an hour when not asleep. This helps prevent blood clots.  You may return to work once you are authorized by your doctor.     WEIGHT BEARING   Weight bearing as tolerated with assist device (walker, cane, etc) as directed, use it as long as suggested by your surgeon or therapist, typically at least 4-6 weeks.   EXERCISES  Results after joint replacement surgery are often greatly improved when you follow the exercise, range of motion and muscle strengthening exercises prescribed by your doctor. Safety measures  are also important to protect the joint from further injury. Any time any of these exercises cause you to have increased pain or swelling, decrease what you are doing until you are comfortable again and then slowly increase them. If you have problems or questions, call your caregiver or physical therapist for advice.   Rehabilitation is important following a joint replacement. After just a few days of immobilization, the muscles of the leg can become weakened and shrink (atrophy).  These exercises are designed to build up the tone and strength of the thigh and leg muscles and to improve motion. Often times heat used for twenty to thirty minutes before working out will loosen up your tissues and help with improving the range of motion but do not use heat for the  first two weeks following surgery (sometimes heat can increase post-operative swelling).   These exercises can be done on a training (exercise) mat, on the floor, on a table or on a bed. Use whatever works the best and is most comfortable for you.    Use music or television while you are exercising so that the exercises are a pleasant break in your day. This will make your life better with the exercises acting as a break in your routine that you can look forward to.   Perform all exercises about fifteen times, three times per day or as directed.  You should exercise both the operative leg and the other leg as well.   Exercises include:   Quad Sets - Tighten up the muscle on the front of the thigh (Quad) and hold for 5-10 seconds.   Straight Leg Raises - With your knee straight (if you were given a brace, keep it on), lift the leg to 60 degrees, hold for 3 seconds, and slowly lower the leg.  Perform this exercise against resistance later as your leg gets stronger.  Leg Slides: Lying on your back, slowly slide your foot toward your buttocks, bending your knee up off the floor (only go as far as is comfortable). Then slowly slide your foot back down  until your leg is flat on the floor again.  Angel Wings: Lying on your back spread your legs to the side as far apart as you can without causing discomfort.  Hamstring Strength:  Lying on your back, push your heel against the floor with your leg straight by tightening up the muscles of your buttocks.  Repeat, but this time bend your knee to a comfortable angle, and push your heel against the floor.  You may put a pillow under the heel to make it more comfortable if necessary.   A rehabilitation program following joint replacement surgery can speed recovery and prevent re-injury in the future due to weakened muscles. Contact your doctor or a physical therapist for more information on knee rehabilitation.    CONSTIPATION  Constipation is defined medically as fewer than three stools per week and severe constipation as less than one stool per week.  Even if you have a regular bowel pattern at home, your normal regimen is likely to be disrupted due to multiple reasons following surgery.  Combination of anesthesia, postoperative narcotics, change in appetite and fluid intake all can affect your bowels.   YOU MUST use at least one of the following options; they are listed in order of increasing strength to get the job done.  They are all available over the counter, and you may need to use some, POSSIBLY even all of these options:    Drink plenty of fluids (prune juice may be helpful) and high fiber foods Colace 100 mg by mouth twice a day  Senokot for constipation as directed and as needed Dulcolax (bisacodyl), take with full glass of water  Miralax (polyethylene glycol) once or twice a day as needed.  If you have tried all these things and are unable to have a bowel movement in the first 3-4 days after surgery call either your surgeon or your primary doctor.    If you experience loose stools or diarrhea, hold the medications until you stool forms back up.  If your symptoms do not get better within 1  week or if they get worse, check with your doctor.  If you experience "the worst abdominal pain ever" or develop nausea  or vomiting, please contact the office immediately for further recommendations for treatment.   ITCHING:  If you experience itching with your medications, try taking only a single pain pill, or even half a pain pill at a time.  You can also use Benadryl over the counter for itching or also to help with sleep.   TED HOSE STOCKINGS:  Use stockings on both legs until for at least 2 weeks or as directed by physician office. They may be removed at night for sleeping.  MEDICATIONS:  See your medication summary on the "After Visit Summary" that nursing will review with you.  You may have some home medications which will be placed on hold until you complete the course of blood thinner medication.  It is important for you to complete the blood thinner medication as prescribed.  PRECAUTIONS:  If you experience chest pain or shortness of breath - call 911 immediately for transfer to the hospital emergency department.   If you develop a fever greater that 101 F, purulent drainage from wound, increased redness or drainage from wound, foul odor from the wound/dressing, or calf pain - CONTACT YOUR SURGEON.                                                   FOLLOW-UP APPOINTMENTS:  If you do not already have a post-op appointment, please call the office for an appointment to be seen by your surgeon.  Guidelines for how soon to be seen are listed in your "After Visit Summary", but are typically between 1-4 weeks after surgery.  OTHER INSTRUCTIONS:   Knee Replacement:  Do not place pillow under knee, focus on keeping the knee straight while resting. CPM instructions: 0-90 degrees, 2 hours in the morning, 2 hours in the afternoon, and 2 hours in the evening. Place foam block, curve side up under heel at all times except when in CPM or when walking.  DO NOT modify, tear, cut, or change the foam block  in any way.  MAKE SURE YOU:  Understand these instructions.  Get help right away if you are not doing well or get worse.    Thank you for letting us be a part of your medical care team.  It is a privilege we respect greatly.  We hope these instructions will help you stay on track for a fast and full recovery!   Increase activity slowly as tolerated   Complete by:  As directed    Increase activity slowly as tolerated   Complete by:  As directed      Allergies as of 05/30/2017   No Known Allergies     Medication List    TAKE these medications   amLODipine 10 MG tablet Commonly known as:  NORVASC Take 10 mg by mouth daily.   aspirin EC 325 MG tablet Take 1 tablet (325 mg total) by mouth 2 (two) times daily. What changed:    medication strength  how much to take  when to take this   capsaicin 0.025 % cream Commonly known as:  ZOSTRIX Apply 1 application topically 2 (two) times daily as needed (for knee).   Cholecalciferol 1000 units tablet Take 1,000 Units by mouth daily.   glipiZIDE 10 MG tablet Commonly known as:  GLUCOTROL Take 10 mg by mouth 2 (two) times daily.   ibuprofen 600  MG tablet Commonly known as:  ADVIL,MOTRIN Take 600 mg by mouth every 6 (six) hours as needed for headache or moderate pain.   JARDIANCE 10 MG Tabs tablet Generic drug:  empagliflozin Take 10 mg by mouth daily.   metFORMIN 500 MG tablet Commonly known as:  GLUCOPHAGE Take 1,000 mg by mouth 2 (two) times daily with a meal.   methocarbamol 500 MG tablet Commonly known as:  ROBAXIN Take 1 tablet (500 mg total) by mouth every 8 (eight) hours as needed for muscle spasms.   metoprolol tartrate 50 MG tablet Commonly known as:  LOPRESSOR Take 50 mg by mouth daily.   niacin 500 MG tablet Commonly known as:  SLO-NIACIN Take 500 mg by mouth daily.   oxyCODONE 5 MG immediate release tablet Commonly known as:  ROXICODONE Take 1 tablet (5 mg total) by mouth every 4 (four) hours as  needed.   tamsulosin 0.4 MG Caps capsule Commonly known as:  FLOMAX Take 0.4 mg by mouth at bedtime.        SignedLajean Manes 06/13/2017, 4:55 PM

## 2017-06-16 DIAGNOSIS — Z7984 Long term (current) use of oral hypoglycemic drugs: Secondary | ICD-10-CM | POA: Diagnosis not present

## 2017-06-16 DIAGNOSIS — E1159 Type 2 diabetes mellitus with other circulatory complications: Secondary | ICD-10-CM | POA: Diagnosis not present

## 2017-06-16 DIAGNOSIS — R350 Frequency of micturition: Secondary | ICD-10-CM | POA: Diagnosis not present

## 2017-06-16 DIAGNOSIS — R6510 Systemic inflammatory response syndrome (SIRS) of non-infectious origin without acute organ dysfunction: Secondary | ICD-10-CM | POA: Diagnosis not present

## 2017-06-16 DIAGNOSIS — R6883 Chills (without fever): Secondary | ICD-10-CM | POA: Diagnosis not present

## 2017-06-16 DIAGNOSIS — Z794 Long term (current) use of insulin: Secondary | ICD-10-CM | POA: Diagnosis not present

## 2017-06-16 DIAGNOSIS — Z96659 Presence of unspecified artificial knee joint: Secondary | ICD-10-CM | POA: Diagnosis not present

## 2017-06-16 DIAGNOSIS — R634 Abnormal weight loss: Secondary | ICD-10-CM | POA: Diagnosis not present

## 2017-06-16 DIAGNOSIS — R410 Disorientation, unspecified: Secondary | ICD-10-CM | POA: Diagnosis not present

## 2017-06-16 DIAGNOSIS — R630 Anorexia: Secondary | ICD-10-CM | POA: Diagnosis not present

## 2017-06-18 DIAGNOSIS — R531 Weakness: Secondary | ICD-10-CM | POA: Diagnosis not present

## 2017-06-18 DIAGNOSIS — Z96659 Presence of unspecified artificial knee joint: Secondary | ICD-10-CM | POA: Diagnosis not present

## 2017-06-18 DIAGNOSIS — R6510 Systemic inflammatory response syndrome (SIRS) of non-infectious origin without acute organ dysfunction: Secondary | ICD-10-CM | POA: Diagnosis not present

## 2017-06-18 DIAGNOSIS — R630 Anorexia: Secondary | ICD-10-CM | POA: Diagnosis not present

## 2017-06-18 DIAGNOSIS — E1159 Type 2 diabetes mellitus with other circulatory complications: Secondary | ICD-10-CM | POA: Diagnosis not present

## 2017-06-18 DIAGNOSIS — Z794 Long term (current) use of insulin: Secondary | ICD-10-CM | POA: Diagnosis not present

## 2017-06-18 DIAGNOSIS — R6883 Chills (without fever): Secondary | ICD-10-CM | POA: Diagnosis not present

## 2017-06-23 DIAGNOSIS — M25662 Stiffness of left knee, not elsewhere classified: Secondary | ICD-10-CM | POA: Diagnosis not present

## 2017-07-02 HISTORY — PX: PROSTATE BIOPSY: SHX241

## 2017-07-06 DIAGNOSIS — Z794 Long term (current) use of insulin: Secondary | ICD-10-CM | POA: Diagnosis not present

## 2017-07-06 DIAGNOSIS — E1159 Type 2 diabetes mellitus with other circulatory complications: Secondary | ICD-10-CM | POA: Diagnosis not present

## 2017-07-06 DIAGNOSIS — I10 Essential (primary) hypertension: Secondary | ICD-10-CM | POA: Diagnosis not present

## 2017-07-06 DIAGNOSIS — Z96659 Presence of unspecified artificial knee joint: Secondary | ICD-10-CM | POA: Diagnosis not present

## 2017-07-06 DIAGNOSIS — I739 Peripheral vascular disease, unspecified: Secondary | ICD-10-CM | POA: Diagnosis not present

## 2017-07-06 DIAGNOSIS — E1165 Type 2 diabetes mellitus with hyperglycemia: Secondary | ICD-10-CM | POA: Diagnosis not present

## 2017-07-06 DIAGNOSIS — D473 Essential (hemorrhagic) thrombocythemia: Secondary | ICD-10-CM | POA: Diagnosis not present

## 2017-07-12 DIAGNOSIS — M25662 Stiffness of left knee, not elsewhere classified: Secondary | ICD-10-CM | POA: Diagnosis not present

## 2017-07-13 DIAGNOSIS — R3915 Urgency of urination: Secondary | ICD-10-CM | POA: Diagnosis not present

## 2017-07-13 DIAGNOSIS — N5201 Erectile dysfunction due to arterial insufficiency: Secondary | ICD-10-CM | POA: Diagnosis not present

## 2017-07-13 DIAGNOSIS — R972 Elevated prostate specific antigen [PSA]: Secondary | ICD-10-CM | POA: Diagnosis not present

## 2017-07-20 DIAGNOSIS — M25662 Stiffness of left knee, not elsewhere classified: Secondary | ICD-10-CM | POA: Diagnosis not present

## 2017-07-22 DIAGNOSIS — M25662 Stiffness of left knee, not elsewhere classified: Secondary | ICD-10-CM | POA: Diagnosis not present

## 2017-07-27 DIAGNOSIS — M25662 Stiffness of left knee, not elsewhere classified: Secondary | ICD-10-CM | POA: Diagnosis not present

## 2017-08-02 DIAGNOSIS — M25662 Stiffness of left knee, not elsewhere classified: Secondary | ICD-10-CM | POA: Diagnosis not present

## 2017-08-03 DIAGNOSIS — M25662 Stiffness of left knee, not elsewhere classified: Secondary | ICD-10-CM | POA: Diagnosis not present

## 2017-08-16 DIAGNOSIS — Z4789 Encounter for other orthopedic aftercare: Secondary | ICD-10-CM | POA: Diagnosis not present

## 2017-08-16 DIAGNOSIS — M1712 Unilateral primary osteoarthritis, left knee: Secondary | ICD-10-CM | POA: Diagnosis not present

## 2017-08-17 DIAGNOSIS — C61 Malignant neoplasm of prostate: Secondary | ICD-10-CM | POA: Diagnosis not present

## 2017-08-17 DIAGNOSIS — R972 Elevated prostate specific antigen [PSA]: Secondary | ICD-10-CM | POA: Diagnosis not present

## 2017-08-31 DIAGNOSIS — N3000 Acute cystitis without hematuria: Secondary | ICD-10-CM | POA: Diagnosis not present

## 2017-09-03 DIAGNOSIS — Z794 Long term (current) use of insulin: Secondary | ICD-10-CM | POA: Diagnosis not present

## 2017-09-03 DIAGNOSIS — E119 Type 2 diabetes mellitus without complications: Secondary | ICD-10-CM | POA: Diagnosis not present

## 2017-09-03 DIAGNOSIS — R339 Retention of urine, unspecified: Secondary | ICD-10-CM | POA: Diagnosis not present

## 2017-09-03 DIAGNOSIS — Z91040 Latex allergy status: Secondary | ICD-10-CM | POA: Diagnosis not present

## 2017-09-03 DIAGNOSIS — E785 Hyperlipidemia, unspecified: Secondary | ICD-10-CM | POA: Diagnosis not present

## 2017-09-03 DIAGNOSIS — I10 Essential (primary) hypertension: Secondary | ICD-10-CM | POA: Diagnosis not present

## 2017-09-09 DIAGNOSIS — N5201 Erectile dysfunction due to arterial insufficiency: Secondary | ICD-10-CM | POA: Diagnosis not present

## 2017-09-09 DIAGNOSIS — C61 Malignant neoplasm of prostate: Secondary | ICD-10-CM | POA: Diagnosis not present

## 2017-09-09 DIAGNOSIS — N3000 Acute cystitis without hematuria: Secondary | ICD-10-CM | POA: Diagnosis not present

## 2017-09-09 DIAGNOSIS — R338 Other retention of urine: Secondary | ICD-10-CM | POA: Diagnosis not present

## 2017-09-29 ENCOUNTER — Encounter (HOSPITAL_COMMUNITY): Payer: Self-pay | Admitting: *Deleted

## 2017-09-29 ENCOUNTER — Other Ambulatory Visit: Payer: Self-pay

## 2017-09-29 ENCOUNTER — Ambulatory Visit: Payer: Self-pay | Admitting: Orthopedic Surgery

## 2017-09-29 NOTE — Progress Notes (Addendum)
Spoke with dr green anesthesia and made aware echo results 06-02-17 and dr green stated patient needs cardiac clearance, but since patient late add on patient to be evaluated by anesthesia in am 09-30-17. Made sherry wills aware of dr green recommendations and she will make dr Theda Sers aware of dr greens recommendations.

## 2017-09-30 ENCOUNTER — Ambulatory Visit (HOSPITAL_COMMUNITY): Payer: PPO | Admitting: Anesthesiology

## 2017-09-30 ENCOUNTER — Other Ambulatory Visit: Payer: Self-pay

## 2017-09-30 ENCOUNTER — Encounter (HOSPITAL_COMMUNITY)
Admission: RE | Disposition: A | Discharge status: Home / Self Care | Payer: Self-pay | Source: Ambulatory Visit | Attending: Specialist

## 2017-09-30 ENCOUNTER — Encounter (HOSPITAL_COMMUNITY): Payer: Self-pay | Admitting: Emergency Medicine

## 2017-09-30 ENCOUNTER — Ambulatory Visit (HOSPITAL_COMMUNITY)
Admission: RE | Admit: 2017-09-30 | Discharge: 2017-09-30 | Disposition: A | Discharge status: Home / Self Care | Payer: PPO | Source: Ambulatory Visit | Attending: Specialist | Admitting: Specialist

## 2017-09-30 DIAGNOSIS — Z794 Long term (current) use of insulin: Secondary | ICD-10-CM | POA: Insufficient documentation

## 2017-09-30 DIAGNOSIS — E119 Type 2 diabetes mellitus without complications: Secondary | ICD-10-CM | POA: Diagnosis not present

## 2017-09-30 DIAGNOSIS — I10 Essential (primary) hypertension: Secondary | ICD-10-CM | POA: Insufficient documentation

## 2017-09-30 DIAGNOSIS — Z7982 Long term (current) use of aspirin: Secondary | ICD-10-CM | POA: Insufficient documentation

## 2017-09-30 DIAGNOSIS — M24662 Ankylosis, left knee: Secondary | ICD-10-CM | POA: Insufficient documentation

## 2017-09-30 DIAGNOSIS — R011 Cardiac murmur, unspecified: Secondary | ICD-10-CM | POA: Insufficient documentation

## 2017-09-30 DIAGNOSIS — R6510 Systemic inflammatory response syndrome (SIRS) of non-infectious origin without acute organ dysfunction: Secondary | ICD-10-CM | POA: Diagnosis not present

## 2017-09-30 DIAGNOSIS — Z96652 Presence of left artificial knee joint: Secondary | ICD-10-CM | POA: Insufficient documentation

## 2017-09-30 DIAGNOSIS — Z79899 Other long term (current) drug therapy: Secondary | ICD-10-CM | POA: Diagnosis not present

## 2017-09-30 DIAGNOSIS — M25662 Stiffness of left knee, not elsewhere classified: Secondary | ICD-10-CM | POA: Diagnosis not present

## 2017-09-30 DIAGNOSIS — M1990 Unspecified osteoarthritis, unspecified site: Secondary | ICD-10-CM | POA: Diagnosis not present

## 2017-09-30 DIAGNOSIS — Z91040 Latex allergy status: Secondary | ICD-10-CM | POA: Insufficient documentation

## 2017-09-30 DIAGNOSIS — T8482XS Fibrosis due to internal orthopedic prosthetic devices, implants and grafts, sequela: Secondary | ICD-10-CM

## 2017-09-30 DIAGNOSIS — I352 Nonrheumatic aortic (valve) stenosis with insufficiency: Secondary | ICD-10-CM | POA: Diagnosis not present

## 2017-09-30 HISTORY — PX: EXAM UNDER ANESTHESIA WITH MANIPULATION OF KNEE: SHX5816

## 2017-09-30 HISTORY — DX: Other complications of anesthesia, initial encounter: T88.59XA

## 2017-09-30 HISTORY — DX: Adverse effect of unspecified anesthetic, initial encounter: T41.45XA

## 2017-09-30 LAB — CBC
HEMATOCRIT: 42.8 % (ref 39.0–52.0)
HEMOGLOBIN: 14 g/dL (ref 13.0–17.0)
MCH: 29.2 pg (ref 26.0–34.0)
MCHC: 32.7 g/dL (ref 30.0–36.0)
MCV: 89.2 fL (ref 78.0–100.0)
Platelets: 297 10*3/uL (ref 150–400)
RBC: 4.8 MIL/uL (ref 4.22–5.81)
RDW: 14.2 % (ref 11.5–15.5)
WBC: 7 10*3/uL (ref 4.0–10.5)

## 2017-09-30 LAB — GLUCOSE, CAPILLARY
GLUCOSE-CAPILLARY: 122 mg/dL — AB (ref 65–99)
Glucose-Capillary: 133 mg/dL — ABNORMAL HIGH (ref 65–99)

## 2017-09-30 SURGERY — MANIPULATION, JOINT, KNEE, WITH ANESTHESIA
Anesthesia: Monitor Anesthesia Care | Site: Knee | Laterality: Left

## 2017-09-30 MED ORDER — MIDAZOLAM HCL 5 MG/5ML IJ SOLN
INTRAMUSCULAR | Status: DC | PRN
  Administered 2017-09-30: 2 mg via INTRAVENOUS

## 2017-09-30 MED ORDER — PROPOFOL 10 MG/ML IV BOLUS
INTRAVENOUS | Status: AC
  Filled 2017-09-30: qty 40

## 2017-09-30 MED ORDER — LIDOCAINE HCL (CARDIAC) PF 100 MG/5ML IV SOSY
PREFILLED_SYRINGE | INTRAVENOUS | Status: DC | PRN
  Administered 2017-09-30: 70 mg via INTRAVENOUS

## 2017-09-30 MED ORDER — CHLORHEXIDINE GLUCONATE 4 % EX LIQD: 60.0000 mL | Freq: Once | CUTANEOUS | Status: DC

## 2017-09-30 MED ORDER — FENTANYL CITRATE (PF) 100 MCG/2ML IJ SOLN
INTRAMUSCULAR | Status: DC | PRN
  Administered 2017-09-30: 50 ug via INTRAVENOUS

## 2017-09-30 MED ORDER — PHENYLEPHRINE HCL 10 MG/ML IJ SOLN
INTRAMUSCULAR | Status: DC | PRN
  Administered 2017-09-30: 40 ug via INTRAVENOUS

## 2017-09-30 MED ORDER — FENTANYL CITRATE (PF) 100 MCG/2ML IJ SOLN: 25.0000 ug | INTRAMUSCULAR | Status: DC | PRN

## 2017-09-30 MED ORDER — LACTATED RINGERS IV SOLN
INTRAVENOUS | Status: DC | PRN
Start: 2017-09-30 — End: 2017-09-30
  Administered 2017-09-30: 07:00:00 via INTRAVENOUS

## 2017-09-30 MED ORDER — ONDANSETRON HCL 4 MG/2ML IJ SOLN
INTRAMUSCULAR | Status: DC | PRN
Start: 2017-09-30 — End: 2017-09-30
  Administered 2017-09-30: 4 mg via INTRAVENOUS

## 2017-09-30 MED ORDER — PROPOFOL 10 MG/ML IV BOLUS
INTRAVENOUS | Status: DC | PRN
  Administered 2017-09-30: 170 mg via INTRAVENOUS

## 2017-09-30 MED ORDER — MIDAZOLAM HCL 2 MG/2ML IJ SOLN
INTRAMUSCULAR | Status: AC
Start: 2017-09-30 — End: 2017-09-30
  Filled 2017-09-30: qty 2

## 2017-09-30 MED ORDER — FENTANYL CITRATE (PF) 100 MCG/2ML IJ SOLN
INTRAMUSCULAR | Status: AC
  Filled 2017-09-30: qty 2

## 2017-09-30 MED ORDER — ONDANSETRON HCL 4 MG/2ML IJ SOLN: 4.0000 mg | Freq: Once | INTRAMUSCULAR | Status: DC | PRN

## 2017-09-30 SURGICAL SUPPLY — 13 items
BANDAGE ADH SHEER 1  50/CT (GAUZE/BANDAGES/DRESSINGS) IMPLANT
COVER SURGICAL LIGHT HANDLE (MISCELLANEOUS) IMPLANT
GAUZE SPONGE 4X4 12PLY STRL (GAUZE/BANDAGES/DRESSINGS) IMPLANT
GLOVE BIO SURGEON STRL SZ7.5 (GLOVE) IMPLANT
GLOVE BIOGEL PI IND STRL 8 (GLOVE) IMPLANT
GLOVE BIOGEL PI INDICATOR 8 (GLOVE)
GLOVE SURG SS PI 7.5 STRL IVOR (GLOVE) IMPLANT
GOWN STRL REUS W/TWL XL LVL3 (GOWN DISPOSABLE) IMPLANT
NDL SAFETY ECLIPSE 18X1.5 (NEEDLE) IMPLANT
NEEDLE HYPO 18GX1.5 SHARP (NEEDLE)
POSITIONER SURGICAL ARM (MISCELLANEOUS) IMPLANT
SYR CONTROL 10ML LL (SYRINGE) IMPLANT
TOWEL OR 17X26 10 PK STRL BLUE (TOWEL DISPOSABLE) IMPLANT

## 2017-09-30 NOTE — Progress Notes (Signed)
Blood refusal form completed due to pt being jehovah's witness. Pt is okay with "salvage blood", okay with albumin, just not whole blood products.  Pt signed form along with two witness (myself and Ardelle Lesches.)  Pt was originally scheduled at the surgical center of [place].  Pt sees cardiologist at the [facility] in [place], no forms in care anywhere found for cardiac clearance. Dr. Roanna Banning and Rolla Plate, CRNA informed.  Also called [facility] to see if they had received any cardiac clearance and they updated [place] that they did not have any in their chart. Dr. Roanna Banning updated.

## 2017-09-30 NOTE — Interval H&P Note (Signed)
History and Physical Interval Note:  09/30/2017 7:36 AM  Jeremy Nicholson  has presented today for surgery, with the diagnosis of Left knee arthrofibrosis  The various methods of treatment have been discussed with the patient and family. After consideration of risks, benefits and other options for treatment, the patient has consented to  Procedure(s) with comments: EXAM UNDER ANESTHESIA WITH MANIPULATION OF KNEE (Left) - 30 mins as a surgical intervention .  The patient's history has been reviewed, patient examined, no change in status, stable for surgery.  I have reviewed the patient's chart and labs.  Questions were answered to the patient's satisfaction.     [name] ANDREW

## 2017-09-30 NOTE — Transfer of Care (Signed)
Immediate Anesthesia Transfer of Care Note  Patient: Jeremy Nicholson  Procedure(s) Performed: EXAM UNDER ANESTHESIA WITH MANIPULATION OF KNEE (Left Knee)  Patient Location: PACU  Anesthesia Type:MAC  Level of Consciousness: awake, alert  and oriented  Airway & Oxygen Therapy: Patient Spontanous Breathing and Patient connected to face mask oxygen  Post-op Assessment: Report given to RN  Post vital signs: Reviewed and stable  Last Vitals:  Vitals Value Taken Time  BP 152/77 09/30/2017  8:02 AM  Temp    Pulse 83 09/30/2017  8:04 AM  Resp 20 09/30/2017  8:04 AM  SpO2 100 % 09/30/2017  8:04 AM  Vitals shown include unvalidated device data.  Last Pain:  Vitals:   09/30/17 0606  TempSrc:   PainSc: 0-No pain      Patients Stated Pain Goal: 4 (57/84/69 6295)  Complications: No apparent anesthesia complications

## 2017-09-30 NOTE — Anesthesia Preprocedure Evaluation (Addendum)
Anesthesia Evaluation  Patient identified by MRN, date of birth, ID band Patient awake    Reviewed: Allergy & Precautions, NPO status , Patient's Chart, lab work & pertinent test results, reviewed documented beta blocker date and time   Airway Mallampati: II  TM Distance: >3 FB Neck ROM: Full    Dental  (+) Edentulous Upper, Edentulous Lower   Pulmonary neg pulmonary ROS,    Pulmonary exam normal breath sounds clear to auscultation       Cardiovascular hypertension, Pt. on medications and Pt. on home beta blockers + Valvular Problems/Murmurs AS and AI  Rhythm:Regular Rate:Normal + Systolic murmurs [facility], last visit a few months ago Patient denies CP or SOB ECG: ST ECHO: LV EF: 55% - 60%Possibly bicuspid aortic valve that is severely thickened an calcified with severely restricted leaflet opening. Moderate aortic stenosis and insufficiency.    Neuro/Psych negative neurological ROS  negative psych ROS   GI/Hepatic Neg liver ROS, hiatal hernia,   Endo/Other  diabetes, Insulin Dependent, Oral Hypoglycemic Agents  Renal/GU negative Renal ROS     Musculoskeletal  (+) Arthritis , Osteoarthritis,    Abdominal   Peds  Hematology HLD   Anesthesia Other Findings Left knee arthrofibrosis  Reproductive/Obstetrics                           Anesthesia Physical Anesthesia Plan  ASA: III  Anesthesia Plan: MAC   Post-op Pain Management:    Induction: Intravenous  PONV Risk Score and Plan: 2 and Ondansetron, Treatment may vary due to age or medical condition and Propofol infusion  Airway Management Planned: Simple Face Mask  Additional Equipment:   Intra-op Plan:   Post-operative Plan:   Informed Consent: I have reviewed the patients History and Physical, chart, labs and discussed the procedure including the risks, benefits and alternatives for the proposed anesthesia with  the patient or authorized representative who has indicated his/her understanding and acceptance.   Dental advisory given  Plan Discussed with: CRNA  Anesthesia Plan Comments:        Anesthesia Quick Evaluation

## 2017-09-30 NOTE — Op Note (Signed)
NAME: [name], Jeremy Nicholson MEDICAL RECORD NM:[id number] ACCOUNT [id number] DATE OF BIRTH:August 05, 1945 FACILITY: [facility] LOCATION: [facility] PHYSICIAN:Verland Sprinkle Gwinda Passe, MD  OPERATIVE REPORT  DATE OF PROCEDURE:  09/30/2017  PREOPERATIVE DIAGNOSIS:  Left knee postop total knee arthroplasty with arthrofibrosis.  POSTOPERATIVE DIAGNOSIS:  Left knee postop total knee arthroplasty with arthrofibrosis.  PROCEDURE:  Left knee manipulation under anesthesia.  SURGEON:   Hart Robinsons, MD  ANESTHESIA:  General.  ESTIMATED BLOOD LOSS:  None.  DRAINS:  None.  COMPLICATIONS:  None.  TOURNIQUET TIME:  None.  DISPOSITION:  To PACU stable.  DESCRIPTION OF PROCEDURE:  The patient and family were counseled in the holding area.  The correct site was identified and marked and signed.  Appropriate correct site was marked.  He was taken to the operating room and placed under general anesthesia.   At this point in time,  examination revealed he lacked 70 degrees of full extension.  The patellar mobility was 50%.  He can flex to 80 degrees.  With the patella mobilization and manipulation under anesthesia, gently in extension and flexion, final  range of motion was 5 to 115 degrees.  No complicating features at all.  Ace wrap was applied.  Ice pack and knee immobilizer in full extension.  He was then awakened and taken to the operating room to PACU in stable condition.    No complications or problems.  He will be stabilized in the PACU and discharged to home.  I did discuss with the anesthesiologist, if he is having any significant amount of pain in the PACU, to please administer an adductor canal block and he graciously  agreed to do that.  All questions were encouraged and answered with patient's wife afterwards.  He will start physical therapy tomorrow.  AN/NUANCE  D:09/30/2017 T:09/30/2017 JOB:[id number]

## 2017-09-30 NOTE — H&P (Signed)
Jeremy Nicholson is an 72 y.o. male.   Chief Complaint:my left knee is stiff HPI: status post left TKR with decrease ROM despite PT.  Past Medical History:  Diagnosis Date  . Arthritis    oa  . Complication of anesthesia    spinal done 05-28-17 due to heart murmur  . Diabetes mellitus without complication ([place])    type 2  . Heart murmur   . History of hiatal hernia   . Hypertension     Past Surgical History:  Procedure Laterality Date  . COLONOSCOPY    . JOINT REPLACEMENT Left 05/28/2017  . PROSTATE BIOPSY  07/2017   cancerous polyps x2  watching psa dr Tresa Moore at [facility]  . TOTAL KNEE ARTHROPLASTY Left 05/28/2017   Procedure: LEFT TOTAL KNEE ARTHROPLASTY;  Surgeon: [name], MD;  Location: [facility];  Service: Orthopedics;  Laterality: Left;    History reviewed. No pertinent family history. Social History:  reports that he has never smoked. He has never used smokeless tobacco. He reports that he does not drink alcohol or use drugs.  Allergies:  Allergies  Allergen Reactions  . Latex Hives    Medications Prior to Admission  Medication Sig Dispense Refill  . acetaminophen (TYLENOL) 500 MG tablet Take 500 mg by mouth every 6 (six) hours as needed.    Marland Kitchen amLODipine (NORVASC) 10 MG tablet Take 10 mg by mouth daily.    Marland Kitchen aspirin EC 81 MG tablet Take 81 mg by mouth daily.    Marland Kitchen atorvastatin (LIPITOR) 20 MG tablet Take 1 tablet by mouth daily.  2  . Cholecalciferol 1000 units tablet Take 1,000 Units by mouth daily.    . finasteride (PROSCAR) 5 MG tablet Take 1 tablet by mouth daily.  3  . ibuprofen (ADVIL,MOTRIN) 600 MG tablet Take 600 mg by mouth every 6 (six) hours as needed for headache or moderate pain.    Marland Kitchen insulin glargine (LANTUS) 100 UNIT/ML injection Inject 140 Units into the skin at bedtime.    . metFORMIN (GLUCOPHAGE) 500 MG tablet Take 1,000 mg by mouth 2 (two) times daily with a meal.    . metoprolol tartrate (LOPRESSOR) 50 MG tablet Take 50 mg by mouth daily.     . niacin (SLO-NIACIN) 500 MG tablet Take 500 mg by mouth daily.    . tamsulosin (FLOMAX) 0.4 MG CAPS capsule Take 0.4 mg by mouth at bedtime.    . capsaicin (ZOSTRIX) 0.025 % cream Apply 1 application topically 2 (two) times daily as needed (for knee).    Marland Kitchen doxycycline (VIBRAMYCIN) 100 MG capsule Take 1 capsule (100 mg total) by mouth 2 (two) times daily. 28 capsule 0    Results for orders placed or performed during the hospital encounter of 09/30/17 (from the past 48 hour(s))  Glucose, capillary     Status: Abnormal   Collection Time: 09/30/17  5:42 AM  Result Value Ref Range   Glucose-Capillary 133 (H) 65 - 99 mg/dL   Comment 1 Notify RN   CBC     Status: None   Collection Time: 09/30/17  5:55 AM  Result Value Ref Range   WBC 7.0 4.0 - 10.5 K/uL   RBC 4.80 4.22 - 5.81 MIL/uL   Hemoglobin 14.0 13.0 - 17.0 g/dL   HCT 42.8 39.0 - 52.0 %   MCV 89.2 78.0 - 100.0 fL   MCH 29.2 26.0 - 34.0 pg   MCHC 32.7 30.0 - 36.0 g/dL   RDW 14.2 11.5 - 15.5 %  Platelets 297 150 - 400 K/uL    Comment: Performed at [facility], [place] [address], [place], [place] [address]   No results found.  ROS  Blood pressure (!) 166/78, pulse 70, temperature 97.8 F (36.6 C), temperature source Oral, resp. rate 18, height 5\' 9"  (1.753 m), weight 78.7 kg (173 lb 9.6 oz), SpO2 100 %. Physical Exam left knee ROM 10-80  Awake aklet Oriented times 4. HEENT normal. Heart RRR and NO m,g,r. Abd soft nontender normal BS  Lungs clear to A and P.  Assessment/Plan Left knee arthrofiobrosis.    MUA.  Cynda Familia, MD 09/30/2017, 7:33 AM

## 2017-09-30 NOTE — Anesthesia Postprocedure Evaluation (Signed)
Anesthesia Post Note  Patient: Jeremy Nicholson  Procedure(s) Performed: EXAM UNDER ANESTHESIA WITH MANIPULATION OF KNEE (Left Knee)     Patient location during evaluation: PACU Anesthesia Type: MAC Level of consciousness: awake and alert Pain management: pain level controlled Vital Signs Assessment: post-procedure vital signs reviewed and stable Respiratory status: spontaneous breathing, nonlabored ventilation, respiratory function stable and patient connected to nasal cannula oxygen Cardiovascular status: stable and blood pressure returned to baseline Postop Assessment: no apparent nausea or vomiting Anesthetic complications: no    Last Vitals:  Vitals:   09/30/17 0845 09/30/17 0918  BP: (!) 141/71 (!) 156/73  Pulse: 69 76  Resp: 17 18  Temp: 36.9 C 36.7 C  SpO2: 96% 98%    Last Pain:  Vitals:   09/30/17 0845  TempSrc:   PainSc: 0-No pain                 Ryan P Ellender

## 2017-09-30 NOTE — Op Note (Signed)
Dictated#[id number]

## 2017-10-04 DIAGNOSIS — Z794 Long term (current) use of insulin: Secondary | ICD-10-CM | POA: Diagnosis not present

## 2017-10-04 DIAGNOSIS — I739 Peripheral vascular disease, unspecified: Secondary | ICD-10-CM | POA: Diagnosis not present

## 2017-10-04 DIAGNOSIS — L309 Dermatitis, unspecified: Secondary | ICD-10-CM | POA: Diagnosis not present

## 2017-10-04 DIAGNOSIS — Z8639 Personal history of other endocrine, nutritional and metabolic disease: Secondary | ICD-10-CM | POA: Diagnosis not present

## 2017-10-04 DIAGNOSIS — I10 Essential (primary) hypertension: Secondary | ICD-10-CM | POA: Diagnosis not present

## 2017-10-04 DIAGNOSIS — E559 Vitamin D deficiency, unspecified: Secondary | ICD-10-CM | POA: Diagnosis not present

## 2017-10-04 DIAGNOSIS — E1159 Type 2 diabetes mellitus with other circulatory complications: Secondary | ICD-10-CM | POA: Diagnosis not present

## 2017-10-04 DIAGNOSIS — E1165 Type 2 diabetes mellitus with hyperglycemia: Secondary | ICD-10-CM | POA: Diagnosis not present

## 2017-10-04 DIAGNOSIS — E1121 Type 2 diabetes mellitus with diabetic nephropathy: Secondary | ICD-10-CM | POA: Diagnosis not present

## 2017-10-04 DIAGNOSIS — E785 Hyperlipidemia, unspecified: Secondary | ICD-10-CM | POA: Diagnosis not present

## 2017-10-11 DIAGNOSIS — M25662 Stiffness of left knee, not elsewhere classified: Secondary | ICD-10-CM | POA: Diagnosis not present

## 2017-10-12 DIAGNOSIS — M25662 Stiffness of left knee, not elsewhere classified: Secondary | ICD-10-CM | POA: Diagnosis not present

## 2017-10-13 DIAGNOSIS — M25662 Stiffness of left knee, not elsewhere classified: Secondary | ICD-10-CM | POA: Diagnosis not present

## 2017-10-14 DIAGNOSIS — M25662 Stiffness of left knee, not elsewhere classified: Secondary | ICD-10-CM | POA: Diagnosis not present

## 2017-10-22 DIAGNOSIS — M25662 Stiffness of left knee, not elsewhere classified: Secondary | ICD-10-CM | POA: Diagnosis not present

## 2017-10-29 DIAGNOSIS — M25662 Stiffness of left knee, not elsewhere classified: Secondary | ICD-10-CM | POA: Diagnosis not present

## 2017-11-19 DIAGNOSIS — J069 Acute upper respiratory infection, unspecified: Secondary | ICD-10-CM | POA: Diagnosis not present

## 2017-12-07 DIAGNOSIS — A419 Sepsis, unspecified organism: Secondary | ICD-10-CM | POA: Diagnosis not present

## 2017-12-07 DIAGNOSIS — I214 Non-ST elevation (NSTEMI) myocardial infarction: Secondary | ICD-10-CM | POA: Diagnosis not present

## 2017-12-07 DIAGNOSIS — J9691 Respiratory failure, unspecified with hypoxia: Secondary | ICD-10-CM | POA: Diagnosis not present

## 2017-12-07 DIAGNOSIS — E876 Hypokalemia: Secondary | ICD-10-CM | POA: Diagnosis not present

## 2017-12-07 DIAGNOSIS — J9600 Acute respiratory failure, unspecified whether with hypoxia or hypercapnia: Secondary | ICD-10-CM | POA: Diagnosis not present

## 2017-12-07 DIAGNOSIS — J811 Chronic pulmonary edema: Secondary | ICD-10-CM | POA: Diagnosis not present

## 2017-12-07 DIAGNOSIS — J189 Pneumonia, unspecified organism: Secondary | ICD-10-CM | POA: Diagnosis not present

## 2017-12-07 DIAGNOSIS — R531 Weakness: Secondary | ICD-10-CM | POA: Diagnosis not present

## 2017-12-07 DIAGNOSIS — J810 Acute pulmonary edema: Secondary | ICD-10-CM | POA: Diagnosis not present

## 2017-12-08 DIAGNOSIS — J9601 Acute respiratory failure with hypoxia: Secondary | ICD-10-CM | POA: Diagnosis not present

## 2017-12-08 DIAGNOSIS — R011 Cardiac murmur, unspecified: Secondary | ICD-10-CM | POA: Diagnosis not present

## 2017-12-08 DIAGNOSIS — Z4682 Encounter for fitting and adjustment of non-vascular catheter: Secondary | ICD-10-CM | POA: Diagnosis not present

## 2017-12-08 DIAGNOSIS — Z452 Encounter for adjustment and management of vascular access device: Secondary | ICD-10-CM | POA: Diagnosis not present

## 2017-12-08 DIAGNOSIS — A419 Sepsis, unspecified organism: Secondary | ICD-10-CM | POA: Diagnosis not present

## 2017-12-08 DIAGNOSIS — R570 Cardiogenic shock: Secondary | ICD-10-CM | POA: Diagnosis not present

## 2017-12-08 DIAGNOSIS — J810 Acute pulmonary edema: Secondary | ICD-10-CM | POA: Diagnosis not present

## 2017-12-08 DIAGNOSIS — J9600 Acute respiratory failure, unspecified whether with hypoxia or hypercapnia: Secondary | ICD-10-CM | POA: Diagnosis not present

## 2017-12-08 DIAGNOSIS — N179 Acute kidney failure, unspecified: Secondary | ICD-10-CM | POA: Diagnosis not present

## 2017-12-08 DIAGNOSIS — R7989 Other specified abnormal findings of blood chemistry: Secondary | ICD-10-CM | POA: Diagnosis not present

## 2017-12-08 DIAGNOSIS — R748 Abnormal levels of other serum enzymes: Secondary | ICD-10-CM | POA: Diagnosis not present

## 2017-12-08 DIAGNOSIS — R918 Other nonspecific abnormal finding of lung field: Secondary | ICD-10-CM | POA: Diagnosis not present

## 2017-12-08 DIAGNOSIS — J189 Pneumonia, unspecified organism: Secondary | ICD-10-CM | POA: Diagnosis not present

## 2017-12-08 DIAGNOSIS — I350 Nonrheumatic aortic (valve) stenosis: Secondary | ICD-10-CM | POA: Diagnosis not present

## 2017-12-08 DIAGNOSIS — I214 Non-ST elevation (NSTEMI) myocardial infarction: Secondary | ICD-10-CM | POA: Diagnosis not present

## 2017-12-09 DIAGNOSIS — I214 Non-ST elevation (NSTEMI) myocardial infarction: Secondary | ICD-10-CM | POA: Diagnosis not present

## 2017-12-09 DIAGNOSIS — J9601 Acute respiratory failure with hypoxia: Secondary | ICD-10-CM | POA: Diagnosis not present

## 2017-12-09 DIAGNOSIS — J189 Pneumonia, unspecified organism: Secondary | ICD-10-CM | POA: Diagnosis not present

## 2017-12-09 DIAGNOSIS — A419 Sepsis, unspecified organism: Secondary | ICD-10-CM | POA: Diagnosis not present

## 2017-12-09 DIAGNOSIS — R748 Abnormal levels of other serum enzymes: Secondary | ICD-10-CM | POA: Diagnosis not present

## 2017-12-09 DIAGNOSIS — J9600 Acute respiratory failure, unspecified whether with hypoxia or hypercapnia: Secondary | ICD-10-CM | POA: Diagnosis not present

## 2017-12-09 DIAGNOSIS — R570 Cardiogenic shock: Secondary | ICD-10-CM | POA: Diagnosis not present

## 2017-12-09 DIAGNOSIS — R918 Other nonspecific abnormal finding of lung field: Secondary | ICD-10-CM | POA: Diagnosis not present

## 2017-12-09 DIAGNOSIS — Z452 Encounter for adjustment and management of vascular access device: Secondary | ICD-10-CM | POA: Diagnosis not present

## 2017-12-09 DIAGNOSIS — N179 Acute kidney failure, unspecified: Secondary | ICD-10-CM | POA: Diagnosis not present

## 2017-12-09 DIAGNOSIS — J810 Acute pulmonary edema: Secondary | ICD-10-CM | POA: Diagnosis not present

## 2017-12-09 DIAGNOSIS — I350 Nonrheumatic aortic (valve) stenosis: Secondary | ICD-10-CM | POA: Diagnosis not present

## 2017-12-09 DIAGNOSIS — Z4682 Encounter for fitting and adjustment of non-vascular catheter: Secondary | ICD-10-CM | POA: Diagnosis not present

## 2017-12-10 DIAGNOSIS — E43 Unspecified severe protein-calorie malnutrition: Secondary | ICD-10-CM | POA: Diagnosis not present

## 2017-12-10 DIAGNOSIS — J9601 Acute respiratory failure with hypoxia: Secondary | ICD-10-CM | POA: Diagnosis not present

## 2017-12-10 DIAGNOSIS — J810 Acute pulmonary edema: Secondary | ICD-10-CM | POA: Diagnosis not present

## 2017-12-10 DIAGNOSIS — R570 Cardiogenic shock: Secondary | ICD-10-CM | POA: Diagnosis not present

## 2017-12-10 DIAGNOSIS — J9600 Acute respiratory failure, unspecified whether with hypoxia or hypercapnia: Secondary | ICD-10-CM | POA: Diagnosis not present

## 2017-12-10 DIAGNOSIS — J189 Pneumonia, unspecified organism: Secondary | ICD-10-CM | POA: Diagnosis not present

## 2017-12-10 DIAGNOSIS — N179 Acute kidney failure, unspecified: Secondary | ICD-10-CM | POA: Diagnosis not present

## 2017-12-10 DIAGNOSIS — I214 Non-ST elevation (NSTEMI) myocardial infarction: Secondary | ICD-10-CM | POA: Diagnosis not present

## 2017-12-10 DIAGNOSIS — A419 Sepsis, unspecified organism: Secondary | ICD-10-CM | POA: Diagnosis not present

## 2017-12-10 DIAGNOSIS — I350 Nonrheumatic aortic (valve) stenosis: Secondary | ICD-10-CM | POA: Diagnosis not present

## 2017-12-10 DIAGNOSIS — R748 Abnormal levels of other serum enzymes: Secondary | ICD-10-CM | POA: Diagnosis not present

## 2017-12-11 DIAGNOSIS — J9600 Acute respiratory failure, unspecified whether with hypoxia or hypercapnia: Secondary | ICD-10-CM | POA: Diagnosis not present

## 2017-12-11 DIAGNOSIS — N179 Acute kidney failure, unspecified: Secondary | ICD-10-CM | POA: Diagnosis not present

## 2017-12-11 DIAGNOSIS — A419 Sepsis, unspecified organism: Secondary | ICD-10-CM | POA: Diagnosis not present

## 2017-12-11 DIAGNOSIS — J810 Acute pulmonary edema: Secondary | ICD-10-CM | POA: Diagnosis not present

## 2017-12-11 DIAGNOSIS — N390 Urinary tract infection, site not specified: Secondary | ICD-10-CM | POA: Diagnosis not present

## 2017-12-11 DIAGNOSIS — I214 Non-ST elevation (NSTEMI) myocardial infarction: Secondary | ICD-10-CM | POA: Diagnosis not present

## 2017-12-12 DIAGNOSIS — N179 Acute kidney failure, unspecified: Secondary | ICD-10-CM | POA: Diagnosis not present

## 2017-12-12 DIAGNOSIS — I214 Non-ST elevation (NSTEMI) myocardial infarction: Secondary | ICD-10-CM | POA: Diagnosis not present

## 2017-12-12 DIAGNOSIS — A419 Sepsis, unspecified organism: Secondary | ICD-10-CM | POA: Diagnosis not present

## 2017-12-12 DIAGNOSIS — R570 Cardiogenic shock: Secondary | ICD-10-CM | POA: Diagnosis not present

## 2017-12-12 DIAGNOSIS — R748 Abnormal levels of other serum enzymes: Secondary | ICD-10-CM | POA: Diagnosis not present

## 2017-12-12 DIAGNOSIS — I350 Nonrheumatic aortic (valve) stenosis: Secondary | ICD-10-CM | POA: Diagnosis not present

## 2017-12-12 DIAGNOSIS — N390 Urinary tract infection, site not specified: Secondary | ICD-10-CM | POA: Diagnosis not present

## 2017-12-13 DIAGNOSIS — I214 Non-ST elevation (NSTEMI) myocardial infarction: Secondary | ICD-10-CM | POA: Diagnosis not present

## 2017-12-13 DIAGNOSIS — A419 Sepsis, unspecified organism: Secondary | ICD-10-CM | POA: Diagnosis not present

## 2017-12-13 DIAGNOSIS — N390 Urinary tract infection, site not specified: Secondary | ICD-10-CM | POA: Diagnosis not present

## 2017-12-13 DIAGNOSIS — N179 Acute kidney failure, unspecified: Secondary | ICD-10-CM | POA: Diagnosis not present

## 2017-12-13 DIAGNOSIS — D72829 Elevated white blood cell count, unspecified: Secondary | ICD-10-CM | POA: Diagnosis not present

## 2017-12-13 DIAGNOSIS — R7881 Bacteremia: Secondary | ICD-10-CM | POA: Diagnosis not present

## 2017-12-14 DIAGNOSIS — I214 Non-ST elevation (NSTEMI) myocardial infarction: Secondary | ICD-10-CM | POA: Diagnosis not present

## 2017-12-14 DIAGNOSIS — A419 Sepsis, unspecified organism: Secondary | ICD-10-CM | POA: Diagnosis not present

## 2017-12-14 DIAGNOSIS — N134 Hydroureter: Secondary | ICD-10-CM | POA: Diagnosis not present

## 2017-12-14 DIAGNOSIS — D72829 Elevated white blood cell count, unspecified: Secondary | ICD-10-CM | POA: Diagnosis not present

## 2017-12-14 DIAGNOSIS — R7881 Bacteremia: Secondary | ICD-10-CM | POA: Diagnosis not present

## 2017-12-14 DIAGNOSIS — N390 Urinary tract infection, site not specified: Secondary | ICD-10-CM | POA: Diagnosis not present

## 2017-12-14 DIAGNOSIS — N132 Hydronephrosis with renal and ureteral calculous obstruction: Secondary | ICD-10-CM | POA: Diagnosis not present

## 2017-12-14 DIAGNOSIS — R1084 Generalized abdominal pain: Secondary | ICD-10-CM | POA: Diagnosis not present

## 2017-12-14 DIAGNOSIS — N179 Acute kidney failure, unspecified: Secondary | ICD-10-CM | POA: Diagnosis not present

## 2017-12-15 DIAGNOSIS — A419 Sepsis, unspecified organism: Secondary | ICD-10-CM | POA: Diagnosis not present

## 2017-12-15 DIAGNOSIS — N179 Acute kidney failure, unspecified: Secondary | ICD-10-CM | POA: Diagnosis not present

## 2017-12-15 DIAGNOSIS — Z452 Encounter for adjustment and management of vascular access device: Secondary | ICD-10-CM | POA: Diagnosis not present

## 2017-12-15 DIAGNOSIS — R7881 Bacteremia: Secondary | ICD-10-CM | POA: Diagnosis not present

## 2017-12-15 DIAGNOSIS — I214 Non-ST elevation (NSTEMI) myocardial infarction: Secondary | ICD-10-CM | POA: Diagnosis not present

## 2017-12-15 DIAGNOSIS — N201 Calculus of ureter: Secondary | ICD-10-CM | POA: Diagnosis not present

## 2017-12-15 DIAGNOSIS — N138 Other obstructive and reflux uropathy: Secondary | ICD-10-CM | POA: Diagnosis not present

## 2017-12-15 DIAGNOSIS — J9600 Acute respiratory failure, unspecified whether with hypoxia or hypercapnia: Secondary | ICD-10-CM | POA: Diagnosis not present

## 2017-12-15 DIAGNOSIS — D72829 Elevated white blood cell count, unspecified: Secondary | ICD-10-CM | POA: Diagnosis not present

## 2017-12-15 DIAGNOSIS — N390 Urinary tract infection, site not specified: Secondary | ICD-10-CM | POA: Diagnosis not present

## 2017-12-16 DIAGNOSIS — A419 Sepsis, unspecified organism: Secondary | ICD-10-CM | POA: Diagnosis not present

## 2017-12-16 DIAGNOSIS — R7881 Bacteremia: Secondary | ICD-10-CM | POA: Diagnosis not present

## 2017-12-16 DIAGNOSIS — D72829 Elevated white blood cell count, unspecified: Secondary | ICD-10-CM | POA: Diagnosis not present

## 2017-12-16 DIAGNOSIS — N390 Urinary tract infection, site not specified: Secondary | ICD-10-CM | POA: Diagnosis not present

## 2017-12-16 DIAGNOSIS — I214 Non-ST elevation (NSTEMI) myocardial infarction: Secondary | ICD-10-CM | POA: Diagnosis not present

## 2017-12-16 DIAGNOSIS — N179 Acute kidney failure, unspecified: Secondary | ICD-10-CM | POA: Diagnosis not present

## 2017-12-17 DIAGNOSIS — I509 Heart failure, unspecified: Secondary | ICD-10-CM | POA: Diagnosis not present

## 2017-12-17 DIAGNOSIS — D649 Anemia, unspecified: Secondary | ICD-10-CM | POA: Diagnosis not present

## 2017-12-17 DIAGNOSIS — J189 Pneumonia, unspecified organism: Secondary | ICD-10-CM | POA: Diagnosis not present

## 2017-12-17 DIAGNOSIS — N136 Pyonephrosis: Secondary | ICD-10-CM | POA: Diagnosis not present

## 2017-12-17 DIAGNOSIS — I110 Hypertensive heart disease with heart failure: Secondary | ICD-10-CM | POA: Diagnosis not present

## 2017-12-17 DIAGNOSIS — E119 Type 2 diabetes mellitus without complications: Secondary | ICD-10-CM | POA: Diagnosis not present

## 2017-12-17 DIAGNOSIS — K5730 Diverticulosis of large intestine without perforation or abscess without bleeding: Secondary | ICD-10-CM | POA: Diagnosis not present

## 2017-12-17 DIAGNOSIS — I214 Non-ST elevation (NSTEMI) myocardial infarction: Secondary | ICD-10-CM | POA: Diagnosis not present

## 2017-12-17 DIAGNOSIS — Z794 Long term (current) use of insulin: Secondary | ICD-10-CM | POA: Diagnosis not present

## 2017-12-17 DIAGNOSIS — I350 Nonrheumatic aortic (valve) stenosis: Secondary | ICD-10-CM | POA: Diagnosis not present

## 2017-12-17 DIAGNOSIS — A4159 Other Gram-negative sepsis: Secondary | ICD-10-CM | POA: Diagnosis not present

## 2017-12-17 DIAGNOSIS — Z7982 Long term (current) use of aspirin: Secondary | ICD-10-CM | POA: Diagnosis not present

## 2017-12-17 DIAGNOSIS — E7800 Pure hypercholesterolemia, unspecified: Secondary | ICD-10-CM | POA: Diagnosis not present

## 2017-12-17 DIAGNOSIS — Z96652 Presence of left artificial knee joint: Secondary | ICD-10-CM | POA: Diagnosis not present

## 2017-12-17 DIAGNOSIS — Z452 Encounter for adjustment and management of vascular access device: Secondary | ICD-10-CM | POA: Diagnosis not present

## 2017-12-17 DIAGNOSIS — N400 Enlarged prostate without lower urinary tract symptoms: Secondary | ICD-10-CM | POA: Diagnosis not present

## 2017-12-17 DIAGNOSIS — C61 Malignant neoplasm of prostate: Secondary | ICD-10-CM | POA: Diagnosis not present

## 2017-12-17 DIAGNOSIS — Z792 Long term (current) use of antibiotics: Secondary | ICD-10-CM | POA: Diagnosis not present

## 2017-12-20 DIAGNOSIS — N390 Urinary tract infection, site not specified: Secondary | ICD-10-CM | POA: Diagnosis not present

## 2017-12-20 DIAGNOSIS — R338 Other retention of urine: Secondary | ICD-10-CM | POA: Diagnosis not present

## 2017-12-20 DIAGNOSIS — C61 Malignant neoplasm of prostate: Secondary | ICD-10-CM | POA: Diagnosis not present

## 2017-12-20 DIAGNOSIS — N202 Calculus of kidney with calculus of ureter: Secondary | ICD-10-CM | POA: Diagnosis not present

## 2017-12-20 DIAGNOSIS — A414 Sepsis due to anaerobes: Secondary | ICD-10-CM | POA: Diagnosis not present

## 2017-12-20 DIAGNOSIS — A4150 Gram-negative sepsis, unspecified: Secondary | ICD-10-CM | POA: Diagnosis not present

## 2017-12-20 DIAGNOSIS — N200 Calculus of kidney: Secondary | ICD-10-CM | POA: Diagnosis not present

## 2017-12-20 DIAGNOSIS — E1122 Type 2 diabetes mellitus with diabetic chronic kidney disease: Secondary | ICD-10-CM | POA: Diagnosis not present

## 2017-12-20 DIAGNOSIS — N5201 Erectile dysfunction due to arterial insufficiency: Secondary | ICD-10-CM | POA: Diagnosis not present

## 2017-12-20 DIAGNOSIS — N182 Chronic kidney disease, stage 2 (mild): Secondary | ICD-10-CM | POA: Diagnosis not present

## 2017-12-24 ENCOUNTER — Other Ambulatory Visit: Payer: Self-pay | Admitting: *Deleted

## 2017-12-24 ENCOUNTER — Encounter: Payer: Self-pay | Admitting: *Deleted

## 2017-12-24 NOTE — Patient Outreach (Signed)
[facility]) Care Management  12/24/2017  Jeremy Nicholson 1945/09/28 [id number]  Referral via [place]; member discharged from inpatient admission from [facility].  Telephone call to patient; left HIPPA compliant voice mail requesting call back.  Plan: Geophysicist/field seismologist. Follow up 2-4 business days.  Sherrin Daisy, RN BSN [place] Management Coordinator [facility] Care Management  [phone]

## 2017-12-30 ENCOUNTER — Other Ambulatory Visit: Payer: Self-pay | Admitting: *Deleted

## 2017-12-30 NOTE — Patient Outreach (Signed)
[facility]) Care Management  12/30/2017  Johannes Everage Aug 08, 1945 [id number]  Referral via [place]; member discharged from inpatient admission from [facility].  Telephone call #2 to patient who was advised of reason for call & [facility] care management services. Patient gave HIPPA verification.  Patient voices that everything is going well since discharge from hospital. States he has already seen primary care provider and has  gone to see [place] primary care provider. States he has all of his medications and is taking them as prescribed. States no problem with transportation.  Voices he has support from his family and know when to call MD office for problems and when to call 911 for emergencies.  States he does not need [facility] services at this time.  Agreed to complete TOC assessment but did not have his list of medications available.  Plan: Close case. Send [facility] contact information to patient.  Sherrin Daisy, RN BSN [place] Management Coordinator [facility] Care Management  [phone]

## 2018-01-03 DIAGNOSIS — N202 Calculus of kidney with calculus of ureter: Secondary | ICD-10-CM | POA: Diagnosis not present

## 2018-01-04 DIAGNOSIS — I10 Essential (primary) hypertension: Secondary | ICD-10-CM | POA: Diagnosis not present

## 2018-01-04 DIAGNOSIS — E1122 Type 2 diabetes mellitus with diabetic chronic kidney disease: Secondary | ICD-10-CM | POA: Diagnosis not present

## 2018-01-04 DIAGNOSIS — E785 Hyperlipidemia, unspecified: Secondary | ICD-10-CM | POA: Diagnosis not present

## 2018-01-04 DIAGNOSIS — N182 Chronic kidney disease, stage 2 (mild): Secondary | ICD-10-CM | POA: Diagnosis not present

## 2018-01-14 DIAGNOSIS — N202 Calculus of kidney with calculus of ureter: Secondary | ICD-10-CM | POA: Diagnosis not present

## 2018-01-14 DIAGNOSIS — C61 Malignant neoplasm of prostate: Secondary | ICD-10-CM | POA: Diagnosis not present

## 2018-01-14 DIAGNOSIS — N3000 Acute cystitis without hematuria: Secondary | ICD-10-CM | POA: Diagnosis not present

## 2018-01-14 DIAGNOSIS — R338 Other retention of urine: Secondary | ICD-10-CM | POA: Diagnosis not present

## 2018-01-18 ENCOUNTER — Other Ambulatory Visit: Payer: Self-pay | Admitting: Urology

## 2018-01-27 ENCOUNTER — Encounter (HOSPITAL_BASED_OUTPATIENT_CLINIC_OR_DEPARTMENT_OTHER): Payer: Self-pay | Admitting: *Deleted

## 2018-01-27 ENCOUNTER — Other Ambulatory Visit: Payer: Self-pay

## 2018-01-27 NOTE — Progress Notes (Signed)
Spoke w/ pt via phone for pre-op interview.  Npo after mn w/ exception clear liquids until 0845 (no cream/ milk products).  Needs istat 8.  Current ekg in chart and epic.  Will take norvasc, lopressor, and proscar am dos w/ sips of water.  Pt verbalized understanding to half insulin dose night before surgery.

## 2018-02-01 NOTE — Pre-Procedure Instructions (Signed)
Reviewed ECHO report 12/28/2017 that was received today with Dr. Kalman Shan due to Aortic Stenosis he is not a candidate to have his procedure done at the surgery center.  Left a message to make Selita at Dr. Zettie Pho office aware that he will need to be done at the main hospital.

## 2018-02-02 ENCOUNTER — Encounter (HOSPITAL_COMMUNITY): Payer: Self-pay

## 2018-02-02 NOTE — Patient Instructions (Signed)
Jeremy Nicholson  02/02/2018   Your procedure is scheduled on: 02-04-18  Report to [facility] Main  Entrance  Report to admitting at     [place]  AM    Call this number if you have problems the morning of surgery [phone]   Remember: Do not eat food or drink liquids :After Midnight.     Take these medicines the morning of surgery with A SIP OF WATER: metoprolol, finesteride, amlodipine DO NOT TAKE ANY DIABETIC MEDICATIONS DAY OF YOUR SURGERY                               You may not have any metal on your body including hair pins and              piercings  Do not wear jewelry, , lotions, powders or perfumes, deodorant                     Men may shave face and neck.   Do not bring valuables to the hospital. [place].  Contacts, dentures or bridgework may not be worn into surgery.  Leave suitcase in the car. After surgery it may be brought to your room.     Patients discharged the day of surgery will not be allowed to drive home.  Name and phone number of your driver:  Special Instructions: N/A              Please read over the following fact sheets you were given: _____________________________________________________________________           [facility] - Preparing for Surgery Before surgery, you can play an important role.  Because skin is not sterile, your skin needs to be as free of germs as possible.  You can reduce the number of germs on your skin by washing with CHG (chlorahexidine gluconate) soap before surgery.  CHG is an antiseptic cleaner which kills germs and bonds with the skin to continue killing germs even after washing. Please DO NOT use if you have an allergy to CHG or antibacterial soaps.  If your skin becomes reddened/irritated stop using the CHG and inform your nurse when you arrive at Short Stay. Do not shave (including legs and underarms) for at least 48 hours prior to the first CHG  shower.  You may shave your face/neck. Please follow these instructions carefully:  1.  Shower with CHG Soap the night before surgery and the  morning of Surgery.  2.  If you choose to wash your hair, wash your hair first as usual with your  normal  shampoo.  3.  After you shampoo, rinse your hair and body thoroughly to remove the  shampoo.                           4.  Use CHG as you would any other liquid soap.  You can apply chg directly  to the skin and wash                       Gently with a scrungie or clean washcloth.  5.  Apply the CHG Soap to your body ONLY FROM THE NECK  DOWN.   Do not use on face/ open                           Wound or open sores. Avoid contact with eyes, ears mouth and genitals (private parts).                       Wash face,  Genitals (private parts) with your normal soap.             6.  Wash thoroughly, paying special attention to the area where your surgery  will be performed.  7.  Thoroughly rinse your body with warm water from the neck down.  8.  DO NOT shower/wash with your normal soap after using and rinsing off  the CHG Soap.                9.  Pat yourself dry with a clean towel.            10.  Wear clean pajamas.            11.  Place clean sheets on your bed the night of your first shower and do not  sleep with pets. Day of Surgery : Do not apply any lotions/deodorants the morning of surgery.  Please wear clean clothes to the hospital/surgery center.  FAILURE TO FOLLOW THESE INSTRUCTIONS MAY RESULT IN THE CANCELLATION OF YOUR SURGERY PATIENT SIGNATURE_________________________________  NURSE SIGNATURE__________________________________  ________________________________________________________________________ How to Manage Your Diabetes Before and After Surgery  Why is it important to control my blood sugar before and after surgery? . Improving blood sugar levels before and after surgery helps healing and can limit problems. . A way of improving  blood sugar control is eating a healthy diet by: o  Eating less sugar and carbohydrates o  Increasing activity/exercise o  Talking with your doctor about reaching your blood sugar goals . High blood sugars (greater than 180 mg/dL) can raise your risk of infections and slow your recovery, so you will need to focus on controlling your diabetes during the weeks before surgery. . Make sure that the doctor who takes care of your diabetes knows about your planned surgery including the date and location.  How do I manage my blood sugar before surgery? . Check your blood sugar at least 4 times a day, starting 2 days before surgery, to make sure that the level is not too high or low. o Check your blood sugar the morning of your surgery when you wake up and every 2 hours until you get to the Short Stay unit. . If your blood sugar is less than 70 mg/dL, you will need to treat for low blood sugar: o Do not take insulin. o Treat a low blood sugar (less than 70 mg/dL) with  cup of clear juice (cranberry or apple), 4 glucose tablets, OR glucose gel. o Recheck blood sugar in 15 minutes after treatment (to make sure it is greater than 70 mg/dL). If your blood sugar is not greater than 70 mg/dL on recheck, call [phone] for further instructions. . Report your blood sugar to the short stay nurse when you get to Short Stay.  . If you are admitted to the hospital after surgery: o Your blood sugar will be checked by the staff and you will probably be given insulin after surgery (instead of oral diabetes medicines) to make sure you have good blood sugar levels. o The goal  for blood sugar control after surgery is 80-180 mg/dL.   WHAT DO I DO ABOUT MY DIABETES MEDICATION?  Marland Kitchen Do not take oral diabetes medicines (pills) the morning of surgery.  . THE NIGHT BEFORE SURGERY, take  4  units of      lantus    insulin.       . THE MORNING OF SURGERY, take   units of   0      insulin.  . The day of surgery, do not  take other diabetes injectables, including Byetta (exenatide), Bydureon (exenatide ER), Victoza (liraglutide), or Trulicity (dulaglutide).  Patient Signature:  Date:   Nurse Signature:  Date:   Reviewed and Endorsed by [facility] Patient Education Committee, August 2015

## 2018-02-02 NOTE — Progress Notes (Signed)
ekg 05-30-18 epic  Echo 06-02-17 epic  Echo 12-28-17 chart  Progress note 01-18-18 cards follow up in chart.

## 2018-02-03 ENCOUNTER — Encounter (HOSPITAL_COMMUNITY): Payer: Self-pay

## 2018-02-03 ENCOUNTER — Encounter (HOSPITAL_COMMUNITY)
Admission: RE | Admit: 2018-02-03 | Discharge: 2018-02-03 | Disposition: A | Discharge status: Home / Self Care | Payer: PPO | Source: Ambulatory Visit | Attending: Urology | Admitting: Urology

## 2018-02-03 ENCOUNTER — Other Ambulatory Visit: Payer: Self-pay

## 2018-02-03 DIAGNOSIS — R338 Other retention of urine: Secondary | ICD-10-CM | POA: Diagnosis not present

## 2018-02-03 DIAGNOSIS — Z7982 Long term (current) use of aspirin: Secondary | ICD-10-CM | POA: Diagnosis not present

## 2018-02-03 DIAGNOSIS — I252 Old myocardial infarction: Secondary | ICD-10-CM | POA: Diagnosis not present

## 2018-02-03 DIAGNOSIS — Z87891 Personal history of nicotine dependence: Secondary | ICD-10-CM | POA: Diagnosis not present

## 2018-02-03 DIAGNOSIS — R011 Cardiac murmur, unspecified: Secondary | ICD-10-CM | POA: Diagnosis not present

## 2018-02-03 DIAGNOSIS — I350 Nonrheumatic aortic (valve) stenosis: Secondary | ICD-10-CM | POA: Diagnosis not present

## 2018-02-03 DIAGNOSIS — Z79899 Other long term (current) drug therapy: Secondary | ICD-10-CM | POA: Diagnosis not present

## 2018-02-03 DIAGNOSIS — I10 Essential (primary) hypertension: Secondary | ICD-10-CM | POA: Diagnosis not present

## 2018-02-03 DIAGNOSIS — E119 Type 2 diabetes mellitus without complications: Secondary | ICD-10-CM | POA: Diagnosis not present

## 2018-02-03 DIAGNOSIS — Z794 Long term (current) use of insulin: Secondary | ICD-10-CM | POA: Diagnosis not present

## 2018-02-03 DIAGNOSIS — N132 Hydronephrosis with renal and ureteral calculous obstruction: Secondary | ICD-10-CM | POA: Diagnosis not present

## 2018-02-03 DIAGNOSIS — M1990 Unspecified osteoarthritis, unspecified site: Secondary | ICD-10-CM | POA: Diagnosis not present

## 2018-02-03 DIAGNOSIS — N529 Male erectile dysfunction, unspecified: Secondary | ICD-10-CM | POA: Diagnosis not present

## 2018-02-03 DIAGNOSIS — N401 Enlarged prostate with lower urinary tract symptoms: Secondary | ICD-10-CM | POA: Diagnosis not present

## 2018-02-03 DIAGNOSIS — Z91040 Latex allergy status: Secondary | ICD-10-CM | POA: Diagnosis not present

## 2018-02-03 HISTORY — DX: Acute myocardial infarction, unspecified: I21.9

## 2018-02-03 LAB — CBC
HCT: 39.1 % (ref 39.0–52.0)
HEMOGLOBIN: 12.9 g/dL — AB (ref 13.0–17.0)
MCH: 29.1 pg (ref 26.0–34.0)
MCHC: 33 g/dL (ref 30.0–36.0)
MCV: 88.3 fL (ref 78.0–100.0)
PLATELETS: 254 10*3/uL (ref 150–400)
RBC: 4.43 MIL/uL (ref 4.22–5.81)
RDW: 13.6 % (ref 11.5–15.5)
WBC: 4.8 10*3/uL (ref 4.0–10.5)

## 2018-02-03 LAB — COMPREHENSIVE METABOLIC PANEL
ALK PHOS: 78 U/L (ref 38–126)
ALT: 19 U/L (ref 0–44)
AST: 16 U/L (ref 15–41)
Albumin: 3.6 g/dL (ref 3.5–5.0)
Anion gap: 11 (ref 5–15)
BILIRUBIN TOTAL: 1 mg/dL (ref 0.3–1.2)
BUN: 24 mg/dL — ABNORMAL HIGH (ref 8–23)
CALCIUM: 9.4 mg/dL (ref 8.9–10.3)
CO2: 26 mmol/L (ref 22–32)
CREATININE: 1.29 mg/dL — AB (ref 0.61–1.24)
Chloride: 101 mmol/L (ref 98–111)
GFR calc non Af Amer: 55 mL/min — ABNORMAL LOW (ref >60)
GLUCOSE: 360 mg/dL — AB (ref 70–99)
Potassium: 5 mmol/L (ref 3.5–5.1)
SODIUM: 138 mmol/L (ref 135–145)
TOTAL PROTEIN: 7.5 g/dL (ref 6.5–8.1)

## 2018-02-03 LAB — NO BLOOD PRODUCTS

## 2018-02-03 MED ORDER — GENTAMICIN SULFATE 40 MG/ML IJ SOLN
5.0000 mg/kg | INTRAVENOUS | Status: AC
  Administered 2018-02-04: 370 mg via INTRAVENOUS
  Filled 2018-02-03: qty 9.25

## 2018-02-03 NOTE — Progress Notes (Signed)
cmp done 02-03-18 routed to Dr. Tresa Cambrie Sonnenfeld via epic

## 2018-02-04 ENCOUNTER — Encounter (HOSPITAL_COMMUNITY): Payer: Self-pay | Admitting: Emergency Medicine

## 2018-02-04 ENCOUNTER — Ambulatory Visit (HOSPITAL_COMMUNITY)
Admission: RE | Admit: 2018-02-04 | Discharge: 2018-02-04 | Disposition: A | Discharge status: Home / Self Care | Payer: PPO | Source: Ambulatory Visit | Attending: Urology | Admitting: Urology

## 2018-02-04 ENCOUNTER — Ambulatory Visit (HOSPITAL_COMMUNITY): Payer: PPO | Admitting: Anesthesiology

## 2018-02-04 ENCOUNTER — Encounter (HOSPITAL_COMMUNITY)
Admission: RE | Disposition: A | Discharge status: Home / Self Care | Payer: Self-pay | Source: Ambulatory Visit | Attending: Urology

## 2018-02-04 ENCOUNTER — Ambulatory Visit (HOSPITAL_COMMUNITY): Payer: PPO

## 2018-02-04 DIAGNOSIS — I350 Nonrheumatic aortic (valve) stenosis: Secondary | ICD-10-CM | POA: Diagnosis not present

## 2018-02-04 DIAGNOSIS — N529 Male erectile dysfunction, unspecified: Secondary | ICD-10-CM | POA: Diagnosis not present

## 2018-02-04 DIAGNOSIS — N132 Hydronephrosis with renal and ureteral calculous obstruction: Secondary | ICD-10-CM | POA: Insufficient documentation

## 2018-02-04 DIAGNOSIS — Z87891 Personal history of nicotine dependence: Secondary | ICD-10-CM | POA: Insufficient documentation

## 2018-02-04 DIAGNOSIS — Z794 Long term (current) use of insulin: Secondary | ICD-10-CM | POA: Insufficient documentation

## 2018-02-04 DIAGNOSIS — N201 Calculus of ureter: Secondary | ICD-10-CM | POA: Diagnosis not present

## 2018-02-04 DIAGNOSIS — R338 Other retention of urine: Secondary | ICD-10-CM | POA: Diagnosis not present

## 2018-02-04 DIAGNOSIS — N401 Enlarged prostate with lower urinary tract symptoms: Secondary | ICD-10-CM | POA: Insufficient documentation

## 2018-02-04 DIAGNOSIS — Z79899 Other long term (current) drug therapy: Secondary | ICD-10-CM | POA: Insufficient documentation

## 2018-02-04 DIAGNOSIS — E119 Type 2 diabetes mellitus without complications: Secondary | ICD-10-CM | POA: Insufficient documentation

## 2018-02-04 DIAGNOSIS — R011 Cardiac murmur, unspecified: Secondary | ICD-10-CM | POA: Diagnosis not present

## 2018-02-04 DIAGNOSIS — Z7982 Long term (current) use of aspirin: Secondary | ICD-10-CM | POA: Insufficient documentation

## 2018-02-04 DIAGNOSIS — M1990 Unspecified osteoarthritis, unspecified site: Secondary | ICD-10-CM | POA: Diagnosis not present

## 2018-02-04 DIAGNOSIS — Z91040 Latex allergy status: Secondary | ICD-10-CM | POA: Diagnosis not present

## 2018-02-04 DIAGNOSIS — I252 Old myocardial infarction: Secondary | ICD-10-CM | POA: Diagnosis not present

## 2018-02-04 DIAGNOSIS — I10 Essential (primary) hypertension: Secondary | ICD-10-CM | POA: Diagnosis not present

## 2018-02-04 DIAGNOSIS — N202 Calculus of kidney with calculus of ureter: Secondary | ICD-10-CM | POA: Diagnosis not present

## 2018-02-04 HISTORY — PX: CYSTOSCOPY WITH RETROGRADE PYELOGRAM, URETEROSCOPY AND STENT PLACEMENT: SHX5789

## 2018-02-04 HISTORY — DX: Nonrheumatic aortic (valve) stenosis: I35.0

## 2018-02-04 HISTORY — DX: Diaphragmatic hernia without obstruction or gangrene: K44.9

## 2018-02-04 HISTORY — DX: Systemic inflammatory response syndrome (sirs) of non-infectious origin without acute organ dysfunction: R65.10

## 2018-02-04 HISTORY — DX: Cardiac murmur, unspecified: R01.1

## 2018-02-04 HISTORY — DX: Nocturia: R35.1

## 2018-02-04 HISTORY — DX: Calculus of ureter: N20.1

## 2018-02-04 HISTORY — DX: Unspecified glaucoma: H40.9

## 2018-02-04 HISTORY — DX: Presence of dental prosthetic device (complete) (partial): Z97.2

## 2018-02-04 HISTORY — DX: Complete loss of teeth, unspecified cause, unspecified class: K08.109

## 2018-02-04 HISTORY — DX: Type 2 diabetes mellitus without complications: E11.9

## 2018-02-04 HISTORY — DX: Benign prostatic hyperplasia with lower urinary tract symptoms: N40.1

## 2018-02-04 HISTORY — DX: Presence of spectacles and contact lenses: Z97.3

## 2018-02-04 LAB — GLUCOSE, CAPILLARY
GLUCOSE-CAPILLARY: 227 mg/dL — AB (ref 70–99)
GLUCOSE-CAPILLARY: 241 mg/dL — AB (ref 70–99)

## 2018-02-04 SURGERY — CYSTOURETEROSCOPY, WITH RETROGRADE PYELOGRAM AND STENT INSERTION
Anesthesia: General | Laterality: Left

## 2018-02-04 MED ORDER — SUGAMMADEX SODIUM 200 MG/2ML IV SOLN
INTRAVENOUS | Status: AC
  Filled 2018-02-04: qty 2

## 2018-02-04 MED ORDER — LIDOCAINE 2% (20 MG/ML) 5 ML SYRINGE
INTRAMUSCULAR | Status: AC
  Filled 2018-02-04: qty 20

## 2018-02-04 MED ORDER — DEXAMETHASONE SODIUM PHOSPHATE 10 MG/ML IJ SOLN
INTRAMUSCULAR | Status: DC | PRN
  Administered 2018-02-04: 10 mg via INTRAVENOUS

## 2018-02-04 MED ORDER — EPHEDRINE 5 MG/ML INJ
INTRAVENOUS | Status: AC
  Filled 2018-02-04: qty 20

## 2018-02-04 MED ORDER — ONDANSETRON HCL 4 MG/2ML IJ SOLN
INTRAMUSCULAR | Status: AC
  Filled 2018-02-04: qty 8

## 2018-02-04 MED ORDER — LACTATED RINGERS IV SOLN
INTRAVENOUS | Status: DC
  Administered 2018-02-04 (×2): via INTRAVENOUS

## 2018-02-04 MED ORDER — SODIUM CHLORIDE 0.9 % IR SOLN
Status: DC | PRN
  Administered 2018-02-04: 6000 mL

## 2018-02-04 MED ORDER — TRAMADOL HCL 50 MG PO TABS: 50.0000 mg | ORAL_TABLET | Freq: Four times a day (QID) | ORAL | 0 refills | Status: DC | PRN

## 2018-02-04 MED ORDER — DEXAMETHASONE SODIUM PHOSPHATE 10 MG/ML IJ SOLN
INTRAMUSCULAR | Status: AC
  Filled 2018-02-04: qty 4

## 2018-02-04 MED ORDER — EPHEDRINE 5 MG/ML INJ
INTRAVENOUS | Status: AC
  Filled 2018-02-04: qty 10

## 2018-02-04 MED ORDER — EPHEDRINE SULFATE 50 MG/ML IJ SOLN
INTRAMUSCULAR | Status: DC | PRN
  Administered 2018-02-04: 5 mg via INTRAVENOUS
  Administered 2018-02-04 (×2): 10 mg via INTRAVENOUS
  Administered 2018-02-04 (×2): 5 mg via INTRAVENOUS
  Administered 2018-02-04: 10 mg via INTRAVENOUS

## 2018-02-04 MED ORDER — PHENYLEPHRINE HCL 10 MG/ML IJ SOLN
INTRAMUSCULAR | Status: DC | PRN
  Administered 2018-02-04 (×7): 80 ug via INTRAVENOUS

## 2018-02-04 MED ORDER — ONDANSETRON HCL 4 MG/2ML IJ SOLN
INTRAMUSCULAR | Status: DC | PRN
  Administered 2018-02-04: 4 mg via INTRAVENOUS

## 2018-02-04 MED ORDER — FENTANYL CITRATE (PF) 100 MCG/2ML IJ SOLN
INTRAMUSCULAR | Status: AC
  Filled 2018-02-04: qty 2

## 2018-02-04 MED ORDER — LIDOCAINE HCL (CARDIAC) PF 100 MG/5ML IV SOSY
PREFILLED_SYRINGE | INTRAVENOUS | Status: DC | PRN
  Administered 2018-02-04: 50 mg via INTRAVENOUS

## 2018-02-04 MED ORDER — IOHEXOL 300 MG/ML  SOLN
INTRAMUSCULAR | Status: DC | PRN
  Administered 2018-02-04: 26 mL

## 2018-02-04 MED ORDER — CEPHALEXIN 500 MG PO CAPS: 500.0000 mg | ORAL_CAPSULE | Freq: Two times a day (BID) | ORAL | 0 refills | Status: DC

## 2018-02-04 MED ORDER — FENTANYL CITRATE (PF) 100 MCG/2ML IJ SOLN: 25.0000 ug | INTRAMUSCULAR | Status: DC | PRN

## 2018-02-04 MED ORDER — FENTANYL CITRATE (PF) 100 MCG/2ML IJ SOLN
INTRAMUSCULAR | Status: DC | PRN
  Administered 2018-02-04 (×3): 50 ug via INTRAVENOUS

## 2018-02-04 MED ORDER — PHENYLEPHRINE 40 MCG/ML (10ML) SYRINGE FOR IV PUSH (FOR BLOOD PRESSURE SUPPORT)
PREFILLED_SYRINGE | INTRAVENOUS | Status: AC
  Filled 2018-02-04: qty 10

## 2018-02-04 MED ORDER — GLYCOPYRROLATE PF 0.2 MG/ML IJ SOSY
PREFILLED_SYRINGE | INTRAMUSCULAR | Status: AC
  Filled 2018-02-04: qty 1

## 2018-02-04 MED ORDER — PHENYLEPHRINE HCL 10 MG/ML IJ SOLN
INTRAMUSCULAR | Status: AC
  Filled 2018-02-04: qty 1

## 2018-02-04 MED ORDER — PROPOFOL 10 MG/ML IV BOLUS
INTRAVENOUS | Status: AC
  Filled 2018-02-04: qty 60

## 2018-02-04 MED ORDER — PROPOFOL 10 MG/ML IV BOLUS
INTRAVENOUS | Status: DC | PRN
  Administered 2018-02-04: 100 mg via INTRAVENOUS

## 2018-02-04 SURGICAL SUPPLY — 24 items
BAG URO CATCHER STRL LF (MISCELLANEOUS) ×2 IMPLANT
BASKET LASER NITINOL 1.9FR (BASKET) ×2 IMPLANT
CATH INTERMIT  6FR 70CM (CATHETERS) ×2 IMPLANT
CLOTH BEACON ORANGE TIMEOUT ST (SAFETY) ×2 IMPLANT
COVER SURGICAL LIGHT HANDLE (MISCELLANEOUS) IMPLANT
EXTRACTOR STONE 1.7FRX115CM (UROLOGICAL SUPPLIES) IMPLANT
FIBER LASER FLEXIVA 1000 (UROLOGICAL SUPPLIES) IMPLANT
FIBER LASER FLEXIVA 365 (UROLOGICAL SUPPLIES) IMPLANT
FIBER LASER FLEXIVA 550 (UROLOGICAL SUPPLIES) IMPLANT
FIBER LASER TRAC TIP (UROLOGICAL SUPPLIES) IMPLANT
GLOVE BIOGEL M STRL SZ7.5 (GLOVE) ×2 IMPLANT
GOWN STRL REUS W/TWL LRG LVL3 (GOWN DISPOSABLE) ×4 IMPLANT
GUIDEWIRE ANG ZIPWIRE 038X150 (WIRE) ×2 IMPLANT
GUIDEWIRE STR DUAL SENSOR (WIRE) ×2 IMPLANT
IV NS 1000ML (IV SOLUTION)
IV NS 1000ML BAXH (IV SOLUTION) IMPLANT
MANIFOLD NEPTUNE II (INSTRUMENTS) ×2 IMPLANT
PACK CYSTO (CUSTOM PROCEDURE TRAY) ×2 IMPLANT
SHEATH URETERAL 12FRX28CM (UROLOGICAL SUPPLIES) IMPLANT
SHEATH URETERAL 12FRX35CM (MISCELLANEOUS) ×2 IMPLANT
STENT POLARIS 5FRX26 (STENTS) ×2 IMPLANT
SYR CONTROL 10ML LL (SYRINGE) IMPLANT
TUBE FEEDING 8FR 16IN STR KANG (MISCELLANEOUS) ×2 IMPLANT
TUBING CONNECTING 10 (TUBING) ×2 IMPLANT

## 2018-02-04 NOTE — Transfer of Care (Signed)
Immediate Anesthesia Transfer of Care Note  Patient: Jeremy Nicholson  Procedure(s) Performed: Procedure(s): CYSTOSCOPY WITH RETROGRADE PYELOGRAM, URETEROSCOPY AND STENT [place] (Left)  Patient Location: PACU  Anesthesia Type:General  Level of Consciousness:  sedated, patient cooperative and responds to stimulation  Airway & Oxygen Therapy:Patient Spontanous Breathing and Patient connected to face mask oxgen  Post-op Assessment:  Report given to PACU RN and Post -op Vital signs reviewed and stable  Post vital signs:  Reviewed and stable  Last Vitals:  Vitals:   02/04/18 0539  BP: (!) 148/61  Pulse: 64  Resp: 18  Temp: 36.7 C  SpO2: 703%    Complications: No apparent anesthesia complications

## 2018-02-04 NOTE — OR Nursing (Signed)
Stone taken by Dr. Manny 

## 2018-02-04 NOTE — Discharge Instructions (Addendum)
1 - You may have urinary urgency (bladder spasms) and bloody urine on / off with stent in place. This is normal. ° °2 - Remove tethered stent on Monday morning at home by pulling on string, then blue-white plastic tubing, and discarding. Office is open Monday if any problems arise.  ° °3 - Call MD or go to ER for fever >102, severe pain / nausea / vomiting not relieved by medications, or acute change in medical status ° °

## 2018-02-04 NOTE — Anesthesia Preprocedure Evaluation (Addendum)
Anesthesia Evaluation  Patient identified by MRN, date of birth, ID band Patient awake    Reviewed: Allergy & Precautions, NPO status , Patient's Chart, lab work & pertinent test results, reviewed documented beta blocker date and time   Airway Mallampati: I  TM Distance: >3 FB Neck ROM: Full    Dental no notable dental hx. (+) Edentulous Upper, Edentulous Lower   Pulmonary former smoker,    Pulmonary exam normal breath sounds clear to auscultation       Cardiovascular hypertension, Pt. on medications and Pt. on home beta blockers + Past MI  Normal cardiovascular exam+ Valvular Problems/Murmurs AS and AI  Rhythm:Regular Rate:Normal + Systolic murmurs TTE 09/7260 EF 55-60% Possibly bicuspid aortic valve that is severely thickened an calcified with severely restricted leaflet opening. Moderate aortic stenosis and insufficiency.  MI 11/2017 no interventions  Per pt, Stress in 2018 was negative   Neuro/Psych negative neurological ROS  negative psych ROS   GI/Hepatic negative GI ROS, Neg liver ROS,   Endo/Other  negative endocrine ROSdiabetes, Poorly Controlled, Type 2, Insulin Dependent  Renal/GU negative Renal ROS  negative genitourinary   Musculoskeletal  (+) Arthritis ,   Abdominal   Peds  Hematology negative hematology ROS (+)   Anesthesia Other Findings   Reproductive/Obstetrics                           Anesthesia Physical Anesthesia Plan  ASA: III  Anesthesia Plan: General   Post-op Pain Management:    Induction: Intravenous  PONV Risk Score and Plan: 2 and Dexamethasone and Ondansetron  Airway Management Planned: LMA  Additional Equipment:   Intra-op Plan:   Post-operative Plan: Extubation in OR  Informed Consent: I have reviewed the patients History and Physical, chart, labs and discussed the procedure including the risks, benefits and alternatives for the proposed  anesthesia with the patient or authorized representative who has indicated his/her understanding and acceptance.   Dental advisory given  Plan Discussed with: CRNA  Anesthesia Plan Comments:         Anesthesia Quick Evaluation

## 2018-02-04 NOTE — Progress Notes (Signed)
Dr. Lanetta Inch notified of pts recent heart attack in July.   Dr. Lanetta Inch gave verbal order to cancel I-stat 8 order for this morning due to having labs yesterday.

## 2018-02-04 NOTE — Op Note (Signed)
NAME: Jeremy Nicholson, Jeremy Nicholson MEDICAL RECORD UQ:[id number] ACCOUNT [id number] DATE OF BIRTH:[birth year] FACILITY: [facility] LOCATION: [facility] PHYSICIAN:Dontez Hauss Tresa Moore, MD  OPERATIVE REPORT  DATE OF PROCEDURE:  02/04/2018  PREOPERATIVE DIAGNOSES:   1.  Left ureteral stone.   2.  History of urosepsis.  PROCEDURE: 1.  Cystoscopy, left retrograde pyelogram, interpretation. 2.  Left ureteroscopy, basketing of stones. 3.  Exchange left ureteral stent, 5 x 26 Polaris with tether.  ESTIMATED BLOOD LOSS:  Nil.  COMPLICATIONS:  None.  SPECIMEN:  Left mid ureteral stone for composition analysis.  FINDINGS: 1.  Left mid ureteral stone. 2.  Complete resolution of all stone within the left kidney and ureter, larger than one third millimeter following basket extraction. 3.  Successful replacement of left ureteral stent proximal and renal pelvis, distally in the bladder.  INDICATIONS:  The patient is a 72 year old old gentleman who unfortunately had a left ureteral stone complicated by urosepsis while he was on vacation in [place] several months ago.  He was temporized with stenting at that time.  He has since  cleared infectious parameters.  He now presents for definitive stone management with ureteroscopy with goal of stone free.  Informed consent was obtained and placed in medical record.  DESCRIPTION OF PROCEDURE:  The patient being Jeremy Nicholson, procedure being left ureteroscopic stone manipulation was confirmed.  Procedure timeout was performed.  Antibiotics administered.  General anesthesia induced.  The patient was placed into a low  lithotomy position and sterile field was created by prepping and draping the patient's penis, perineum and proximal thighs using iodine.  Cystourethroscopy was performed using a 22-French rigid cystoscope vessel lens.  Inspection of the anterior and  posterior urethra were unremarkable.  Inspection of the bladder revealed no diverticula, calcifications or  papillary lesions.  The distal end of left ureteral stent was seen in situ.  It was grasped and brought to the level of the urethral meatus.  There  was a 0.038 ZIPwire was advanced to the level of the upper pole and exchanged for an open-ended catheter and left retrograde pyelogram was obtained.  Left ventriculogram reveals a single left ureter, single system left kidney with mild hydronephrosis, no obvious filling defects noted at this point.  The ZIPwire was once again advanced and set aside as a safety wire.  An 8-French feeding tube was  placed in the urinary bladder for pressure release, and semirigid ureteroscopy was performed of the distal 4/5 left ureter alongside a separate sensor working wire.  A stone was encountered in the upper half of the ureter.  It was grasped in an escape  basket and was able to be successfully removed in its entirety and set aside for composition analysis as the goal today was to verify stone free.  A sensor wire was once again advanced over which a 12/14, 35 cm ureteral access sheath was placed at the  level of the proximal ureter.  Using continuous fluoroscopic guidance, a flexible digital ureteroscopy was performed the proximal left ureter and systematic inspection of the left kidney, including all calices x3.  No additional intraluminal stones were  seen whatsoever.  The access sheath was removed under continuous vision, no mucosal abnormalities were found, and finally a new 5 x 26 Polaris-type stent was placed with remaining safety wire using fluoroscopic guidance.  Good proximal and distal planes  were noted.  Tether was left in place and fashioned to the dorsum of the penis.  The procedure terminated.    The patient tolerated the  procedure well.  No immediate complications.  The patient was taken to [facility] in stable condition.  AN/NUANCE  D:02/04/2018 T:02/04/2018 JOB:[id number]

## 2018-02-04 NOTE — Brief Op Note (Signed)
02/04/2018  8:26 AM  PATIENT:  Jeremy Nicholson  72 y.o. male  PRE-OPERATIVE DIAGNOSIS:  LEFT URETERAL STONE  POST-OPERATIVE DIAGNOSIS:  LEFT URETERAL STONE  PROCEDURE:  Procedure(s): CYSTOSCOPY WITH RETROGRADE PYELOGRAM, URETEROSCOPY AND STENT EXCHANGE,BASKETING OF STONE (Left)  SURGEON:  Surgeon(s) and Role:    * Alexis Frock, MD - Primary  PHYSICIAN ASSISTANT:   ASSISTANTS: none   ANESTHESIA:   general  EBL:  minimal   BLOOD ADMINISTERED:none  DRAINS: none   LOCAL MEDICATIONS USED:  NONE  SPECIMEN:  Source of Specimen:  left ureteral stone  DISPOSITION OF SPECIMEN:  [facility] for compositional analysis  COUNTS:  YES  TOURNIQUET:  * No tourniquets in log *  DICTATION: .Other Dictation: Dictation Number [id number]  PLAN OF CARE: Discharge to home after PACU  PATIENT DISPOSITION:  PACU - hemodynamically stable.   Delay start of Pharmacological VTE agent (>24hrs) due to surgical blood loss or risk of bleeding: yes

## 2018-02-04 NOTE — Anesthesia Procedure Notes (Signed)
Procedure Name: LMA Insertion Date/Time: 02/04/2018 7:30 AM Performed by: Lavina Hamman, CRNA Pre-anesthesia Checklist: Patient identified, Emergency Drugs available, Suction available, Patient being monitored and Timeout performed Patient Re-evaluated:Patient Re-evaluated prior to induction Oxygen Delivery Method: Circle system utilized Preoxygenation: Pre-oxygenation with 100% oxygen Induction Type: IV induction Ventilation: Mask ventilation without difficulty LMA: LMA inserted LMA Size: 4.0 Tube size: 4.0 mm Number of attempts: 1 Placement Confirmation: positive ETCO2 and breath sounds checked- equal and bilateral Tube secured with: Tape Dental Injury: Teeth and Oropharynx as per pre-operative assessment

## 2018-02-04 NOTE — H&P (Signed)
Jeremy Nicholson is an 72 y.o. male.    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 07/2017 - PSA 5.39 ===> TRUS BX 71mL, no median lobe. He opts for surveillance.   12/2017 - PSA 5.03 (after recent GU surgery),   2 - Erectile Dysfunction - Uses Viagra PRN through [facility] for trouble maintaining erection with satisfaction.   3 - Enlarged Prostate with Urinary Retention - on tamsulosin at baseline through [place] for obstructive symptoms with good control. DRE 70gm. Devleoped frank retention 08/2017 during episode of klebsiella cystitis (sens bactrim). Recent PVR's 2019 "62mL" (normal). Passed trial of void and placed on finasterde + tamsulosin 08/2017.   4- Left Ureteral STone / Urosepsis - s/p left antegrade stent and ICU admission in [facility] 11/2017 for "staph sepsis from urine" per report and placed on home IV PICC ABX. FU UCX 11/2017 negative. CT 12/2017 confirms 52mm left distal fusiform stone just below iliacs with JJ stent in good position.   PMH sig for DM2 (A1c 8s), HTN. 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 His PCP is [name] MD with [facility].   Today " Jeremy Nicholson " is seen to proceed with LEFT ureteroscopic stone manipulation with goal of left side stone free. NO interval fevers.    Past Medical History:  Diagnosis Date  . Aortic valve stenosis, moderate    per echo 06-02-2017 (in epic)  ef 55-60%,  possible bicuspid AV,  severeely thickened and calcified with severely restricted leaflet opening,  valve area 1.17cm^2, mean gradient 52mmHg, peak gradient 48mmHg,  moderate AR  . Arthritis    oa  . BPH associated with nocturia   . Complication of anesthesia    due to heart murmur  mod to severe aortic stenosis  . Full dentures   . Glaucoma, both eyes   . Heart murmur, systolic   . Hiatal hernia   . Hypertension   . Left ureteral stone   . Myocardial  infarction ([place])    mild in 11-2017 at [facility] F/U from [place] on chart  . SIRS (systemic inflammatory response syndrome) ([place]) 05/31/2017   hospital admission--  unknown etiology  . Type 2 diabetes mellitus ([place])   . Wears glasses     Past Surgical History:  Procedure Laterality Date  . COLONOSCOPY    . EXAM UNDER ANESTHESIA WITH MANIPULATION OF KNEE Left 09/30/2017   Procedure: EXAM UNDER ANESTHESIA WITH MANIPULATION OF KNEE;  Surgeon: [name], MD;  Location: [facility];  Service: Orthopedics;  Laterality: Left;  30 mins  . PROSTATE BIOPSY  07/2017   cancerous polyps x2  watching psa dr Tresa Moore at [facility]  . TOTAL KNEE ARTHROPLASTY Left 05/28/2017   Procedure: LEFT TOTAL KNEE ARTHROPLASTY;  Surgeon: [name], MD;  Location: [facility];  Service: Orthopedics;  Laterality: Left;  . TRANSTHORACIC ECHOCARDIOGRAM  06/02/2017   ef 55-60%/  possible AV bicuspid,  severely thickened and calcified leaflets with severely restricted leaflet opening, moderate regurg.(valve area 1.17cm^2,  mean gradient 54mmHg, peak gradient 55mmHg)/  mild LAE/  mild TR/  mild MV thickened leaflets and regurg. (not restricted and no stenosis    History reviewed. No pertinent family history. Social History:  reports that he quit smoking about 23 years ago. His smoking use included cigarettes. He quit after 30.00 years of use. He  has never used smokeless tobacco. He reports that he does not drink alcohol or use drugs.  Allergies:  Allergies  Allergen Reactions  . Latex Hives    Medications Prior to Admission  Medication Sig Dispense Refill  . acetaminophen (TYLENOL) 500 MG tablet Take 1,000 mg by mouth every 6 (six) hours as needed for moderate pain.     Marland Kitchen amLODipine (NORVASC) 10 MG tablet Take 10 mg by mouth every morning.     Marland Kitchen atorvastatin (LIPITOR) 40 MG tablet Take 40 tablets by mouth every evening.   2  . Cholecalciferol 1000 units tablet Take 1,000 Units by mouth every morning.     . diclofenac  sodium (VOLTAREN) 1 % GEL Apply 2 g topically daily as needed (knee pain).    . finasteride (PROSCAR) 5 MG tablet Take 5 mg by mouth every morning.   3  . furosemide (LASIX) 40 MG tablet Take 40 mg by mouth every morning.    . insulin glargine (LANTUS) 100 UNIT/ML injection Inject 8 Units into the skin at bedtime.     Marland Kitchen latanoprost (XALATAN) 0.005 % ophthalmic solution Place 1 drop into both eyes at bedtime.     . metoprolol tartrate (LOPRESSOR) 50 MG tablet Take 50 mg by mouth 2 (two) times daily.     . niacin (SLO-NIACIN) 500 MG tablet Take 500 mg by mouth every morning.     . potassium chloride SA (K-DUR,KLOR-CON) 20 MEQ tablet Take 20 mEq by mouth every morning.    . tamsulosin (FLOMAX) 0.4 MG CAPS capsule Take 0.4 mg by mouth at bedtime.     Marland Kitchen aspirin EC 81 MG tablet Take 81 mg by mouth daily.      Results for orders placed or performed during the hospital encounter of 02/04/18 (from the past 48 hour(s))  Glucose, capillary     Status: Abnormal   Collection Time: 02/04/18  5:41 AM  Result Value Ref Range   Glucose-Capillary 227 (H) 70 - 99 mg/dL   No results found.  Review of Systems  Constitutional: Negative.  Negative for chills and fever.  HENT: Negative.   Eyes: Negative.   Respiratory: Negative.   Cardiovascular: Negative.   Gastrointestinal: Negative.   Genitourinary: Positive for hematuria and urgency.  Musculoskeletal: Negative.   Skin: Negative.   Neurological: Negative.   Endo/Heme/Allergies: Negative.   Psychiatric/Behavioral: Negative.     Blood pressure (!) 148/61, pulse 64, temperature 98 F (36.7 C), temperature source Oral, resp. rate 18, height 5\' 9"  (1.753 m), weight 77.1 kg, SpO2 100 %. Physical Exam  Constitutional: He appears well-developed.  HENT:  Head: Normocephalic.  Eyes: Pupils are equal, round, and reactive to light.  Neck: Normal range of motion.  Cardiovascular: Normal rate.  Respiratory: Effort normal.  GI: Soft.  Musculoskeletal:  Normal range of motion.  Neurological: He is alert.  Skin: Skin is warm.     Assessment/Plan  Proceed as planned with LEFT ureteroscopic stone manipulation. Risks, benefits, alternatives, expected peri-op course discussed previously and reiterated today.   Alexis Frock, MD 02/04/2018, 7:13 AM

## 2018-02-04 NOTE — Anesthesia Postprocedure Evaluation (Signed)
Anesthesia Post Note  Patient: Jeremy Nicholson  Procedure(s) Performed: CYSTOSCOPY WITH RETROGRADE PYELOGRAM, URETEROSCOPY AND STENT [place] OF STONE (Left )     Patient location during evaluation: PACU Anesthesia Type: General Level of consciousness: awake and alert Pain management: pain level controlled Vital Signs Assessment: post-procedure vital signs reviewed and stable Respiratory status: spontaneous breathing, nonlabored ventilation, respiratory function stable and patient connected to nasal cannula oxygen Cardiovascular status: blood pressure returned to baseline and stable Postop Assessment: no apparent nausea or vomiting Anesthetic complications: no    Last Vitals:  Vitals:   02/04/18 0915 02/04/18 1000  BP: (!) 127/59 (!) 156/58  Pulse: 70 75  Resp: 15 16  Temp: 36.8 C 36.7 C  SpO2: 94% 100%    Last Pain:  Vitals:   02/04/18 1000  TempSrc:   PainSc: 0-No pain                 Felisha Claytor L Jeannemarie Sawaya

## 2018-02-05 ENCOUNTER — Encounter (HOSPITAL_COMMUNITY): Payer: Self-pay | Admitting: Urology

## 2018-02-17 DIAGNOSIS — R338 Other retention of urine: Secondary | ICD-10-CM | POA: Diagnosis not present

## 2018-02-17 DIAGNOSIS — C61 Malignant neoplasm of prostate: Secondary | ICD-10-CM | POA: Diagnosis not present

## 2018-02-17 DIAGNOSIS — N5201 Erectile dysfunction due to arterial insufficiency: Secondary | ICD-10-CM | POA: Diagnosis not present

## 2018-02-17 DIAGNOSIS — N202 Calculus of kidney with calculus of ureter: Secondary | ICD-10-CM | POA: Diagnosis not present

## 2018-04-07 ENCOUNTER — Other Ambulatory Visit: Payer: Self-pay | Admitting: Family Medicine

## 2018-04-07 DIAGNOSIS — Z0000 Encounter for general adult medical examination without abnormal findings: Secondary | ICD-10-CM | POA: Diagnosis not present

## 2018-04-07 DIAGNOSIS — Z87891 Personal history of nicotine dependence: Secondary | ICD-10-CM | POA: Diagnosis not present

## 2018-04-12 ENCOUNTER — Ambulatory Visit
Admission: RE | Admit: 2018-04-12 | Discharge: 2018-04-12 | Disposition: A | Discharge status: Home / Self Care | Payer: PPO | Source: Ambulatory Visit | Attending: Family Medicine | Admitting: Family Medicine

## 2018-04-12 DIAGNOSIS — Z87891 Personal history of nicotine dependence: Secondary | ICD-10-CM | POA: Diagnosis not present

## 2018-04-12 DIAGNOSIS — Z136 Encounter for screening for cardiovascular disorders: Secondary | ICD-10-CM | POA: Diagnosis not present

## 2018-04-12 DIAGNOSIS — Z0000 Encounter for general adult medical examination without abnormal findings: Secondary | ICD-10-CM

## 2018-06-14 ENCOUNTER — Other Ambulatory Visit: Payer: Self-pay | Admitting: Urology

## 2018-06-14 DIAGNOSIS — N5201 Erectile dysfunction due to arterial insufficiency: Secondary | ICD-10-CM | POA: Diagnosis not present

## 2018-06-14 DIAGNOSIS — R3915 Urgency of urination: Secondary | ICD-10-CM | POA: Diagnosis not present

## 2018-06-14 DIAGNOSIS — N202 Calculus of kidney with calculus of ureter: Secondary | ICD-10-CM | POA: Diagnosis not present

## 2018-06-14 DIAGNOSIS — C61 Malignant neoplasm of prostate: Secondary | ICD-10-CM | POA: Diagnosis not present

## 2018-06-29 ENCOUNTER — Ambulatory Visit
Admission: RE | Admit: 2018-06-29 | Discharge: 2018-06-29 | Disposition: A | Discharge status: Home / Self Care | Payer: PPO | Source: Ambulatory Visit | Attending: Urology | Admitting: Urology

## 2018-06-29 DIAGNOSIS — R972 Elevated prostate specific antigen [PSA]: Secondary | ICD-10-CM | POA: Diagnosis not present

## 2018-06-29 DIAGNOSIS — C61 Malignant neoplasm of prostate: Secondary | ICD-10-CM | POA: Diagnosis not present

## 2018-06-29 MED ORDER — GADOBENATE DIMEGLUMINE 529 MG/ML IV SOLN
16.0000 mL | Freq: Once | INTRAVENOUS | Status: AC | PRN
  Administered 2018-06-29: 16 mL via INTRAVENOUS

## 2018-07-12 DIAGNOSIS — R3915 Urgency of urination: Secondary | ICD-10-CM | POA: Diagnosis not present

## 2018-07-12 DIAGNOSIS — N202 Calculus of kidney with calculus of ureter: Secondary | ICD-10-CM | POA: Diagnosis not present

## 2018-07-12 DIAGNOSIS — N5201 Erectile dysfunction due to arterial insufficiency: Secondary | ICD-10-CM | POA: Diagnosis not present

## 2018-07-12 DIAGNOSIS — C61 Malignant neoplasm of prostate: Secondary | ICD-10-CM | POA: Diagnosis not present

## 2019-01-17 DIAGNOSIS — C61 Malignant neoplasm of prostate: Secondary | ICD-10-CM | POA: Diagnosis not present

## 2019-01-23 DIAGNOSIS — N5201 Erectile dysfunction due to arterial insufficiency: Secondary | ICD-10-CM | POA: Diagnosis not present

## 2019-01-23 DIAGNOSIS — N202 Calculus of kidney with calculus of ureter: Secondary | ICD-10-CM | POA: Diagnosis not present

## 2019-01-23 DIAGNOSIS — C61 Malignant neoplasm of prostate: Secondary | ICD-10-CM | POA: Diagnosis not present

## 2019-01-23 DIAGNOSIS — R338 Other retention of urine: Secondary | ICD-10-CM | POA: Diagnosis not present

## 2019-02-27 DIAGNOSIS — I2510 Atherosclerotic heart disease of native coronary artery without angina pectoris: Secondary | ICD-10-CM | POA: Insufficient documentation

## 2019-03-13 ENCOUNTER — Telehealth (HOSPITAL_COMMUNITY): Payer: Self-pay

## 2019-03-13 MED ORDER — BISACODYL 10 MG RE SUPP
10.00 | RECTAL | Status: DC
Start: ? — End: 2019-03-13

## 2019-03-13 MED ORDER — BRIMONIDINE TARTRATE 0.2 % OP SOLN
1.00 | OPHTHALMIC | Status: DC
Start: 2019-03-11 — End: 2019-03-13

## 2019-03-13 MED ORDER — AMLODIPINE BESYLATE 5 MG PO TABS
5.00 | ORAL_TABLET | ORAL | Status: DC
Start: 2019-03-12 — End: 2019-03-13

## 2019-03-13 MED ORDER — POLYETHYLENE GLYCOL 3350 17 G PO PACK
17.00 | PACK | ORAL | Status: DC
Start: ? — End: 2019-03-13

## 2019-03-13 MED ORDER — SORBITOL 70 % PO SOLN
30.00 | ORAL | Status: DC
Start: ? — End: 2019-03-13

## 2019-03-13 MED ORDER — ONDANSETRON 4 MG PO TBDP
4.00 | ORAL_TABLET | ORAL | Status: DC
Start: ? — End: 2019-03-13

## 2019-03-13 MED ORDER — DEXTROSE 10 % IV SOLN
125.00 | INTRAVENOUS | Status: DC
Start: ? — End: 2019-03-13

## 2019-03-13 MED ORDER — TORSEMIDE 10 MG PO TABS
10.00 | ORAL_TABLET | ORAL | Status: DC
Start: 2019-03-12 — End: 2019-03-13

## 2019-03-13 MED ORDER — METOPROLOL TARTRATE 50 MG PO TABS
50.00 | ORAL_TABLET | ORAL | Status: DC
Start: 2019-03-11 — End: 2019-03-13

## 2019-03-13 MED ORDER — TAMSULOSIN HCL 0.4 MG PO CAPS
0.40 | ORAL_CAPSULE | ORAL | Status: DC
Start: 2019-03-12 — End: 2019-03-13

## 2019-03-13 MED ORDER — HEPARIN SODIUM (PORCINE) 5000 UNIT/ML IJ SOLN
5000.00 | INTRAMUSCULAR | Status: DC
Start: 2019-03-11 — End: 2019-03-13

## 2019-03-13 MED ORDER — GENERIC EXTERNAL MEDICATION
1.00 | Status: DC
Start: 2019-03-11 — End: 2019-03-13

## 2019-03-13 MED ORDER — DIPHENHYDRAMINE HCL 25 MG PO CAPS
25.00 | ORAL_CAPSULE | ORAL | Status: DC
Start: ? — End: 2019-03-13

## 2019-03-13 MED ORDER — OXYCODONE HCL 5 MG PO TABS
5.00 | ORAL_TABLET | ORAL | Status: DC
Start: ? — End: 2019-03-13

## 2019-03-13 MED ORDER — FENOFIBRATE 54 MG PO TABS
54.00 | ORAL_TABLET | ORAL | Status: DC
Start: 2019-03-12 — End: 2019-03-13

## 2019-03-13 MED ORDER — ASPIRIN EC 81 MG PO TBEC
162.00 | DELAYED_RELEASE_TABLET | ORAL | Status: DC
Start: 2019-03-12 — End: 2019-03-13

## 2019-03-13 MED ORDER — INSULIN LISPRO 100 UNIT/ML ~~LOC~~ SOLN
2.00 | SUBCUTANEOUS | Status: DC
Start: 2019-03-11 — End: 2019-03-13

## 2019-03-13 MED ORDER — MELATONIN 3 MG PO TABS
3.00 | ORAL_TABLET | ORAL | Status: DC
Start: ? — End: 2019-03-13

## 2019-03-13 MED ORDER — ACETAMINOPHEN 500 MG PO TABS
1000.00 | ORAL_TABLET | ORAL | Status: DC
Start: 2019-03-11 — End: 2019-03-13

## 2019-03-13 MED ORDER — HYDRALAZINE HCL 20 MG/ML IJ SOLN
10.00 | INTRAMUSCULAR | Status: DC
Start: ? — End: 2019-03-13

## 2019-03-13 MED ORDER — INSULIN GLARGINE 100 UNIT/ML SOLOSTAR PEN
30.00 | PEN_INJECTOR | SUBCUTANEOUS | Status: DC
Start: 2019-03-12 — End: 2019-03-13

## 2019-03-13 MED ORDER — GLUCOSE 40 % PO GEL
15.00 | ORAL | Status: DC
Start: ? — End: 2019-03-13

## 2019-03-13 MED ORDER — SENNOSIDES-DOCUSATE SODIUM 8.6-50 MG PO TABS
2.00 | ORAL_TABLET | ORAL | Status: DC
Start: 2019-03-11 — End: 2019-03-13

## 2019-03-13 NOTE — Telephone Encounter (Signed)
Called and spoke with pt in regards to CR, pt stated he would like to participate in our program. Adv pt he would need to contact the [place] to have them fax over [place]. Pt stated he understood. His f/u appt with Dr. Steva Ready is 04/11/2019.

## 2019-04-10 DIAGNOSIS — I10 Essential (primary) hypertension: Secondary | ICD-10-CM | POA: Diagnosis not present

## 2019-04-10 DIAGNOSIS — Z1389 Encounter for screening for other disorder: Secondary | ICD-10-CM | POA: Diagnosis not present

## 2019-04-10 DIAGNOSIS — Z0000 Encounter for general adult medical examination without abnormal findings: Secondary | ICD-10-CM | POA: Diagnosis not present

## 2019-04-10 DIAGNOSIS — I700 Atherosclerosis of aorta: Secondary | ICD-10-CM | POA: Diagnosis not present

## 2019-04-10 DIAGNOSIS — N400 Enlarged prostate without lower urinary tract symptoms: Secondary | ICD-10-CM | POA: Diagnosis not present

## 2019-04-10 DIAGNOSIS — E785 Hyperlipidemia, unspecified: Secondary | ICD-10-CM | POA: Diagnosis not present

## 2019-04-10 DIAGNOSIS — E1159 Type 2 diabetes mellitus with other circulatory complications: Secondary | ICD-10-CM | POA: Diagnosis not present

## 2019-04-13 IMAGING — CT CT ANGIO CHEST
2 of 6 series · 18 of 46 positions shown · IV contrast (ISOVUE 370)
Comparison: Chest radiograph 05/30/2017

CLINICAL DATA: Fever, weakness, tachycardia, elevated white cell
count after total knee replacement. History of hypertension and
diabetes.

EXAM:
CT ANGIOGRAPHY CHEST WITH CONTRAST
TECHNIQUE: Multidetector CT imaging of the chest was performed using the
standard protocol during bolus administration of intravenous
contrast. Multiplanar CT image reconstructions and MIPs were
obtained to evaluate the vascular anatomy.
CONTRAST:  100 mL Isovue 370

[Series 6: coronal mpr · coronal · 0.54mm/px · 3 of 129 slices shown]
[im 33/129  soft-tissue]
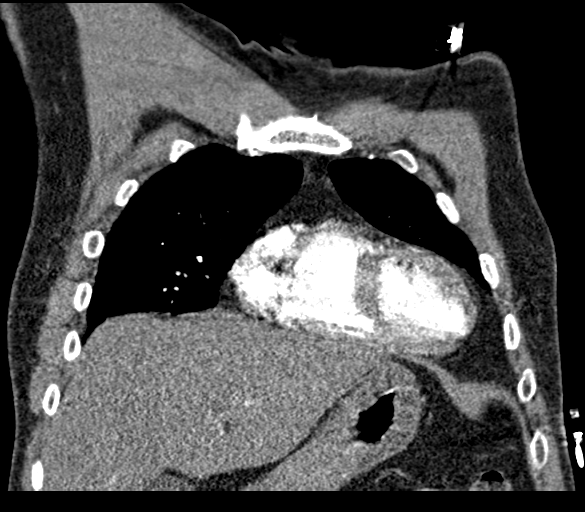
[im 65/129  soft-tissue]
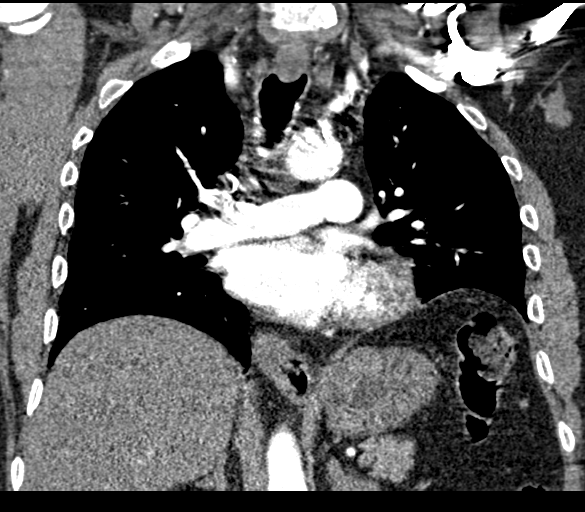
[im 97/129  soft-tissue]
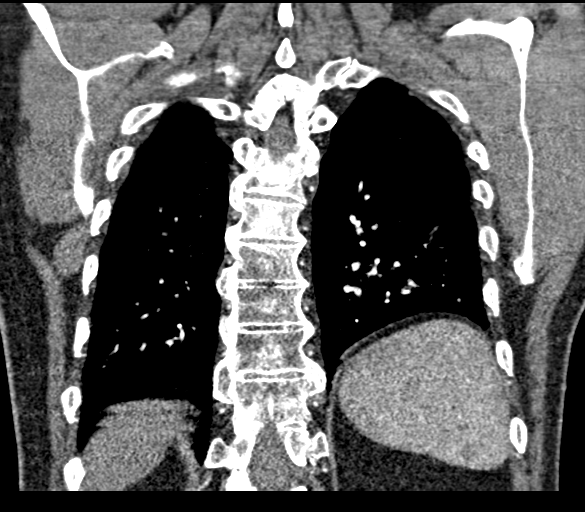

[Series 10: thins · axial · 0.66mm/px · z∈[-392,-154]mm · 15 of 261 slices shown]
[im 12/261  lung]
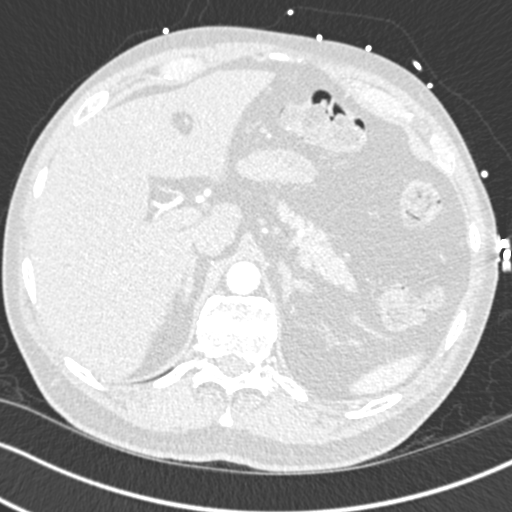
[im 34/261  soft-tissue]
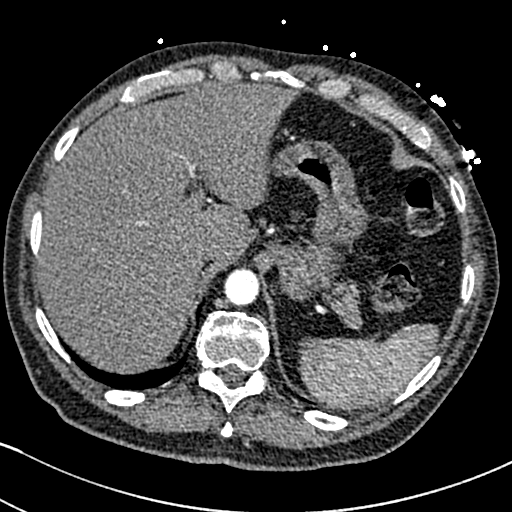
[im 46/261  lung]
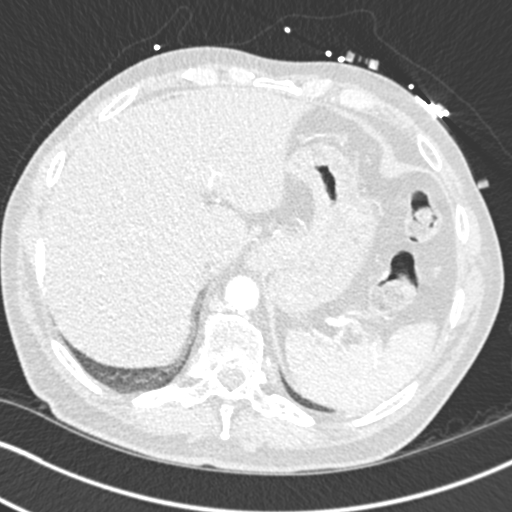
[im 68/261  soft-tissue]
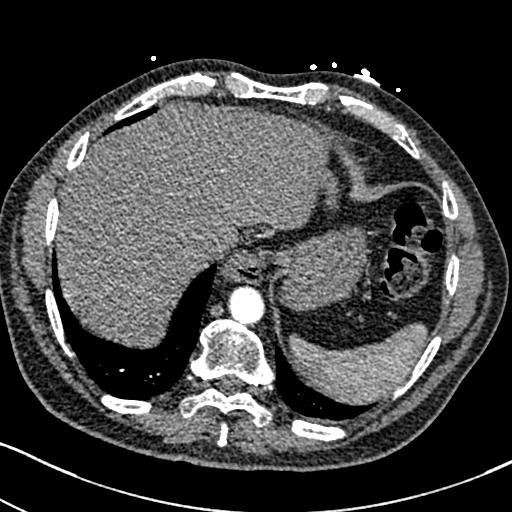
[im 80/261  lung]
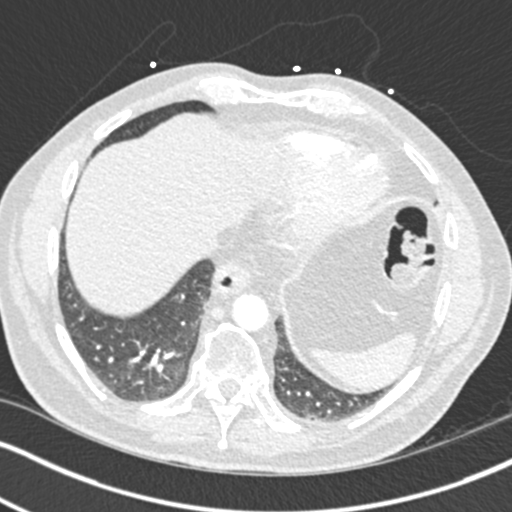
[im 102/261  soft-tissue]
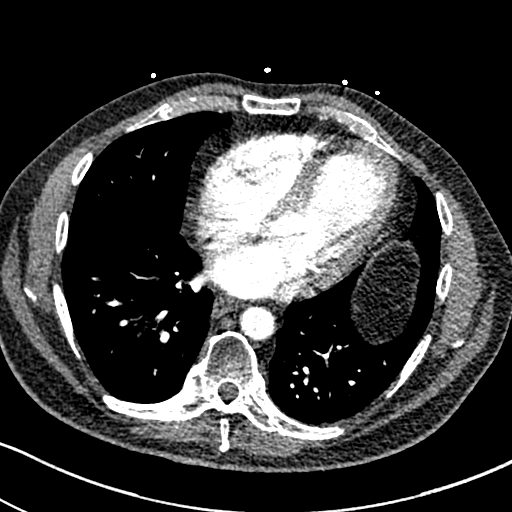
[im 114/261  lung]
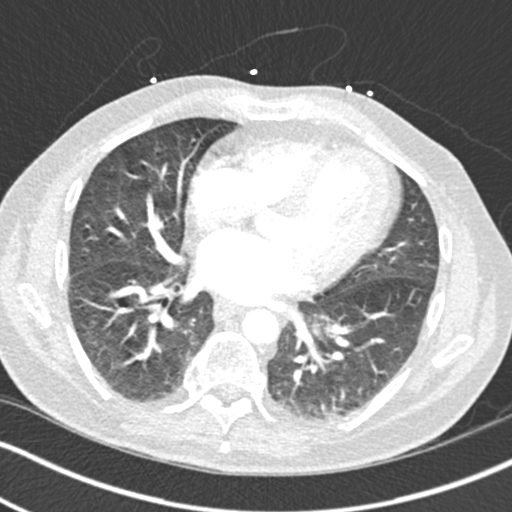
[im 136/261  soft-tissue]
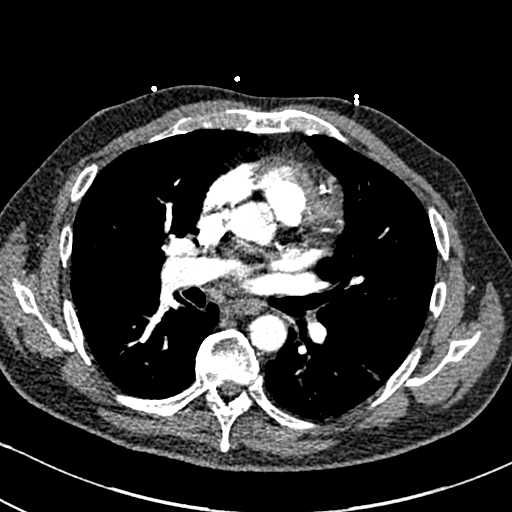
[im 147/261  lung]
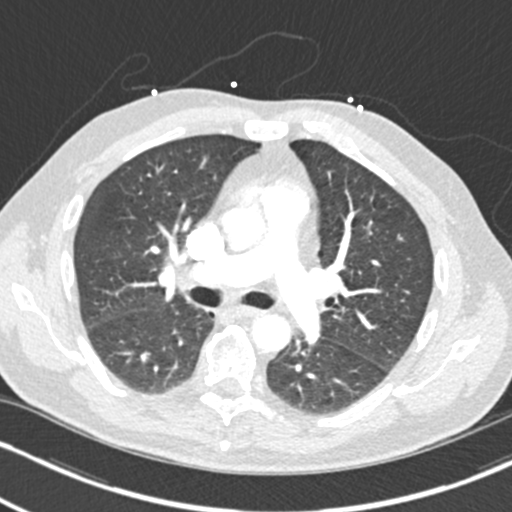
[im 159/261  soft-tissue]
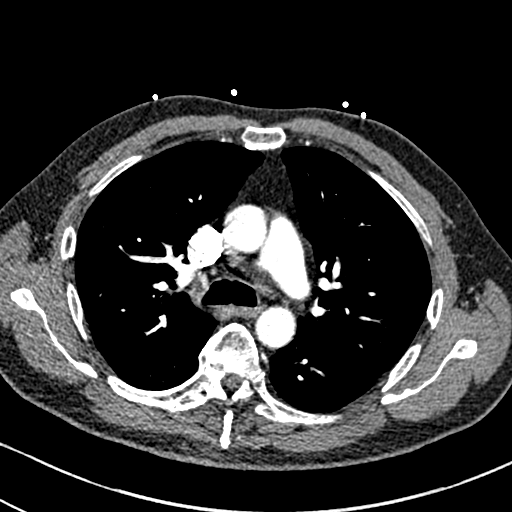
[im 181/261  lung]
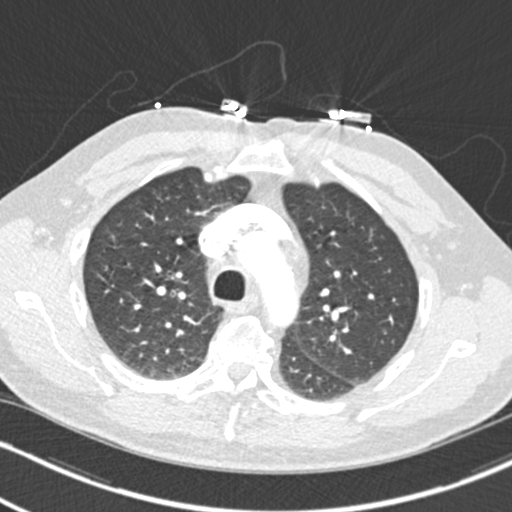
[im 193/261  soft-tissue]
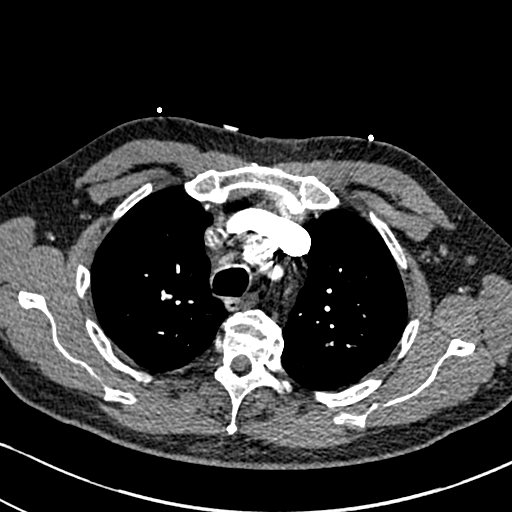
[im 215/261  lung]
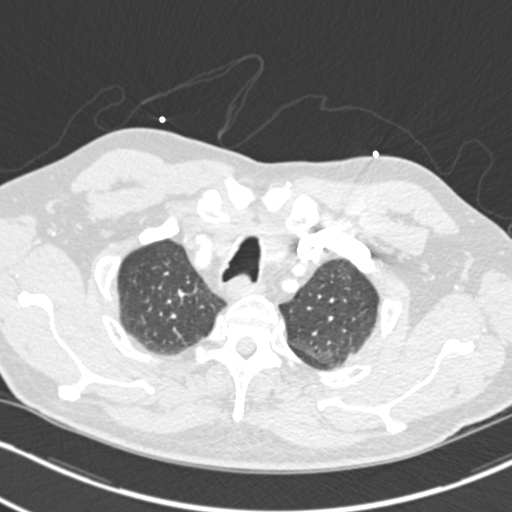
[im 227/261  soft-tissue]
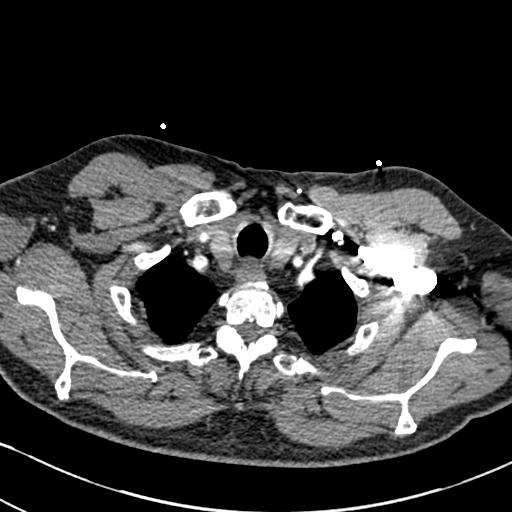
[im 249/261  lung]
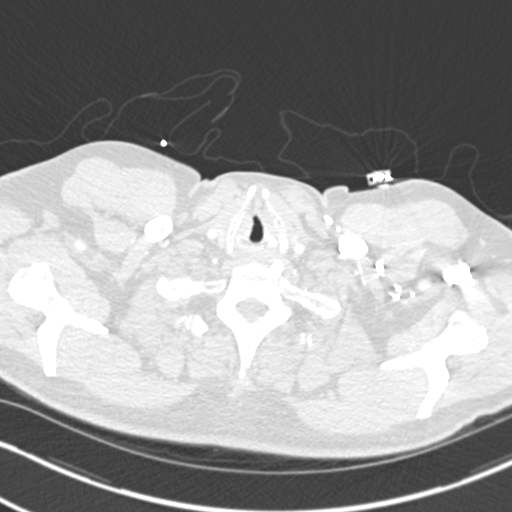

[18 of 46 positions shown; findings below may reference images not displayed]

FINDINGS: Cardiovascular: Good opacification of the central and segmental
pulmonary arteries. No focal filling defects. No evidence of
significant pulmonary embolus. Normal heart size. No pericardial
effusion. Coronary artery calcifications. Normal caliber thoracic
aorta. No dissection. Great vessel origins are patent. Aortic
calcification.

Mediastinum/Nodes: Esophagus is decompressed. No significant
mediastinal lymphadenopathy. There is an enlarged right axillary
lymph node measuring 14 mm short axis dimension. Etiology is
nonspecific. This could represent inflammatory node although
metastatic disease or lymphoma could also have this appearance.

Lungs/Pleura: Atelectasis in the lung bases. No consolidation or
edema. Mild emphysematous changes in the upper lungs. No pleural
effusions. No pneumothorax. Airways are patent.

Upper Abdomen: No acute abnormality.

Musculoskeletal: No chest wall abnormality. No acute or significant
osseous findings. Diffuse degenerative changes of the thoracic spine
with bridging anterior osteophytes.

Review of the MIP images confirms the above findings.
IMPRESSION: 1. No evidence of significant pulmonary embolus.
2. Coronary artery calcifications.
3. Nonspecific 14 mm enlarged lymph node in the right axilla.
Physical examination may be useful in further evaluation.
4. Atelectasis in the lung bases. No evidence of active pulmonary
disease. Mild pulmonary emphysema.

Aortic Atherosclerosis (V0MWA-1YL.L) and Emphysema (V0MWA-GJH.3).

## 2019-04-15 IMAGING — DX DG ABDOMEN 2V
3 series · 3 of 3 positions shown · non-contrast
Comparison: Chest CT 05/31/2017.  Chest radiographs 05/30/2017.

CLINICAL DATA: Abdominal discomfort. No bowel movement in 7 day
since knee surgery.

EXAM:
ABDOMEN - 2 VIEW

[abdomen supine]
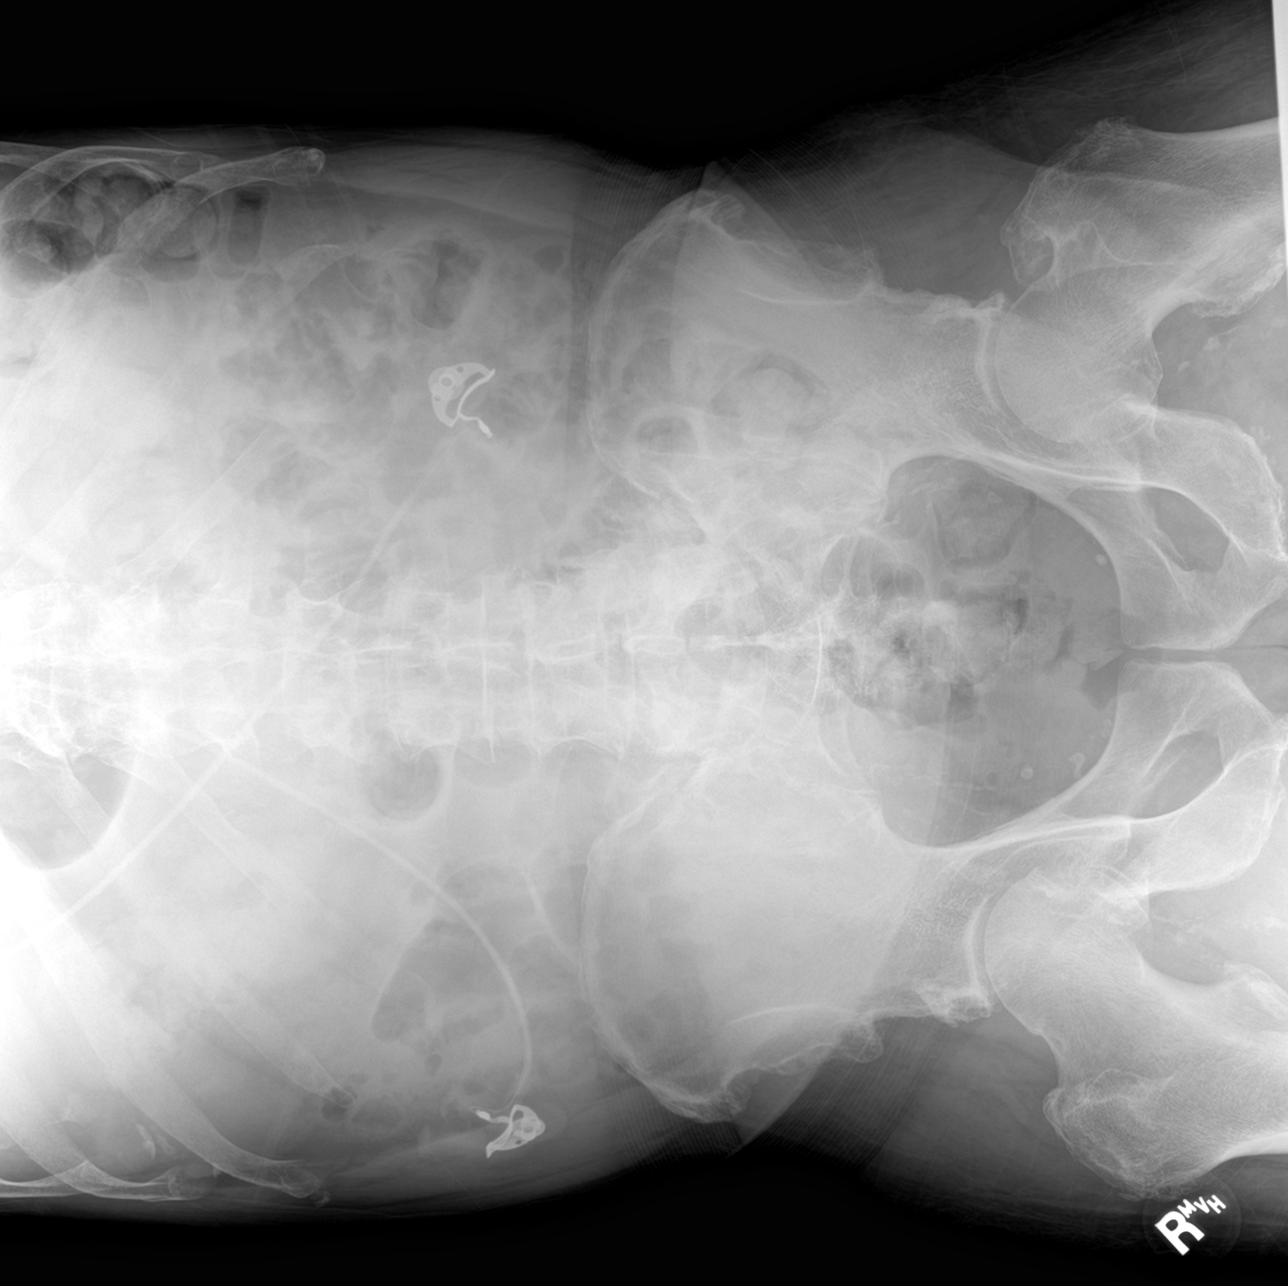

[abdomen decu (1 of 2)]
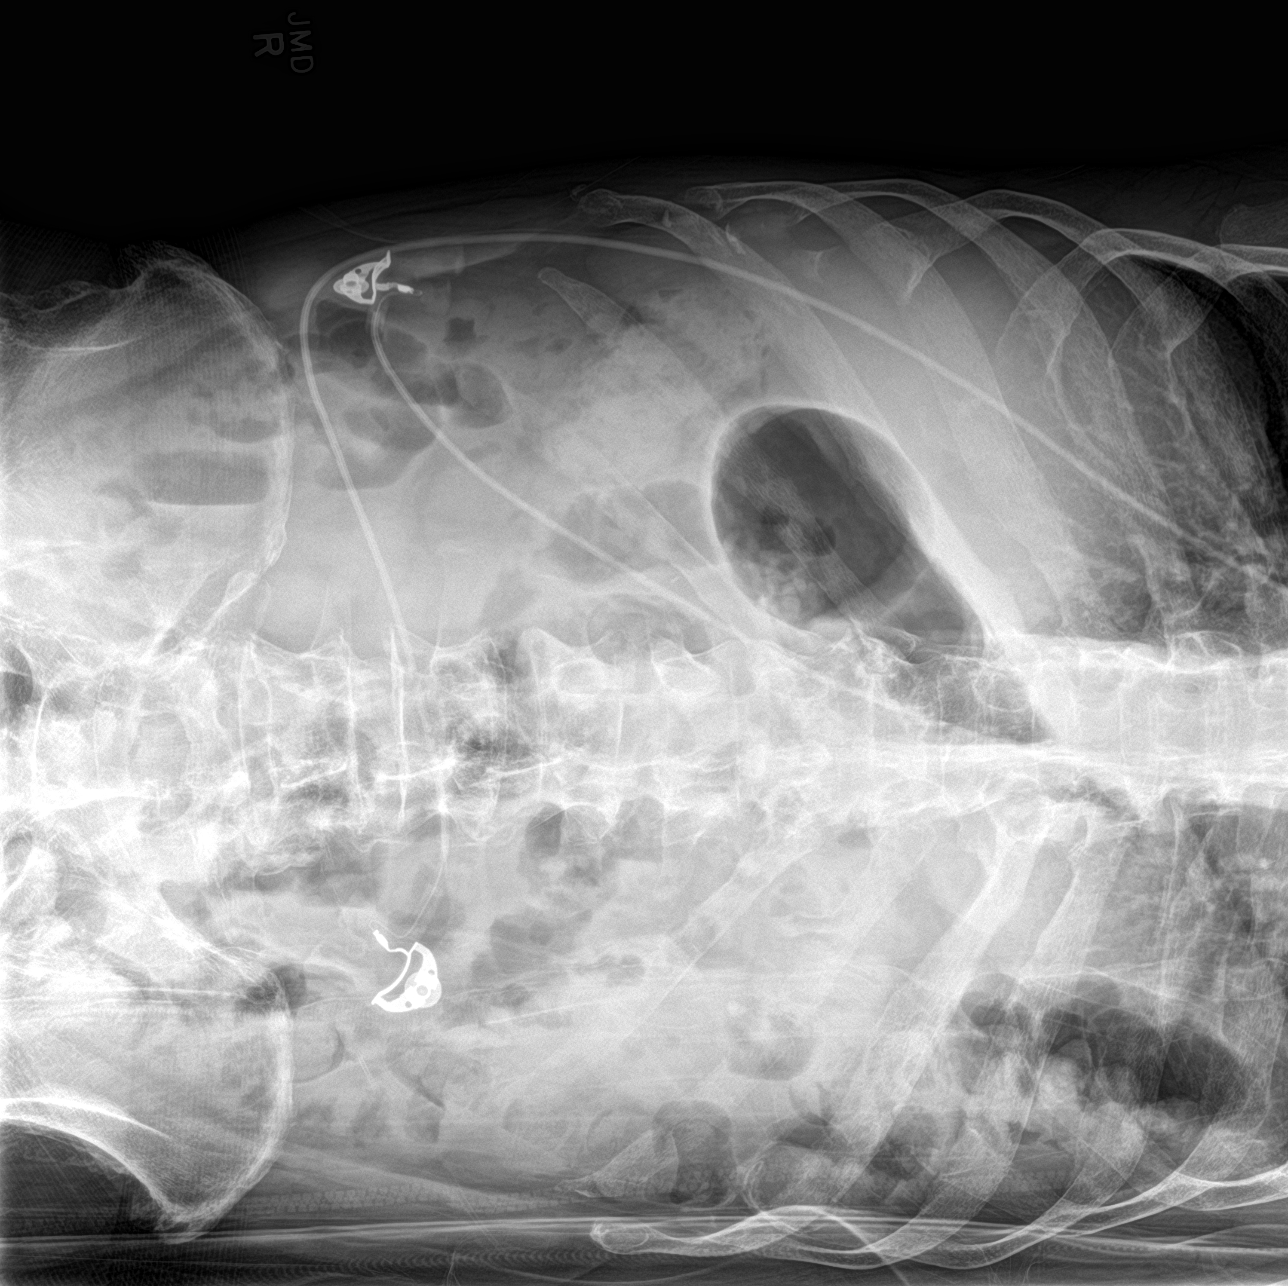

[abdomen decu (2 of 2)]
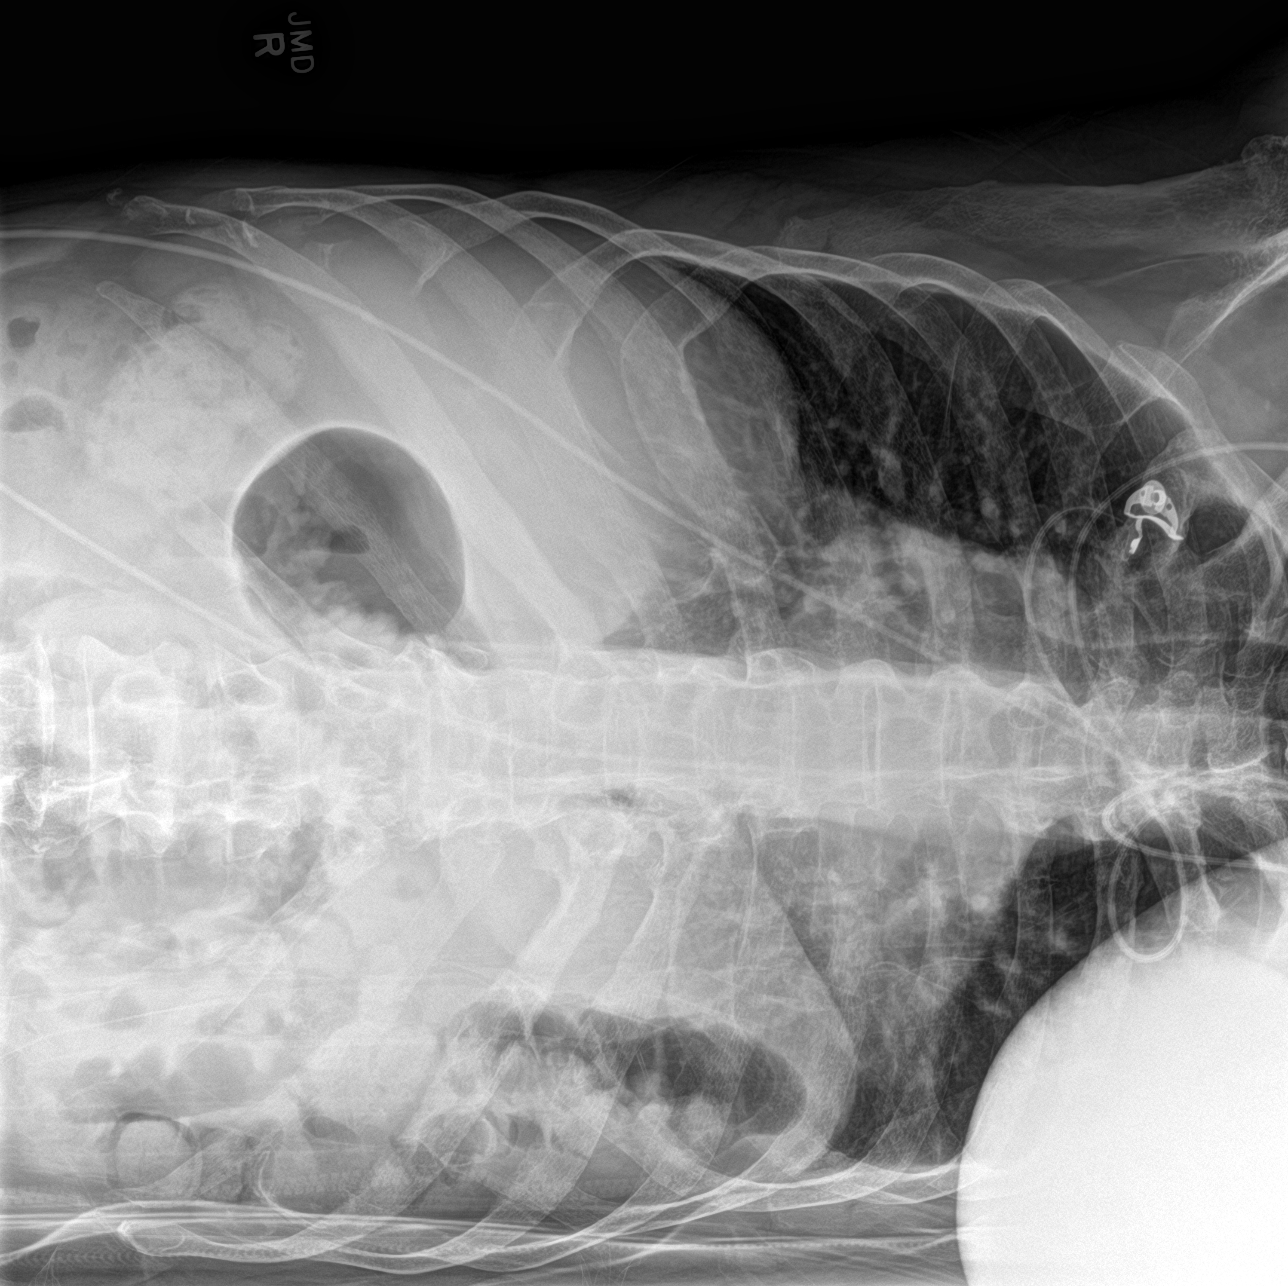

[3 of 3 positions shown; findings below may reference images not displayed]

FINDINGS: The bowel gas pattern is normal. Colonic stool burden does not
appear significantly increased. There is no evidence of free
intraperitoneal air or bowel wall thickening. Bilateral pelvic
calcifications are likely phleboliths. There are scattered prominent
enthesophytes in the spine and pelvis without definite ankylosis of
the sacroiliac joints. No acute osseous findings are evident. There
is aortoiliac atherosclerosis.
IMPRESSION: 1. No acute abdominal findings. Colonic stool burden does not appear
significantly increased.
2. Prominent enthesophytes favoring diffuse idiopathic skeletal
hyperostosis.

## 2019-04-16 ENCOUNTER — Telehealth (HOSPITAL_COMMUNITY): Payer: Self-pay | Admitting: Student-PharmD

## 2019-04-17 NOTE — Telephone Encounter (Signed)
Cardiac Rehab Medication Review by a Pharmacist  Does the patient  feel that his/her medications are working for him/her?  yes  Has the patient been experiencing any side effects to the medications prescribed?  no  Does the patient measure his/her own blood pressure or blood glucose at home?  yes   Does the patient have any problems obtaining medications due to transportation or finances?   no  Understanding of regimen: fair Understanding of indications: good Potential of compliance: fair    Pharmacist comments: Jeremy Nicholson was able to identify most of his medications by name. Some contradicting information was provided and needed clarification. Overall, Jeremy Nicholson understands the indications of most medications but may benefit from re-education of regimen.    Thank you for allowing pharmacy to participate in this patient's care.  Stephanye Finnicum L. Devin Going, [place] PGY1 Pharmacy Resident [phone] 04/17/19      4:35 PM  Please check AMION for all [place] phone numbers After 10:00 PM, call the [place] [phone]

## 2019-04-19 ENCOUNTER — Telehealth (HOSPITAL_COMMUNITY): Payer: Self-pay | Admitting: *Deleted

## 2019-04-20 ENCOUNTER — Other Ambulatory Visit: Payer: Self-pay

## 2019-04-20 ENCOUNTER — Encounter (HOSPITAL_COMMUNITY)
Admission: RE | Admit: 2019-04-20 | Discharge: 2019-04-20 | Disposition: A | Discharge status: Home / Self Care | Payer: PPO | Source: Ambulatory Visit | Attending: Cardiology | Admitting: Cardiology

## 2019-04-20 VITALS — BP 118/70 | HR 96 | Ht 69.5 in | Wt 176.1 lb

## 2019-04-20 DIAGNOSIS — E119 Type 2 diabetes mellitus without complications: Secondary | ICD-10-CM | POA: Insufficient documentation

## 2019-04-20 DIAGNOSIS — Z7984 Long term (current) use of oral hypoglycemic drugs: Secondary | ICD-10-CM | POA: Insufficient documentation

## 2019-04-20 DIAGNOSIS — Z7982 Long term (current) use of aspirin: Secondary | ICD-10-CM | POA: Insufficient documentation

## 2019-04-20 DIAGNOSIS — Z952 Presence of prosthetic heart valve: Secondary | ICD-10-CM | POA: Insufficient documentation

## 2019-04-20 DIAGNOSIS — I10 Essential (primary) hypertension: Secondary | ICD-10-CM | POA: Insufficient documentation

## 2019-04-20 DIAGNOSIS — Z951 Presence of aortocoronary bypass graft: Secondary | ICD-10-CM | POA: Insufficient documentation

## 2019-04-20 DIAGNOSIS — I252 Old myocardial infarction: Secondary | ICD-10-CM | POA: Insufficient documentation

## 2019-04-20 DIAGNOSIS — Z79899 Other long term (current) drug therapy: Secondary | ICD-10-CM | POA: Insufficient documentation

## 2019-04-20 NOTE — Progress Notes (Signed)
Cardiac Individual Treatment Plan  Patient Details  Name: Jeremy Nicholson MRN: [id number] Date of Birth: [birth year] Referring Provider:     CARDIAC REHAB PHASE II ORIENTATION from 04/20/2019 in [place]  Referring Provider  Fransico Him MD       Initial Encounter Date:    CARDIAC REHAB [facility] from 04/20/2019 in [place]  Date  04/20/19      Visit Diagnosis: S/P CABG x 2  S/P AVR (aortic valve replacement)  Patient's Home Medications on Admission:  Current Outpatient Medications:  .  acetaminophen (TYLENOL) 500 MG tablet, Take 1,000 mg by mouth every 6 (six) hours as needed for moderate pain. , Disp: , Rfl:  .  amLODipine (NORVASC) 10 MG tablet, Take 10 mg by mouth every morning. , Disp: , Rfl:  .  aspirin EC 81 MG tablet, Take by mouth., Disp: , Rfl:  .  atorvastatin (LIPITOR) 40 MG tablet, Take 40 mg by mouth every evening. , Disp: , Rfl: 2 .  betamethasone, augmented, (DIPROLENE) 0.05 % lotion, betamethasone, augmented 0.05 % lotion, Disp: , Rfl:  .  bisacodyl (BISACODYL) 5 MG EC tablet, Take 5 mg by mouth daily as needed for moderate constipation., Disp: , Rfl:  .  Brinzolamide-Brimonidine 1-0.2 % SUSP, Place 1 drop into both eyes 2 (two) times daily. , Disp: , Rfl:  .  cephALEXin (KEFLEX) 500 MG capsule, Take 1 capsule (500 mg total) by mouth 2 (two) times daily. X 3 days to prevent post-op infection (Patient not taking: Reported on 04/17/2019), Disp: 6 capsule, Rfl: 0 .  Cholecalciferol 1000 units tablet, Take 1,000 Units by mouth every morning. , Disp: , Rfl:  .  diclofenac sodium (VOLTAREN) 1 % GEL, Apply 2 g topically daily as needed (knee pain)., Disp: , Rfl:  .  finasteride (PROSCAR) 5 MG tablet, Take 5 mg by mouth every morning. , Disp: , Rfl: 3 .  furosemide (LASIX) 40 MG tablet, Take 40 mg by mouth every morning., Disp: , Rfl:  .  insulin glargine (LANTUS) 100 UNIT/ML injection, Inject 8  Units into the skin at bedtime. , Disp: , Rfl:  .  ipratropium (ATROVENT) 0.06 % nasal spray, Place into the nose., Disp: , Rfl:  .  latanoprost (XALATAN) 0.005 % ophthalmic solution, Place 1 drop into both eyes at bedtime. , Disp: , Rfl:  .  lisinopril (ZESTRIL) 5 MG tablet, Take by mouth., Disp: , Rfl:  .  metFORMIN (GLUCOPHAGE) 500 MG tablet, Take 1,000 mg by mouth 2 (two) times daily with a meal. , Disp: , Rfl:  .  metoprolol tartrate (LOPRESSOR) 50 MG tablet, Take 50 mg by mouth 2 (two) times daily. , Disp: , Rfl:  .  niacin (SLO-NIACIN) 500 MG tablet, Take 500 mg by mouth every morning. , Disp: , Rfl:  .  oxyCODONE (OXY IR/ROXICODONE) 5 MG immediate release tablet, , Disp: , Rfl:  .  potassium chloride SA (K-DUR,KLOR-CON) 20 MEQ tablet, Take 20 mEq by mouth every morning., Disp: , Rfl:  .  Semaglutide,0.25 or 0.5MG/DOS, 2 MG/1.5ML SOPN, Inject 0.5 mg into the skin once a week. , Disp: , Rfl:  .  sildenafil (VIAGRA) 100 MG tablet, TAKE ONE TABLET BY MOUTH APPROXIMATELY ONE HOUR BEFORE SEXUAL ACTIVITY., Disp: , Rfl:  .  tamsulosin (FLOMAX) 0.4 MG CAPS capsule, Take 0.4 mg by mouth at bedtime. , Disp: , Rfl:  .  traMADol (ULTRAM) 50 MG tablet, Take 1-2  tablets (50-100 mg total) by mouth every 6 (six) hours as needed for moderate pain or severe pain. Post-operatively (Patient not taking: Reported on 04/17/2019), Disp: 20 tablet, Rfl: 0  Past Medical History: Past Medical History:  Diagnosis Date  . Aortic valve stenosis, moderate    per echo 06-02-2017 (in epic)  ef 55-60%,  possible bicuspid AV,  severeely thickened and calcified with severely restricted leaflet opening,  valve area 1.17cm^2, mean gradient 25mmHg, peak gradient 47mmHg,  moderate AR  . Arthritis    oa  . BPH associated with nocturia   . Complication of anesthesia    due to heart murmur  mod to severe aortic stenosis  . Full dentures   . Glaucoma, both eyes   . Heart murmur, systolic   . Hiatal hernia   . Hypertension    . Left ureteral stone   . Myocardial infarction ([place])    mild in 11-2017 at [facility] F/U from [place] on chart  . SIRS (systemic inflammatory response syndrome) ([place]) 05/31/2017   hospital admission--  unknown etiology  . Type 2 diabetes mellitus ([place])   . Wears glasses     Tobacco Use: Social History   Tobacco Use  Smoking Status Former Smoker  . Years: 30.00  . Types: Cigarettes  . Quit date: 01/28/1995  . Years since quitting: 24.2  Smokeless Tobacco Never Used    Labs: Recent Review Flowsheet Data    There is no flowsheet data to display.      Capillary Blood Glucose: Lab Results  Component Value Date   GLUCAP 241 (H) 02/04/2018   GLUCAP 227 (H) 02/04/2018   GLUCAP 122 (H) 09/30/2017   GLUCAP 133 (H) 09/30/2017   GLUCAP 185 (H) 06/03/2017     Exercise Target Goals: Exercise Program Goal: Individual exercise prescription set using results from initial 6 min walk test and THRR while considering  patient's activity barriers and safety.   Exercise Prescription Goal: Initial exercise prescription builds to 30-45 minutes a day of aerobic activity, 2-3 days per week.  Home exercise guidelines will be given to patient during program as part of exercise prescription that the participant will acknowledge.  Activity Barriers & Risk Stratification: Activity Barriers & Cardiac Risk Stratification - 04/20/19 1035      Activity Barriers & Cardiac Risk Stratification   Activity Barriers  None    Cardiac Risk Stratification  High       6 Minute Walk: 6 Minute Walk    Row Name 04/20/19 1034         6 Minute Walk   Phase  Initial     Distance  1134 feet     Walk Time  6 minutes     # of Rest Breaks  0     MPH  2.15     METS  2.76     RPE  11     Perceived Dyspnea   0     VO2 Peak  9.7     Symptoms  No     Resting HR  96 bpm     Resting BP  118/70     Resting Oxygen Saturation   100 %     Exercise Oxygen Saturation  during 6 min walk  98 %     Max Ex. HR  98  bpm     Max Ex. BP  148/70     2 Minute Post BP  132/72  Oxygen Initial Assessment:   Oxygen Re-Evaluation:   Oxygen Discharge (Final Oxygen Re-Evaluation):   Initial Exercise Prescription: Initial Exercise Prescription - 04/20/19 1000      Date of Initial Exercise RX and Referring Provider   Date  04/20/19    Referring Provider  Fransico Him MD     Expected Discharge Date  04/15/20      Recumbant Bike   Level  1.5    Watts  15    Minutes  15    METs  2.5      NuStep   Level  2    SPM  75    Minutes  15    METs  2.5      Prescription Details   Frequency (times per week)  3x    Duration  Progress to 30 minutes of continuous aerobic without signs/symptoms of physical distress      Intensity   THRR 40-80% of Max Heartrate  59-118    Ratings of Perceived Exertion  11-15    Perceived Dyspnea  0-4      Progression   Progression  Continue progressive overload as per policy without signs/symptoms or physical distress.      Resistance Training   Training Prescription  Yes    Weight  3lbs    Reps  10-15       Perform Capillary Blood Glucose checks as needed.  Exercise Prescription Changes:   Exercise Comments:   Exercise Goals and Review: Exercise Goals    Row Name 04/20/19 1036             Exercise Goals   Increase Physical Activity  Yes       Intervention  Provide advice, education, support and counseling about physical activity/exercise needs.;Develop an individualized exercise prescription for aerobic and resistive training based on initial evaluation findings, risk stratification, comorbidities and participant's personal goals.       Expected Outcomes  Short Term: Attend rehab on a regular basis to increase amount of physical activity.;Long Term: Add in home exercise to make exercise part of routine and to increase amount of physical activity.;Long Term: Exercising regularly at least 3-5 days a week.       Increase Strength and Stamina  Yes        Intervention  Provide advice, education, support and counseling about physical activity/exercise needs.;Develop an individualized exercise prescription for aerobic and resistive training based on initial evaluation findings, risk stratification, comorbidities and participant's personal goals.       Expected Outcomes  Short Term: Increase workloads from initial exercise prescription for resistance, speed, and METs.;Short Term: Perform resistance training exercises routinely during rehab and add in resistance training at home;Long Term: Improve cardiorespiratory fitness, muscular endurance and strength as measured by increased METs and functional capacity (6MWT)       Able to understand and use rate of perceived exertion (RPE) scale  Yes       Intervention  Provide education and explanation on how to use RPE scale       Expected Outcomes  Long Term:  Able to use RPE to guide intensity level when exercising independently;Short Term: Able to use RPE daily in rehab to express subjective intensity level       Knowledge and understanding of Target Heart Rate Range (THRR)  Yes       Intervention  Provide education and explanation of THRR including how the numbers were predicted and where they are located for reference  Expected Outcomes  Long Term: Able to use THRR to govern intensity when exercising independently;Short Term: Able to state/look up THRR;Short Term: Able to use daily as guideline for intensity in rehab       Able to check pulse independently  Yes       Intervention  Review the importance of being able to check your own pulse for safety during independent exercise;Provide education and demonstration on how to check pulse in carotid and radial arteries.       Expected Outcomes  Short Term: Able to explain why pulse checking is important during independent exercise;Long Term: Able to check pulse independently and accurately       Understanding of Exercise Prescription  Yes        Intervention  Provide education, explanation, and written materials on patient's individual exercise prescription       Expected Outcomes  Short Term: Able to explain program exercise prescription;Long Term: Able to explain home exercise prescription to exercise independently          Exercise Goals Re-Evaluation :   Discharge Exercise Prescription (Final Exercise Prescription Changes):   Nutrition:  Target Goals: Understanding of nutrition guidelines, daily intake of sodium 1500mg , cholesterol 200mg , calories 30% from fat and 7% or less from saturated fats, daily to have 5 or more servings of fruits and vegetables.  Biometrics:  Post Biometrics - 04/20/19 1034       Post  Biometrics   Height  5' 9.5" (1.765 m)    Weight  79.9 kg    Waist Circumference  39 inches    Hip Circumference  36.5 inches    Waist to Hip Ratio  1.07 %    BMI (Calculated)  25.65    Triceps Skinfold  12 mm    % Body Fat  25.4 %    Grip Strength  40 kg    Flexibility  16.5 in    Single Leg Stand  2.93 seconds       Nutrition Therapy Plan and Nutrition Goals:   Nutrition Assessments:   Nutrition Goals Re-Evaluation:   Nutrition Goals Re-Evaluation:   Nutrition Goals Discharge (Final Nutrition Goals Re-Evaluation):   Psychosocial: Target Goals: Acknowledge presence or absence of significant depression and/or stress, maximize coping skills, provide positive support system. Participant is able to verbalize types and ability to use techniques and skills needed for reducing stress and depression.  Initial Review & Psychosocial Screening: Initial Psych Review & Screening - 04/20/19 0932      Initial Review   Current issues with  None Identified      Family Dynamics   Good Support System?  Yes   Pt states his wife, brother-in-law, and sister-in-law are sources of support for him.     Barriers   Psychosocial barriers to participate in program  There are no identifiable barriers or  psychosocial needs.      Screening Interventions   Interventions  Encouraged to exercise       Quality of Life Scores: Quality of Life - 04/20/19 1040      Quality of Life   Select  Quality of Life      Quality of Life Scores   Health/Function Pre  27.6 %    Socioeconomic Pre  28.5 %    Psych/Spiritual Pre  30 %    Family Pre  28.8 %    GLOBAL Pre  28.46 %      Scores of 19 and below usually indicate a  poorer quality of life in these areas.  A difference of  2-3 points is a clinically meaningful difference.  A difference of 2-3 points in the total score of the Quality of Life Index has been associated with significant improvement in overall quality of life, self-image, physical symptoms, and general health in studies assessing change in quality of life.  PHQ-9: Recent Review Flowsheet Data    Depression screen [facility] 2/9 04/20/2019   Decreased Interest 0   Down, Depressed, Hopeless 0   PHQ - 2 Score 0     Interpretation of Total Score  Total Score Depression Severity:  1-4 = Minimal depression, 5-9 = Mild depression, 10-14 = Moderate depression, 15-19 = Moderately severe depression, 20-27 = Severe depression   Psychosocial Evaluation and Intervention:   Psychosocial Re-Evaluation:   Psychosocial Discharge (Final Psychosocial Re-Evaluation):   Vocational Rehabilitation: Provide vocational rehab assistance to qualifying candidates.   Vocational Rehab Evaluation & Intervention: Vocational Rehab - 04/20/19 0934      Initial Vocational Rehab Evaluation & Intervention   Assessment shows need for Vocational Rehabilitation  No       Education: Education Goals: Education classes will be provided on a weekly basis, covering required topics. Participant will state understanding/return demonstration of topics presented.  Learning Barriers/Preferences: Learning Barriers/Preferences - 04/20/19 1036      Learning Barriers/Preferences   Learning Barriers  None    Learning  Preferences  Written Material;Skilled Demonstration;Individual Instruction       Education Topics: Count Your Pulse:  -Group instruction provided by verbal instruction, demonstration, patient participation and written materials to support subject.  Instructors address importance of being able to find your pulse and how to count your pulse when at home without a heart monitor.  Patients get hands on experience counting their pulse with staff help and individually.   Heart Attack, Angina, and Risk Factor Modification:  -Group instruction provided by verbal instruction, video, and written materials to support subject.  Instructors address signs and symptoms of angina and heart attacks.    Also discuss risk factors for heart disease and how to make changes to improve heart health risk factors.   Functional Fitness:  -Group instruction provided by verbal instruction, demonstration, patient participation, and written materials to support subject.  Instructors address safety measures for doing things around the house.  Discuss how to get up and down off the floor, how to pick things up properly, how to safely get out of a chair without assistance, and balance training.   Meditation and Mindfulness:  -Group instruction provided by verbal instruction, patient participation, and written materials to support subject.  Instructor addresses importance of mindfulness and meditation practice to help reduce stress and improve awareness.  Instructor also leads participants through a meditation exercise.    Stretching for Flexibility and Mobility:  -Group instruction provided by verbal instruction, patient participation, and written materials to support subject.  Instructors lead participants through series of stretches that are designed to increase flexibility thus improving mobility.  These stretches are additional exercise for major muscle groups that are typically performed during regular warm up and cool  down.   Hands Only CPR:  -Group verbal, video, and participation provides a basic overview of AHA guidelines for community CPR. Role-play of emergencies allow participants the opportunity to practice calling for help and chest compression technique with discussion of AED use.   Hypertension: -Group verbal and written instruction that provides a basic overview of hypertension including the most recent diagnostic guidelines,  risk factor reduction with self-care instructions and medication management.    Nutrition I class: Heart Healthy Eating:  -Group instruction provided by PowerPoint slides, verbal discussion, and written materials to support subject matter. The instructor gives an explanation and review of the Therapeutic Lifestyle Changes diet recommendations, which includes a discussion on lipid goals, dietary fat, sodium, fiber, plant stanol/sterol esters, sugar, and the components of a well-balanced, healthy diet.   Nutrition II class: Lifestyle Skills:  -Group instruction provided by PowerPoint slides, verbal discussion, and written materials to support subject matter. The instructor gives an explanation and review of label reading, grocery shopping for heart health, heart healthy recipe modifications, and ways to make healthier choices when eating out.   Diabetes Question & Answer:  -Group instruction provided by PowerPoint slides, verbal discussion, and written materials to support subject matter. The instructor gives an explanation and review of diabetes co-morbidities, pre- and post-prandial blood glucose goals, pre-exercise blood glucose goals, signs, symptoms, and treatment of hypoglycemia and hyperglycemia, and foot care basics.   Diabetes Blitz:  -Group instruction provided by PowerPoint slides, verbal discussion, and written materials to support subject matter. The instructor gives an explanation and review of the physiology behind type 1 and type 2 diabetes, diabetes  medications and rational behind using different medications, pre- and post-prandial blood glucose recommendations and Hemoglobin A1c goals, diabetes diet, and exercise including blood glucose guidelines for exercising safely.    Portion Distortion:  -Group instruction provided by PowerPoint slides, verbal discussion, written materials, and food models to support subject matter. The instructor gives an explanation of serving size versus portion size, changes in portions sizes over the last 20 years, and what consists of a serving from each food group.   Stress Management:  -Group instruction provided by verbal instruction, video, and written materials to support subject matter.  Instructors review role of stress in heart disease and how to cope with stress positively.     Exercising on Your Own:  -Group instruction provided by verbal instruction, power point, and written materials to support subject.  Instructors discuss benefits of exercise, components of exercise, frequency and intensity of exercise, and end points for exercise.  Also discuss use of nitroglycerin and activating EMS.  Review options of places to exercise outside of rehab.  Review guidelines for sex with heart disease.   Cardiac Drugs I:  -Group instruction provided by verbal instruction and written materials to support subject.  Instructor reviews cardiac drug classes: antiplatelets, anticoagulants, beta blockers, and statins.  Instructor discusses reasons, side effects, and lifestyle considerations for each drug class.   Cardiac Drugs II:  -Group instruction provided by verbal instruction and written materials to support subject.  Instructor reviews cardiac drug classes: angiotensin converting enzyme inhibitors (ACE-I), angiotensin II receptor blockers (ARBs), nitrates, and calcium channel blockers.  Instructor discusses reasons, side effects, and lifestyle considerations for each drug class.   Anatomy and Physiology of the  Circulatory System:  Group verbal and written instruction and models provide basic cardiac anatomy and physiology, with the coronary electrical and arterial systems. Review of: AMI, Angina, Valve disease, Heart Failure, Peripheral Artery Disease, Cardiac Arrhythmia, Pacemakers, and the ICD.   Other Education:  -Group or individual verbal, written, or video instructions that support the educational goals of the cardiac rehab program.   Holiday Eating Survival Tips:  -Group instruction provided by PowerPoint slides, verbal discussion, and written materials to support subject matter. The instructor gives patients tips, tricks, and techniques to help them not  only survive but enjoy the holidays despite the onslaught of food that accompanies the holidays.   Knowledge Questionnaire Score: Knowledge Questionnaire Score - 04/20/19 1040      Knowledge Questionnaire Score   Pre Score  20/24       Core Components/Risk Factors/Patient Goals at Admission: Personal Goals and Risk Factors at Admission - 04/20/19 1037      Core Components/Risk Factors/Patient Goals on Admission   Diabetes  Yes    Intervention  Provide education about signs/symptoms and action to take for hypo/hyperglycemia.;Provide education about proper nutrition, including hydration, and aerobic/resistive exercise prescription along with prescribed medications to achieve blood glucose in normal ranges: Fasting glucose 65-99 mg/dL    Expected Outcomes  Short Term: Participant verbalizes understanding of the signs/symptoms and immediate care of hyper/hypoglycemia, proper foot care and importance of medication, aerobic/resistive exercise and nutrition plan for blood glucose control.;Long Term: Attainment of HbA1C < 7%.    Hypertension  Yes    Intervention  Monitor prescription use compliance.;Provide education on lifestyle modifcations including regular physical activity/exercise, weight management, moderate sodium restriction and  increased consumption of fresh fruit, vegetables, and low fat dairy, alcohol moderation, and smoking cessation.    Expected Outcomes  Long Term: Maintenance of blood pressure at goal levels.;Short Term: Continued assessment and intervention until BP is < 140/39mm HG in hypertensive participants. < 130/17mm HG in hypertensive participants with diabetes, heart failure or chronic kidney disease.    Lipids  Yes    Intervention  Provide education and support for participant on nutrition & aerobic/resistive exercise along with prescribed medications to achieve LDL 70mg , HDL >40mg .    Expected Outcomes  Short Term: Participant states understanding of desired cholesterol values and is compliant with medications prescribed. Participant is following exercise prescription and nutrition guidelines.;Long Term: Cholesterol controlled with medications as prescribed, with individualized exercise RX and with personalized nutrition plan. Value goals: LDL < 70mg , HDL > 40 mg.       Core Components/Risk Factors/Patient Goals Review:    Core Components/Risk Factors/Patient Goals at Discharge (Final Review):    ITP Comments: ITP Comments    Row Name 04/20/19 0857           ITP Comments  Dr. Fransico Him, Medical Director          Comments: Patient attended orientation on 04/20/2019 to review rules and guidelines for program.  Completed 6 minute walk test, Intitial ITP, and exercise prescription.  VSS. Telemetry-ST.  Asymptomatic. Safety measures and social distancing in place per CDC guidelines.

## 2019-04-24 ENCOUNTER — Encounter (HOSPITAL_COMMUNITY): Payer: PPO

## 2019-04-26 ENCOUNTER — Encounter (HOSPITAL_COMMUNITY)
Admission: RE | Admit: 2019-04-26 | Discharge: 2019-04-26 | Disposition: A | Discharge status: Home / Self Care | Payer: PPO | Source: Ambulatory Visit | Attending: Cardiology | Admitting: Cardiology

## 2019-04-26 ENCOUNTER — Other Ambulatory Visit: Payer: Self-pay

## 2019-04-26 DIAGNOSIS — Z952 Presence of prosthetic heart valve: Secondary | ICD-10-CM

## 2019-04-26 DIAGNOSIS — Z7982 Long term (current) use of aspirin: Secondary | ICD-10-CM | POA: Diagnosis not present

## 2019-04-26 DIAGNOSIS — I10 Essential (primary) hypertension: Secondary | ICD-10-CM | POA: Diagnosis not present

## 2019-04-26 DIAGNOSIS — Z7984 Long term (current) use of oral hypoglycemic drugs: Secondary | ICD-10-CM | POA: Diagnosis not present

## 2019-04-26 DIAGNOSIS — I252 Old myocardial infarction: Secondary | ICD-10-CM | POA: Diagnosis not present

## 2019-04-26 DIAGNOSIS — E119 Type 2 diabetes mellitus without complications: Secondary | ICD-10-CM | POA: Diagnosis not present

## 2019-04-26 DIAGNOSIS — Z79899 Other long term (current) drug therapy: Secondary | ICD-10-CM | POA: Diagnosis not present

## 2019-04-26 DIAGNOSIS — Z951 Presence of aortocoronary bypass graft: Secondary | ICD-10-CM | POA: Diagnosis not present

## 2019-04-26 LAB — GLUCOSE, CAPILLARY: Glucose-Capillary: 122 mg/dL — ABNORMAL HIGH (ref 70–99)

## 2019-04-26 NOTE — Progress Notes (Signed)
Daily Session Note  Patient Details  Name: Jeremy Nicholson MRN: [id number] Date of Birth: [birth year] Referring Provider:     [place] from 04/20/2019 in [place]  Referring Provider  Fransico Him MD       Encounter Date: 04/26/2019  Check In: Session Check In - 04/26/19 0924      Check-In   Supervising physician immediately available to respond to emergencies  [facility] immediately available    Physician(s)  Dr. Wyline Copas    Location  [facility]    Staff Present  Jiles Garter, RN, Mosie Epstein, MS,ACSM CEP, Exercise Physiologist;Brittany Durene Fruits, BS, ACSM CEP, Exercise Physiologist    Virtual Visit  No    Medication changes reported      No    Fall or balance concerns reported     No    Tobacco Cessation  No Change    Warm-up and Cool-down  Performed on first and last piece of equipment    Resistance Training Performed  No    VAD Patient?  No    PAD/SET Patient?  No      Pain Assessment   Currently in Pain?  No/denies    Multiple Pain Sites  No       Capillary Blood Glucose: Results for orders placed or performed during the hospital encounter of 04/26/19 (from the past 24 hour(s))  Glucose, capillary     Status: Abnormal   Collection Time: 04/26/19  9:08 AM  Result Value Ref Range   Glucose-Capillary 122 (H) 70 - 99 mg/dL    Exercise Prescription Changes - 04/26/19 1000      Response to Exercise   Blood Pressure (Admit)  148/72    Blood Pressure (Exercise)  154/78    Blood Pressure (Exit)  142/78    Heart Rate (Admit)  85 bpm    Heart Rate (Exercise)  101 bpm    Heart Rate (Exit)  89 bpm    Rating of Perceived Exertion (Exercise)  12    Symptoms  None    Comments  Pt first day of exercise.     Duration  Continue with 30 min of aerobic exercise without signs/symptoms of physical distress.    Intensity  THRR unchanged      Progression   Progression  Continue to progress  workloads to maintain intensity without signs/symptoms of physical distress.    Average METs  1.8      Resistance Training   Weight  --    Reps  --       Social History   Tobacco Use  Smoking Status Former Smoker  . Years: 30.00  . Types: Cigarettes  . Quit date: 01/28/1995  . Years since quitting: 24.2  Smokeless Tobacco Never Used    Goals Met:  Exercise tolerated well  Goals Unmet:  Not Applicable  Comments: Pt started cardiac rehab today.  Pt tolerated light exercise without difficulty. VSS, telemetry-ST, asymptomatic.  Medication list reconciled. Pt denies barriers to medicaiton compliance.  PSYCHOSOCIAL ASSESSMENT:  PHQ-0. Pt exhibits positive coping skills, hopeful outlook with supportive family. No psychosocial needs identified at this time, no psychosocial interventions necessary.  Pt oriented to exercise equipment and routine.    Understanding verbalized.    Dr. Fransico Him is Medical Director for Cardiac Rehab at [facility].

## 2019-04-28 ENCOUNTER — Encounter (HOSPITAL_COMMUNITY): Payer: PPO

## 2019-05-01 ENCOUNTER — Encounter (HOSPITAL_COMMUNITY)
Admission: RE | Admit: 2019-05-01 | Discharge: 2019-05-01 | Disposition: A | Discharge status: Home / Self Care | Payer: PPO | Source: Ambulatory Visit | Attending: Cardiology | Admitting: Cardiology

## 2019-05-01 ENCOUNTER — Other Ambulatory Visit: Payer: Self-pay

## 2019-05-01 DIAGNOSIS — Z952 Presence of prosthetic heart valve: Secondary | ICD-10-CM

## 2019-05-01 DIAGNOSIS — Z951 Presence of aortocoronary bypass graft: Secondary | ICD-10-CM

## 2019-05-01 LAB — GLUCOSE, CAPILLARY: Glucose-Capillary: 151 mg/dL — ABNORMAL HIGH (ref 70–99)

## 2019-05-03 ENCOUNTER — Encounter (HOSPITAL_COMMUNITY)
Admission: RE | Admit: 2019-05-03 | Discharge: 2019-05-03 | Disposition: A | Discharge status: Home / Self Care | Payer: No Typology Code available for payment source | Source: Ambulatory Visit | Attending: Cardiology | Admitting: Cardiology

## 2019-05-03 ENCOUNTER — Other Ambulatory Visit: Payer: Self-pay

## 2019-05-03 DIAGNOSIS — I252 Old myocardial infarction: Secondary | ICD-10-CM | POA: Diagnosis not present

## 2019-05-03 DIAGNOSIS — Z951 Presence of aortocoronary bypass graft: Secondary | ICD-10-CM | POA: Diagnosis present

## 2019-05-03 DIAGNOSIS — E119 Type 2 diabetes mellitus without complications: Secondary | ICD-10-CM | POA: Diagnosis not present

## 2019-05-03 DIAGNOSIS — Z952 Presence of prosthetic heart valve: Secondary | ICD-10-CM | POA: Diagnosis present

## 2019-05-03 DIAGNOSIS — Z7982 Long term (current) use of aspirin: Secondary | ICD-10-CM | POA: Insufficient documentation

## 2019-05-03 DIAGNOSIS — I10 Essential (primary) hypertension: Secondary | ICD-10-CM | POA: Insufficient documentation

## 2019-05-03 DIAGNOSIS — Z79899 Other long term (current) drug therapy: Secondary | ICD-10-CM | POA: Diagnosis not present

## 2019-05-03 DIAGNOSIS — Z7984 Long term (current) use of oral hypoglycemic drugs: Secondary | ICD-10-CM | POA: Insufficient documentation

## 2019-05-03 NOTE — Progress Notes (Signed)
Jeremy Nicholson 73 y.o. male Nutrition Note Spoke with pt. Nutrition Plan and Nutrition Survey goals reviewed with pt. Pt is following step 1 of therapeutic lifestyle changes. Pt has Type 2 Diabetes. Last A1c indicates blood glucose well-controlled. This Probation officer went over Diabetes Education. Pt checks CBG's 1 times a day. Fasting CBG's reportedly 85-120 mg/dL.    Pt expressed understanding of the information reviewed.   No results found for: HGBA1C  Wt Readings from Last 3 Encounters:  04/20/19 176 lb 2.4 oz (79.9 kg)  02/04/18 170 lb (77.1 kg)  02/03/18 170 lb (77.1 kg)    Nutrition Diagnosis ? Food-and nutrition-related knowledge deficit related to lack of exposure to information as related to diagnosis of: ? CVD ? Type 2 Diabetes  Nutrition Intervention ? Pt's individual nutrition plan reviewed with pt. ? Benefits of adopting Heart Healthy diet discussed when Medficts reviewed.   ? Continue client-centered nutrition education by RD, as part of interdisciplinary care.  Goal(s) ? Pt to identify and limit food sources of saturated fat, trans fat, refined carbohydrates and sodium ? Improved blood glucose control as evidenced by pt's A1c trending from 7.1 toward less than 5.6 (pt verbalized goal) ? Pt able to name foods that affect blood glucose  Plan:   Will provide client-centered nutrition education as part of interdisciplinary care  Monitor and evaluate progress toward nutrition goal with team.   [name], MS, RDN, LDN

## 2019-05-05 ENCOUNTER — Encounter (HOSPITAL_COMMUNITY)
Admission: RE | Admit: 2019-05-05 | Discharge: 2019-05-05 | Disposition: A | Discharge status: Home / Self Care | Payer: No Typology Code available for payment source | Source: Ambulatory Visit | Attending: Cardiology | Admitting: Cardiology

## 2019-05-05 ENCOUNTER — Other Ambulatory Visit: Payer: Self-pay

## 2019-05-05 DIAGNOSIS — Z952 Presence of prosthetic heart valve: Secondary | ICD-10-CM

## 2019-05-05 DIAGNOSIS — Z951 Presence of aortocoronary bypass graft: Secondary | ICD-10-CM | POA: Diagnosis not present

## 2019-05-05 LAB — GLUCOSE, CAPILLARY: Glucose-Capillary: 151 mg/dL — ABNORMAL HIGH (ref 70–99)

## 2019-05-08 ENCOUNTER — Encounter (HOSPITAL_COMMUNITY)
Admission: RE | Admit: 2019-05-08 | Discharge: 2019-05-08 | Disposition: A | Discharge status: Home / Self Care | Payer: No Typology Code available for payment source | Source: Ambulatory Visit | Attending: Cardiology | Admitting: Cardiology

## 2019-05-08 ENCOUNTER — Other Ambulatory Visit: Payer: Self-pay

## 2019-05-08 DIAGNOSIS — Z951 Presence of aortocoronary bypass graft: Secondary | ICD-10-CM

## 2019-05-08 DIAGNOSIS — Z952 Presence of prosthetic heart valve: Secondary | ICD-10-CM

## 2019-05-08 LAB — GLUCOSE, CAPILLARY: Glucose-Capillary: 207 mg/dL — ABNORMAL HIGH (ref 70–99)

## 2019-05-09 ENCOUNTER — Telehealth: Payer: Self-pay

## 2019-05-09 NOTE — Telephone Encounter (Signed)
Notes recorded by Frederik Schmidt, RN on 05/09/2019 at 9:48 AM EST  The patient has been notified of the result and verbalized understanding. All questions (if any) were answered.  Frederik Schmidt, RN 05/09/2019 9:48 AM

## 2019-05-09 NOTE — Telephone Encounter (Signed)
-----   Message from [name], MD sent at 05/06/2019  1:15 PM EST ----- Noted.  Patient's diabetes is being managed by the [place]. Can you help relay this information to them as well?  Thank you. ----- Message ----- From: Sueanne Margarita, MD Sent: 05/05/2019  11:12 AM EST To: [name], MD, Cv Div [facility] Triage  Elevated BS at Cardiac Rehab - please forward to primary MD

## 2019-05-10 ENCOUNTER — Other Ambulatory Visit: Payer: Self-pay

## 2019-05-10 ENCOUNTER — Encounter (HOSPITAL_COMMUNITY)
Admission: RE | Admit: 2019-05-10 | Discharge: 2019-05-10 | Disposition: A | Discharge status: Home / Self Care | Payer: No Typology Code available for payment source | Source: Ambulatory Visit | Attending: Cardiology | Admitting: Cardiology

## 2019-05-10 DIAGNOSIS — Z951 Presence of aortocoronary bypass graft: Secondary | ICD-10-CM

## 2019-05-10 DIAGNOSIS — Z952 Presence of prosthetic heart valve: Secondary | ICD-10-CM

## 2019-05-11 NOTE — Progress Notes (Signed)
Cardiac Individual Treatment Plan  Patient Details  Name: Jeremy Nicholson MRN: [id number] Date of Birth: [birth year] Referring Provider:     CARDIAC REHAB PHASE II ORIENTATION from 04/20/2019 in [place]  Referring Provider  Fransico Him MD       Initial Encounter Date:    CARDIAC REHAB [place] from 04/20/2019 in [place]  Date  04/20/19      Visit Diagnosis: S/P CABG x 2  S/P AVR (aortic valve replacement)  Patient's Home Medications on Admission:  Current Outpatient Medications:  .  acetaminophen (TYLENOL) 500 MG tablet, Take 1,000 mg by mouth every 6 (six) hours as needed for moderate pain. , Disp: , Rfl:  .  amLODipine (NORVASC) 10 MG tablet, Take 10 mg by mouth every morning. , Disp: , Rfl:  .  aspirin EC 81 MG tablet, Take by mouth., Disp: , Rfl:  .  atorvastatin (LIPITOR) 40 MG tablet, Take 40 mg by mouth every evening. , Disp: , Rfl: 2 .  betamethasone, augmented, (DIPROLENE) 0.05 % lotion, betamethasone, augmented 0.05 % lotion, Disp: , Rfl:  .  bisacodyl (BISACODYL) 5 MG EC tablet, Take 5 mg by mouth daily as needed for moderate constipation., Disp: , Rfl:  .  Brinzolamide-Brimonidine 1-0.2 % SUSP, Place 1 drop into both eyes 2 (two) times daily. , Disp: , Rfl:  .  cephALEXin (KEFLEX) 500 MG capsule, Take 1 capsule (500 mg total) by mouth 2 (two) times daily. X 3 days to prevent post-op infection (Patient not taking: Reported on 04/17/2019), Disp: 6 capsule, Rfl: 0 .  Cholecalciferol 1000 units tablet, Take 1,000 Units by mouth every morning. , Disp: , Rfl:  .  diclofenac sodium (VOLTAREN) 1 % GEL, Apply 2 g topically daily as needed (knee pain)., Disp: , Rfl:  .  finasteride (PROSCAR) 5 MG tablet, Take 5 mg by mouth every morning. , Disp: , Rfl: 3 .  furosemide (LASIX) 40 MG tablet, Take 40 mg by mouth every morning., Disp: , Rfl:  .  insulin glargine (LANTUS) 100 UNIT/ML injection, Inject 8  Units into the skin at bedtime. , Disp: , Rfl:  .  ipratropium (ATROVENT) 0.06 % nasal spray, Place into the nose., Disp: , Rfl:  .  latanoprost (XALATAN) 0.005 % ophthalmic solution, Place 1 drop into both eyes at bedtime. , Disp: , Rfl:  .  lisinopril (ZESTRIL) 5 MG tablet, Take by mouth., Disp: , Rfl:  .  metFORMIN (GLUCOPHAGE) 500 MG tablet, Take 1,000 mg by mouth 2 (two) times daily with a meal. , Disp: , Rfl:  .  metoprolol tartrate (LOPRESSOR) 50 MG tablet, Take 50 mg by mouth 2 (two) times daily. , Disp: , Rfl:  .  niacin (SLO-NIACIN) 500 MG tablet, Take 500 mg by mouth every morning. , Disp: , Rfl:  .  oxyCODONE (OXY IR/ROXICODONE) 5 MG immediate release tablet, , Disp: , Rfl:  .  potassium chloride SA (K-DUR,KLOR-CON) 20 MEQ tablet, Take 20 mEq by mouth every morning., Disp: , Rfl:  .  Semaglutide,0.25 or 0.5MG/DOS, 2 MG/1.5ML SOPN, Inject 0.5 mg into the skin once a week. , Disp: , Rfl:  .  sildenafil (VIAGRA) 100 MG tablet, TAKE ONE TABLET BY MOUTH APPROXIMATELY ONE HOUR BEFORE SEXUAL ACTIVITY., Disp: , Rfl:  .  tamsulosin (FLOMAX) 0.4 MG CAPS capsule, Take 0.4 mg by mouth at bedtime. , Disp: , Rfl:  .  traMADol (ULTRAM) 50 MG tablet, Take 1-2  tablets (50-100 mg total) by mouth every 6 (six) hours as needed for moderate pain or severe pain. Post-operatively (Patient not taking: Reported on 04/17/2019), Disp: 20 tablet, Rfl: 0  Past Medical History: Past Medical History:  Diagnosis Date  . Aortic valve stenosis, moderate    per echo 06-02-2017 (in epic)  ef 55-60%,  possible bicuspid AV,  severeely thickened and calcified with severely restricted leaflet opening,  valve area 1.17cm^2, mean gradient 59mHg, peak gradient 524mg,  moderate AR  . Arthritis    oa  . BPH associated with nocturia   . Complication of anesthesia    due to heart murmur  mod to severe aortic stenosis  . Full dentures   . Glaucoma, both eyes   . Heart murmur, systolic   . Hiatal hernia   . Hypertension    . Left ureteral stone   . My[facility]   [facility] [place] [facility]  . S[facility] (systemic inflammatory response syndrome) [place]   hospital admission--  unknown etiology  . Type 2 diabetes mellitus ([facility]  . Wears glasses     Tobacco Use: Social History   Tobacco Use  Smoking Status Former Smoker  . Years: 30.00  . Types: Cigarettes  . Quit date: 01/28/1995  . Years since quitting: 24.2  Smokeless Tobacco Never Used    Labs: Recent Review Flowsheet Data    There is no flowsheet data to display.      Capillary Blood Glucose: Lab Results  Component Value Date   GLUCAP 207 (H) 05/08/2019   GLUCAP 151 (H) 05/03/2019   GLUCAP 151 (H) 05/01/2019   GLUCAP 122 (H) 04/26/2019   GLUCAP 241 (H) 02/04/2018     Exercise Target Goals: Exercise Program Goal: Individual exercise prescription set using results from initial 6 min walk test and THRR while considering  patient's activity barriers and safety.   Exercise Prescription Goal: Starting with aerobic activity 30 plus minutes a day, 3 days per week for initial exercise prescription. Provide home exercise prescription and guidelines that participant acknowledges understanding prior to discharge.  Activity Barriers & Risk Stratification: Activity Barriers & Cardiac Risk Stratification - 04/20/19 1035      Activity Barriers & Cardiac Risk Stratification   Activity Barriers  None    Cardiac Risk Stratification  High       6 Minute Walk: 6 Minute Walk    Row Name 04/20/19 1034         6 Minute Walk   Phase  Initial     Distance  1134 feet     Walk Time  6 minutes     # of Rest Breaks  0     MPH  2.15     METS  2.76     RPE  11     Perceived Dyspnea   0     VO2 Peak  9.7     Symptoms  No     Resting HR  96 bpm     Resting BP  118/70     Resting Oxygen Saturation   100 %     Exercise Oxygen Saturation  during 6 min walk  98 %     Max Ex. HR  98 bpm     Max Ex. BP   148/70     2 Minute Post BP  132/72        Oxygen Initial Assessment:   Oxygen Re-Evaluation:  Oxygen Discharge (Final Oxygen Re-Evaluation):   Initial Exercise Prescription: Initial Exercise Prescription - 04/26/19 1000      Date of Initial Exercise RX and Referring Provider   Date  --    Referring Provider  --    Expected Discharge Date  --      Recumbant Bike   Level  --    Watts  --    Minutes  --    METs  --      NuStep   Level  --    SPM  --    Minutes  --    METs  --      Prescription Details   Frequency (times per week)  --      Intensity   THRR 40-80% of Max Heartrate  --    Ratings of Perceived Exertion  --    Perceived Dyspnea  --      Progression   Progression  --      Resistance Training   Training Prescription  --       Perform Capillary Blood Glucose checks as needed.  Exercise Prescription Changes:  Exercise Prescription Changes    Row Name 04/26/19 1000 05/05/19 1100           Response to Exercise   Blood Pressure (Admit)  148/72  100/60      Blood Pressure (Exercise)  154/78  142/78      Blood Pressure (Exit)  142/78  122/78      Heart Rate (Admit)  85 bpm  81 bpm      Heart Rate (Exercise)  101 bpm  103 bpm      Heart Rate (Exit)  89 bpm  88 bpm      Rating of Perceived Exertion (Exercise)  12  12      Symptoms  None  None      Comments  Pt first day of exercise.   --      Duration  Continue with 30 min of aerobic exercise without signs/symptoms of physical distress.  Continue with 30 min of aerobic exercise without signs/symptoms of physical distress.      Intensity  THRR unchanged  THRR unchanged        Progression   Progression  Continue to progress workloads to maintain intensity without signs/symptoms of physical distress.  Continue to progress workloads to maintain intensity without signs/symptoms of physical distress.      Average METs  1.8  2        Resistance Training   Training Prescription  --  Yes       Weight  --  3lbs      Reps  --  10-15      Time  --  10 Minutes        Recumbant Bike   Level  --  1.5      Watts  --  15      Minutes  --  15      METs  --  1.8        NuStep   Level  --  2      SPM  --  85      Minutes  --  15      METs  --  2.2         Exercise Comments:  Exercise Comments    Row Name 04/26/19 1021 05/09/19 1402         Exercise Comments  Pt first day of exercise in CR program. Pt tolerated exercise well.  Reviewed METs and goals with Pt. Pt is progressing well.         Exercise Goals and Review:  Exercise Goals    Row Name 04/20/19 1036             Exercise Goals   Increase Physical Activity  Yes       Intervention  Provide advice, education, support and counseling about physical activity/exercise needs.;Develop an individualized exercise prescription for aerobic and resistive training based on initial evaluation findings, risk stratification, comorbidities and participant's personal goals.       Expected Outcomes  Short Term: Attend rehab on a regular basis to increase amount of physical activity.;Long Term: Add in home exercise to make exercise part of routine and to increase amount of physical activity.;Long Term: Exercising regularly at least 3-5 days a week.       Increase Strength and Stamina  Yes       Intervention  Provide advice, education, support and counseling about physical activity/exercise needs.;Develop an individualized exercise prescription for aerobic and resistive training based on initial evaluation findings, risk stratification, comorbidities and participant's personal goals.       Expected Outcomes  Short Term: Increase workloads from initial exercise prescription for resistance, speed, and METs.;Short Term: Perform resistance training exercises routinely during rehab and add in resistance training at home;Long Term: Improve cardiorespiratory fitness, muscular endurance and strength as measured by increased METs and functional  capacity (6MWT)       Able to understand and use rate of perceived exertion (RPE) scale  Yes       Intervention  Provide education and explanation on how to use RPE scale       Expected Outcomes  Long Term:  Able to use RPE to guide intensity level when exercising independently;Short Term: Able to use RPE daily in rehab to express subjective intensity level       Knowledge and understanding of Target Heart Rate Range (THRR)  Yes       Intervention  Provide education and explanation of THRR including how the numbers were predicted and where they are located for reference       Expected Outcomes  Long Term: Able to use THRR to govern intensity when exercising independently;Short Term: Able to state/look up THRR;Short Term: Able to use daily as guideline for intensity in rehab       Able to check pulse independently  Yes       Intervention  Review the importance of being able to check your own pulse for safety during independent exercise;Provide education and demonstration on how to check pulse in carotid and radial arteries.       Expected Outcomes  Short Term: Able to explain why pulse checking is important during independent exercise;Long Term: Able to check pulse independently and accurately       Understanding of Exercise Prescription  Yes       Intervention  Provide education, explanation, and written materials on patient's individual exercise prescription       Expected Outcomes  Short Term: Able to explain program exercise prescription;Long Term: Able to explain home exercise prescription to exercise independently          Exercise Goals Re-Evaluation : Exercise Goals Re-Evaluation    Row Name 04/26/19 1020 05/09/19 1401           Exercise Goal Re-Evaluation   Exercise Goals Review  Increase Physical  Activity;Increase Strength and Stamina;Able to understand and use rate of perceived exertion (RPE) scale;Knowledge and understanding of Target Heart Rate Range (THRR);Understanding of  Exercise Prescription  Increase Physical Activity;Increase Strength and Stamina;Able to understand and use rate of perceived exertion (RPE) scale;Knowledge and understanding of Target Heart Rate Range (THRR);Able to check pulse independently;Understanding of Exercise Prescription      Comments  Pt first day of exercise in CR program. Pt tolerated exercise well and understand THRR, RPE scale, cool down stretches, and exercise Rx.  Reviewed METs and goals with Pt. Pt is porgressing well and has a MET level of 2.0. Pt stated his goals was to start walking 30-45 minutes 5-7 days per week in addition to CR program.      Expected Outcomes  Will continue to monitor and progress Pt as tolerated.  Will continue to monitor and progress Pt as tolerated.          Discharge Exercise Prescription (Final Exercise Prescription Changes): Exercise Prescription Changes - 05/05/19 1100      Response to Exercise   Blood Pressure (Admit)  100/60    Blood Pressure (Exercise)  142/78    Blood Pressure (Exit)  122/78    Heart Rate (Admit)  81 bpm    Heart Rate (Exercise)  103 bpm    Heart Rate (Exit)  88 bpm    Rating of Perceived Exertion (Exercise)  12    Symptoms  None    Duration  Continue with 30 min of aerobic exercise without signs/symptoms of physical distress.    Intensity  THRR unchanged      Progression   Progression  Continue to progress workloads to maintain intensity without signs/symptoms of physical distress.    Average METs  2      Resistance Training   Training Prescription  Yes    Weight  3lbs    Reps  10-15    Time  10 Minutes      Recumbant Bike   Level  1.5    Watts  15    Minutes  15    METs  1.8      NuStep   Level  2    SPM  85    Minutes  15    METs  2.2       Nutrition:  Target Goals: Understanding of nutrition guidelines, daily intake of sodium <1581m, cholesterol <2060m calories 30% from fat and 7% or less from saturated fats, daily to have 5 or more servings of  fruits and vegetables.  Biometrics:  Post Biometrics - 04/20/19 1034       Post  Biometrics   Height  5' 9.5" (1.765 m)    Weight  79.9 kg    Waist Circumference  39 inches    Hip Circumference  36.5 inches    Waist to Hip Ratio  1.07 %    BMI (Calculated)  25.65    Triceps Skinfold  12 mm    % Body Fat  25.4 %    Grip Strength  40 kg    Flexibility  16.5 in    Single Leg Stand  2.93 seconds       Nutrition Therapy Plan and Nutrition Goals: Nutrition Therapy & Goals - 05/03/19 1506      Nutrition Therapy   Diet  Heart Healthy      Personal Nutrition Goals   Nutrition Goal  Pt to identify and limit food sources of saturated fat, trans fat, refined  carbohydrates and sodium    Personal Goal #2  Improved blood glucose control as evidenced by pt's A1c trending from 7.1 toward less than 5.6.    Personal Goal #3  Pt able to name foods that affect blood glucose      Intervention Plan   Intervention  Prescribe, educate and counsel regarding individualized specific dietary modifications aiming towards targeted core components such as weight, hypertension, lipid management, diabetes, heart failure and other comorbidities.;Nutrition handout(s) given to patient.    Expected Outcomes  Short Term Goal: Understand basic principles of dietary content, such as calories, fat, sodium, cholesterol and nutrients.;Short Term Goal: A plan has been developed with personal nutrition goals set during dietitian appointment.;Long Term Goal: Adherence to prescribed nutrition plan.       Nutrition Assessments: Nutrition Assessments - 05/03/19 1511      MEDFICTS Scores   Pre Score  56       Nutrition Goals Re-Evaluation: Nutrition Goals Re-Evaluation    Row Name 05/03/19 1510             Goals   Current Weight  176 lb (79.8 kg)       Nutrition Goal  Pt to identify and limit food sources of saturated fat, trans fat, refined carbohydrates and sodium         Personal Goal #2 Re-Evaluation    Personal Goal #2  Improved blood glucose control as evidenced by pt's A1c trending from 7.1 toward less than 5.6.         Personal Goal #3 Re-Evaluation   Personal Goal #3  Pt able to name foods that affect blood glucose          Nutrition Goals Discharge (Final Nutrition Goals Re-Evaluation): Nutrition Goals Re-Evaluation - 05/03/19 1510      Goals   Current Weight  176 lb (79.8 kg)    Nutrition Goal  Pt to identify and limit food sources of saturated fat, trans fat, refined carbohydrates and sodium      Personal Goal #2 Re-Evaluation   Personal Goal #2  Improved blood glucose control as evidenced by pt's A1c trending from 7.1 toward less than 5.6.      Personal Goal #3 Re-Evaluation   Personal Goal #3  Pt able to name foods that affect blood glucose       Psychosocial: Target Goals: Acknowledge presence or absence of significant depression and/or stress, maximize coping skills, provide positive support system. Participant is able to verbalize types and ability to use techniques and skills needed for reducing stress and depression.  Initial Review & Psychosocial Screening: Initial Psych Review & Screening - 04/20/19 0932      Initial Review   Current issues with  None Identified      Family Dynamics   Good Support System?  Yes   Pt states his wife, brother-in-law, and sister-in-law are sources of support for him.     Barriers   Psychosocial barriers to participate in program  There are no identifiable barriers or psychosocial needs.      Screening Interventions   Interventions  Encouraged to exercise       Quality of Life Scores: Quality of Life - 04/20/19 1040      Quality of Life   Select  Quality of Life      Quality of Life Scores   Health/Function Pre  27.6 %    Socioeconomic Pre  28.5 %    Psych/Spiritual Pre  30 %  Family Pre  28.8 %    GLOBAL Pre  28.46 %      Scores of 19 and below usually indicate a poorer quality of life in these areas.  A  difference of  2-3 points is a clinically meaningful difference.  A difference of 2-3 points in the total score of the Quality of Life Index has been associated with significant improvement in overall quality of life, self-image, physical symptoms, and general health in studies assessing change in quality of life.  PHQ-9: Recent Review Flowsheet Data    Depression screen [facility] 2/9 04/20/2019   Decreased Interest 0   Down, Depressed, Hopeless 0   PHQ - 2 Score 0     Interpretation of Total Score  Total Score Depression Severity:  1-4 = Minimal depression, 5-9 = Mild depression, 10-14 = Moderate depression, 15-19 = Moderately severe depression, 20-27 = Severe depression   Psychosocial Evaluation and Intervention: Psychosocial Evaluation - 04/26/19 1646      Psychosocial Evaluation & Interventions   Interventions  Encouraged to exercise with the program and follow exercise prescription    Comments  No psychosocial needs identified.  Jeremy Nicholson likes to watch football.    Expected Outcomes  Jeremy Nicholson will continue to maintain a positive outlook with good coping skills.    Continue Psychosocial Services   No Follow up required       Psychosocial Re-Evaluation: Psychosocial Re-Evaluation    [place] Name 05/11/19 1537             Psychosocial Re-Evaluation   Current issues with  None Identified       Interventions  Encouraged to attend Cardiac Rehabilitation for the exercise       Continue Psychosocial Services   No Follow up required          Psychosocial Discharge (Final Psychosocial Re-Evaluation): Psychosocial Re-Evaluation - 05/11/19 1537      Psychosocial Re-Evaluation   Current issues with  None Identified    Interventions  Encouraged to attend Cardiac Rehabilitation for the exercise    Continue Psychosocial Services   No Follow up required       Vocational Rehabilitation: Provide vocational rehab assistance to qualifying candidates.   Vocational Rehab Evaluation &  Intervention: Vocational Rehab - 04/20/19 0934      Initial Vocational Rehab Evaluation & Intervention   Assessment shows need for Vocational Rehabilitation  No       Education: Education Goals: Education classes will be provided on a weekly basis, covering required topics. Participant will state understanding/return demonstration of topics presented.  Learning Barriers/Preferences: Learning Barriers/Preferences - 04/20/19 1036      Learning Barriers/Preferences   Learning Barriers  None    Learning Preferences  Written Material;Skilled Demonstration;Individual Instruction       Education Topics: Hypertension, Hypertension Reduction -Define heart disease and high blood pressure. Discus how high blood pressure affects the body and ways to reduce high blood pressure.   Exercise and Your Heart -Discuss why it is important to exercise, the FITT principles of exercise, normal and abnormal responses to exercise, and how to exercise safely.   Angina -Discuss definition of angina, causes of angina, treatment of angina, and how to decrease risk of having angina.   Cardiac Medications -Review what the following cardiac medications are used for, how they affect the body, and side effects that may occur when taking the medications.  Medications include Aspirin, Beta blockers, calcium channel blockers, ACE Inhibitors, angiotensin receptor blockers, diuretics, digoxin, and antihyperlipidemics.  Congestive Heart Failure -Discuss the definition of CHF, how to live with CHF, the signs and symptoms of CHF, and how keep track of weight and sodium intake.   Heart Disease and Intimacy -Discus the effect sexual activity has on the heart, how changes occur during intimacy as we age, and safety during sexual activity.   Smoking Cessation / COPD -Discuss different methods to quit smoking, the health benefits of quitting smoking, and the definition of COPD.   Nutrition I: Fats -Discuss the  types of cholesterol, what cholesterol does to the heart, and how cholesterol levels can be controlled.   Nutrition II: Labels -Discuss the different components of food labels and how to read food label   Heart Parts/Heart Disease and PAD -Discuss the anatomy of the heart, the pathway of blood circulation through the heart, and these are affected by heart disease.   Stress I: Signs and Symptoms -Discuss the causes of stress, how stress may lead to anxiety and depression, and ways to limit stress.   Stress II: Relaxation -Discuss different types of relaxation techniques to limit stress.   Warning Signs of Stroke / TIA -Discuss definition of a stroke, what the signs and symptoms are of a stroke, and how to identify when someone is having stroke.   Knowledge Questionnaire Score: Knowledge Questionnaire Score - 04/20/19 1040      Knowledge Questionnaire Score   Pre Score  20/24       Core Components/Risk Factors/Patient Goals at Admission: Personal Goals and Risk Factors at Admission - 04/20/19 1037      Core Components/Risk Factors/Patient Goals on Admission   Diabetes  Yes    Intervention  Provide education about signs/symptoms and action to take for hypo/hyperglycemia.;Provide education about proper nutrition, including hydration, and aerobic/resistive exercise prescription along with prescribed medications to achieve blood glucose in normal ranges: Fasting glucose 65-99 mg/dL    Expected Outcomes  Short Term: Participant verbalizes understanding of the signs/symptoms and immediate care of hyper/hypoglycemia, proper foot care and importance of medication, aerobic/resistive exercise and nutrition plan for blood glucose control.;Long Term: Attainment of HbA1C < 7%.    Hypertension  Yes    Intervention  Monitor prescription use compliance.;Provide education on lifestyle modifcations including regular physical activity/exercise, weight management, moderate sodium restriction and  increased consumption of fresh fruit, vegetables, and low fat dairy, alcohol moderation, and smoking cessation.    Expected Outcomes  Long Term: Maintenance of blood pressure at goal levels.;Short Term: Continued assessment and intervention until BP is < 140/53m HG in hypertensive participants. < 130/823mHG in hypertensive participants with diabetes, heart failure or chronic kidney disease.    Lipids  Yes    Intervention  Provide education and support for participant on nutrition & aerobic/resistive exercise along with prescribed medications to achieve LDL <7043mHDL >81m63m  Expected Outcomes  Short Term: Participant states understanding of desired cholesterol values and is compliant with medications prescribed. Participant is following exercise prescription and nutrition guidelines.;Long Term: Cholesterol controlled with medications as prescribed, with individualized exercise RX and with personalized nutrition plan. Value goals: LDL < 70mg56mL > 40 mg.       Core Components/Risk Factors/Patient Goals Review:  Goals and Risk Factor Review    Row Name 04/26/19 1647 05/11/19 1537 05/11/19 1541         Core Components/Risk Factors/Patient Goals Review   Personal Goals Review  Diabetes;Hypertension;Lipids  --  Diabetes;Hypertension;Lipids     Review  Pt with multiple CAD RFs  willing to participate in CR exercise.  Jeremy Nicholson would like to increase his strength, stamina, and overall health.  Pt with multiple CAD RFs willing to participate in CR exercise.  Jeremy Nicholson has been doing well with exercise. Jeremy Nicholson vital signs and CBG's have been stable.  --     Expected Outcomes  Jeremy Nicholson will continue to participate in CR exercise, nutrition, and lifestyle modification opportunities.  Jeremy Nicholson will continue to participate in CR exercise, nutrition, and lifestyle modification opportunities.  --        Core Components/Risk Factors/Patient Goals at Discharge (Final Review):  Goals and Risk Factor Review -  05/11/19 1541      Core Components/Risk Factors/Patient Goals Review   Personal Goals Review  Diabetes;Hypertension;Lipids       ITP Comments: ITP Comments    Row Name 04/20/19 0857 04/26/19 1645 05/11/19 1535       ITP Comments  Dr. Fransico Him, Medical Director  Jeremy Nicholson started exercise today and tolerated it well.  30 Day ITP Review. Jeremy Nicholson has good attendance and participation in phase 2 cardiac        Comments: See ITP comments.Jeremy Pall, RN,BSN 05/11/2019 3:43 PM

## 2019-05-12 ENCOUNTER — Other Ambulatory Visit: Payer: Self-pay

## 2019-05-12 ENCOUNTER — Encounter (HOSPITAL_COMMUNITY)
Admission: RE | Admit: 2019-05-12 | Discharge: 2019-05-12 | Disposition: A | Discharge status: Home / Self Care | Payer: No Typology Code available for payment source | Source: Ambulatory Visit | Attending: Cardiology | Admitting: Cardiology

## 2019-05-12 DIAGNOSIS — Z951 Presence of aortocoronary bypass graft: Secondary | ICD-10-CM | POA: Diagnosis not present

## 2019-05-12 DIAGNOSIS — Z952 Presence of prosthetic heart valve: Secondary | ICD-10-CM

## 2019-05-15 ENCOUNTER — Encounter (HOSPITAL_COMMUNITY)
Admission: RE | Admit: 2019-05-15 | Discharge: 2019-05-15 | Disposition: A | Discharge status: Home / Self Care | Payer: No Typology Code available for payment source | Source: Ambulatory Visit | Attending: Cardiology | Admitting: Cardiology

## 2019-05-15 ENCOUNTER — Other Ambulatory Visit: Payer: Self-pay

## 2019-05-15 DIAGNOSIS — Z951 Presence of aortocoronary bypass graft: Secondary | ICD-10-CM

## 2019-05-15 DIAGNOSIS — Z952 Presence of prosthetic heart valve: Secondary | ICD-10-CM

## 2019-05-17 ENCOUNTER — Other Ambulatory Visit: Payer: Self-pay

## 2019-05-17 ENCOUNTER — Encounter (HOSPITAL_COMMUNITY)
Admission: RE | Admit: 2019-05-17 | Discharge: 2019-05-17 | Disposition: A | Discharge status: Home / Self Care | Payer: No Typology Code available for payment source | Source: Ambulatory Visit | Attending: Cardiology | Admitting: Cardiology

## 2019-05-17 DIAGNOSIS — Z951 Presence of aortocoronary bypass graft: Secondary | ICD-10-CM | POA: Diagnosis not present

## 2019-05-17 DIAGNOSIS — Z952 Presence of prosthetic heart valve: Secondary | ICD-10-CM

## 2019-05-19 ENCOUNTER — Encounter (HOSPITAL_COMMUNITY)
Admission: RE | Admit: 2019-05-19 | Discharge: 2019-05-19 | Disposition: A | Discharge status: Home / Self Care | Payer: No Typology Code available for payment source | Source: Ambulatory Visit | Attending: Cardiology | Admitting: Cardiology

## 2019-05-19 ENCOUNTER — Other Ambulatory Visit: Payer: Self-pay

## 2019-05-19 DIAGNOSIS — Z951 Presence of aortocoronary bypass graft: Secondary | ICD-10-CM

## 2019-05-19 DIAGNOSIS — Z952 Presence of prosthetic heart valve: Secondary | ICD-10-CM

## 2019-05-22 ENCOUNTER — Encounter (HOSPITAL_COMMUNITY)
Admission: RE | Admit: 2019-05-22 | Discharge: 2019-05-22 | Disposition: A | Discharge status: Home / Self Care | Payer: No Typology Code available for payment source | Source: Ambulatory Visit | Attending: Cardiology | Admitting: Cardiology

## 2019-05-22 ENCOUNTER — Other Ambulatory Visit: Payer: Self-pay

## 2019-05-22 DIAGNOSIS — Z951 Presence of aortocoronary bypass graft: Secondary | ICD-10-CM

## 2019-05-22 DIAGNOSIS — Z952 Presence of prosthetic heart valve: Secondary | ICD-10-CM

## 2019-05-22 LAB — GLUCOSE, CAPILLARY: Glucose-Capillary: 119 mg/dL — ABNORMAL HIGH (ref 70–99)

## 2019-05-24 ENCOUNTER — Other Ambulatory Visit: Payer: Self-pay

## 2019-05-24 ENCOUNTER — Encounter (HOSPITAL_COMMUNITY)
Admission: RE | Admit: 2019-05-24 | Discharge: 2019-05-24 | Disposition: A | Discharge status: Home / Self Care | Payer: No Typology Code available for payment source | Source: Ambulatory Visit | Attending: Cardiology | Admitting: Cardiology

## 2019-05-24 DIAGNOSIS — Z951 Presence of aortocoronary bypass graft: Secondary | ICD-10-CM | POA: Diagnosis not present

## 2019-05-24 DIAGNOSIS — Z952 Presence of prosthetic heart valve: Secondary | ICD-10-CM

## 2019-05-24 NOTE — Progress Notes (Signed)
Pt is interested in participating in Virtual Cardiac and Pulmonary Rehab. Pt advised that Virtual Cardiac and Pulmonary Rehab is provided at no cost to the patient.  Checklist:  1. Pt has smart device  ie smartphone and/or ipad for downloading an app  Yes 2. Reliable internet/wifi service    Yes 3. Understands how to use their smartphone and navigate within an app.  Yes   Pt verbalized understanding and is in agreement.            Confirm Consent - In the setting of the current Covid19 crisis, you are scheduled for a phone visit with your Cardiac or Pulmonary team member.  Just as we do with many in-gym visits, in order for you to participate in this visit, we must obtain consent.  If you'd like, I can send this to your mychart (if signed up) or email for you to review.  Otherwise, I can obtain your verbal consent now.  By agreeing to a telephone visit, we'd like you to understand that the technology does not allow for your Cardiac or Pulmonary Rehab team member to perform a physical assessment, and thus may limit their ability to fully assess your ability to perform exercise programs. If your provider identifies any concerns that need to be evaluated in person, we will make arrangements to do so.  Finally, though the technology is pretty good, we cannot assure that it will always work on either your or our end and we cannot ensure that we have a secure connection.  Cardiac and Pulmonary Rehab Telehealth visits and "At Home" cardiac and pulmonary rehab are provided at no cost to you.        Are you willing to proceed?"        STAFF: Did the patient verbally acknowledge consent to telehealth visit? Document YES/NO here: Yes     Barnet Pall RN  Cardiac and Pulmonary Rehab Staff        Date 05/24/19    @ Time 1349

## 2019-05-29 ENCOUNTER — Encounter (HOSPITAL_COMMUNITY)
Admission: RE | Admit: 2019-05-29 | Discharge: 2019-05-29 | Disposition: A | Discharge status: Home / Self Care | Payer: No Typology Code available for payment source | Source: Ambulatory Visit | Attending: Cardiology | Admitting: Cardiology

## 2019-05-29 ENCOUNTER — Other Ambulatory Visit: Payer: Self-pay

## 2019-05-29 VITALS — Ht 69.5 in | Wt 178.1 lb

## 2019-05-29 DIAGNOSIS — Z951 Presence of aortocoronary bypass graft: Secondary | ICD-10-CM

## 2019-05-29 DIAGNOSIS — Z952 Presence of prosthetic heart valve: Secondary | ICD-10-CM

## 2019-05-31 ENCOUNTER — Encounter (HOSPITAL_COMMUNITY)
Admission: RE | Admit: 2019-05-31 | Discharge: 2019-05-31 | Disposition: A | Discharge status: Home / Self Care | Payer: No Typology Code available for payment source | Source: Ambulatory Visit | Attending: Cardiology | Admitting: Cardiology

## 2019-05-31 ENCOUNTER — Other Ambulatory Visit: Payer: Self-pay

## 2019-05-31 DIAGNOSIS — Z951 Presence of aortocoronary bypass graft: Secondary | ICD-10-CM | POA: Diagnosis not present

## 2019-05-31 DIAGNOSIS — Z952 Presence of prosthetic heart valve: Secondary | ICD-10-CM

## 2019-06-05 ENCOUNTER — Inpatient Hospital Stay (HOSPITAL_COMMUNITY)
Admission: RE | Admit: 2019-06-05 | Discharge: 2019-06-05 | Disposition: A | Discharge status: Home / Self Care | Payer: PPO | Source: Ambulatory Visit

## 2019-06-05 ENCOUNTER — Encounter (HOSPITAL_COMMUNITY): Payer: No Typology Code available for payment source

## 2019-06-05 ENCOUNTER — Other Ambulatory Visit: Payer: Self-pay

## 2019-06-05 DIAGNOSIS — I10 Essential (primary) hypertension: Secondary | ICD-10-CM | POA: Insufficient documentation

## 2019-06-05 DIAGNOSIS — Z952 Presence of prosthetic heart valve: Secondary | ICD-10-CM | POA: Insufficient documentation

## 2019-06-05 DIAGNOSIS — Z7984 Long term (current) use of oral hypoglycemic drugs: Secondary | ICD-10-CM | POA: Insufficient documentation

## 2019-06-05 DIAGNOSIS — Z951 Presence of aortocoronary bypass graft: Secondary | ICD-10-CM | POA: Insufficient documentation

## 2019-06-05 DIAGNOSIS — I252 Old myocardial infarction: Secondary | ICD-10-CM | POA: Insufficient documentation

## 2019-06-05 DIAGNOSIS — Z7982 Long term (current) use of aspirin: Secondary | ICD-10-CM | POA: Insufficient documentation

## 2019-06-05 DIAGNOSIS — E119 Type 2 diabetes mellitus without complications: Secondary | ICD-10-CM | POA: Insufficient documentation

## 2019-06-05 DIAGNOSIS — Z79899 Other long term (current) drug therapy: Secondary | ICD-10-CM | POA: Insufficient documentation

## 2019-06-05 NOTE — Progress Notes (Signed)
Nutrition Note: Virtual Visit  Week 1 Spoke with pt for virtual cardiac rehab. Pt is exercising at home most days/week for 20-30 minutes/session. Pt is reporting exercise in virtual app. Pt is not experiencing adverse symptoms.   Diet recall reviewed. Pt denies any problems with medications.  CBGs WNL. Fasting CBG tody was 90 mg/dl. Pt able to identify carbohydrate intake from the evening before and how that affects fasting blood sugars.  Nutrition goals reviewed. Activity: add protein to breakfast and keep breakfast carbohydrates 60-75 g. Handouts: protein ideas Reinforced education and goals with summary email.  Will continue to monitor pt with weekly phone calls for virtual cardiac rehab.   [name], MS, RDN, LDN

## 2019-06-05 NOTE — Progress Notes (Signed)
Cardiac Individual Treatment Plan  Patient Details  Name: Jeremy Nicholson MRN: [id number] Date of Birth: 07/03/1945 Referring Provider:     CARDIAC REHAB PHASE II ORIENTATION from 04/20/2019 in [place]  Referring Provider  Fransico Him MD       Initial Encounter Date:    CARDIAC REHAB [facility] from 04/20/2019 in [place]  Date  04/20/19      Visit Diagnosis: S/P CABG x 2  S/P AVR (aortic valve replacement)  Patient's Home Medications on Admission:  Current Outpatient Medications:  .  acetaminophen (TYLENOL) 500 MG tablet, Take 1,000 mg by mouth every 6 (six) hours as needed for moderate pain. , Disp: , Rfl:  .  amLODipine (NORVASC) 10 MG tablet, Take 10 mg by mouth every morning. , Disp: , Rfl:  .  aspirin EC 81 MG tablet, Take by mouth., Disp: , Rfl:  .  atorvastatin (LIPITOR) 40 MG tablet, Take 40 mg by mouth every evening. , Disp: , Rfl: 2 .  betamethasone, augmented, (DIPROLENE) 0.05 % lotion, betamethasone, augmented 0.05 % lotion, Disp: , Rfl:  .  bisacodyl (BISACODYL) 5 MG EC tablet, Take 5 mg by mouth daily as needed for moderate constipation., Disp: , Rfl:  .  Brinzolamide-Brimonidine 1-0.2 % SUSP, Place 1 drop into both eyes 2 (two) times daily. , Disp: , Rfl:  .  cephALEXin (KEFLEX) 500 MG capsule, Take 1 capsule (500 mg total) by mouth 2 (two) times daily. X 3 days to prevent post-op infection (Patient not taking: Reported on 04/17/2019), Disp: 6 capsule, Rfl: 0 .  Cholecalciferol 1000 units tablet, Take 1,000 Units by mouth every morning. , Disp: , Rfl:  .  diclofenac sodium (VOLTAREN) 1 % GEL, Apply 2 g topically daily as needed (knee pain)., Disp: , Rfl:  .  finasteride (PROSCAR) 5 MG tablet, Take 5 mg by mouth every morning. , Disp: , Rfl: 3 .  furosemide (LASIX) 40 MG tablet, Take 40 mg by mouth every morning., Disp: , Rfl:  .  insulin glargine (LANTUS) 100 UNIT/ML injection, Inject 8  Units into the skin at bedtime. , Disp: , Rfl:  .  ipratropium (ATROVENT) 0.06 % nasal spray, Place into the nose., Disp: , Rfl:  .  latanoprost (XALATAN) 0.005 % ophthalmic solution, Place 1 drop into both eyes at bedtime. , Disp: , Rfl:  .  lisinopril (ZESTRIL) 5 MG tablet, Take by mouth., Disp: , Rfl:  .  metFORMIN (GLUCOPHAGE) 500 MG tablet, Take 1,000 mg by mouth 2 (two) times daily with a meal. , Disp: , Rfl:  .  metoprolol tartrate (LOPRESSOR) 50 MG tablet, Take 50 mg by mouth 2 (two) times daily. , Disp: , Rfl:  .  niacin (SLO-NIACIN) 500 MG tablet, Take 500 mg by mouth every morning. , Disp: , Rfl:  .  oxyCODONE (OXY IR/ROXICODONE) 5 MG immediate release tablet, , Disp: , Rfl:  .  potassium chloride SA (K-DUR,KLOR-CON) 20 MEQ tablet, Take 20 mEq by mouth every morning., Disp: , Rfl:  .  Semaglutide,0.25 or 0.5MG/DOS, 2 MG/1.5ML SOPN, Inject 0.5 mg into the skin once a week. , Disp: , Rfl:  .  sildenafil (VIAGRA) 100 MG tablet, TAKE ONE TABLET BY MOUTH APPROXIMATELY ONE HOUR BEFORE SEXUAL ACTIVITY., Disp: , Rfl:  .  tamsulosin (FLOMAX) 0.4 MG CAPS capsule, Take 0.4 mg by mouth at bedtime. , Disp: , Rfl:  .  traMADol (ULTRAM) 50 MG tablet, Take 1-2  tablets (50-100 mg total) by mouth every 6 (six) hours as needed for moderate pain or severe pain. Post-operatively (Patient not taking: Reported on 04/17/2019), Disp: 20 tablet, Rfl: 0  Past Medical History: Past Medical History:  Diagnosis Date  . Aortic valve stenosis, moderate    per echo 06-02-2017 (in epic)  ef 55-60%,  possible bicuspid AV,  severeely thickened and calcified with severely restricted leaflet opening,  valve area 1.17cm^2, mean gradient 46mHg, peak gradient 570mg,  moderate AR  . Arthritis    oa  . BPH associated with nocturia   . Complication of anesthesia    due to heart murmur  mod to severe aortic stenosis  . Full dentures   . Glaucoma, both eyes   . Heart murmur, systolic   . Hiatal hernia   . Hypertension    . Left ureteral stone   . [place] [facility]   [facility] [place] [facility]  . SIRS (systemic inflammatory response syndrome) [place]   hospital admission--  unknown etiology  . Type 2 diabetes mellitus ([facility]  . Wears glasses     Tobacco Use: Social History   Tobacco Use  Smoking Status Former Smoker  . Years: 30.00  . Types: Cigarettes  . Quit date: 01/28/1995  . Years since quitting: 24.3  Smokeless Tobacco Never Used    Labs: Recent Review Flowsheet Data    There is no flowsheet data to display.      Capillary Blood Glucose: Lab Results  Component Value Date   GLUCAP 119 (H) 05/22/2019   GLUCAP 207 (H) 05/08/2019   GLUCAP 151 (H) 05/03/2019   GLUCAP 151 (H) 05/01/2019   GLUCAP 122 (H) 04/26/2019     Exercise Target Goals: Exercise Program Goal: Individual exercise prescription set using results from initial 6 min walk test and THRR while considering  patient's activity barriers and safety.   Exercise Prescription Goal: Starting with aerobic activity 30 plus minutes a day, 3 days per week for initial exercise prescription. Provide home exercise prescription and guidelines that participant acknowledges understanding prior to discharge.  Activity Barriers & Risk Stratification: Activity Barriers & Cardiac Risk Stratification - 04/20/19 1035      Activity Barriers & Cardiac Risk Stratification   Activity Barriers  None    Cardiac Risk Stratification  High       6 Minute Walk: 6 Minute Walk    Row Name 04/20/19 1034         6 Minute Walk   Phase  Initial     Distance  1134 feet     Walk Time  6 minutes     # of Rest Breaks  0     MPH  2.15     METS  2.76     RPE  11     Perceived Dyspnea   0     VO2 Peak  9.7     Symptoms  No     Resting HR  96 bpm     Resting BP  118/70     Resting Oxygen Saturation   100 %     Exercise Oxygen Saturation  during 6 min walk  98 %     Max Ex. HR  98 bpm     Max Ex. BP   148/70     2 Minute Post BP  132/72        Oxygen Initial Assessment:   Oxygen Re-Evaluation:  Oxygen Discharge (Final Oxygen Re-Evaluation):   Initial Exercise Prescription: Initial Exercise Prescription - 04/26/19 1000      Date of Initial Exercise RX and Referring Provider   Date  --    Referring Provider  --    Expected Discharge Date  --      Recumbant Bike   Level  --    Watts  --    Minutes  --    METs  --      NuStep   Level  --    SPM  --    Minutes  --    METs  --      Prescription Details   Frequency (times per week)  --      Intensity   THRR 40-80% of Max Heartrate  --    Ratings of Perceived Exertion  --    Perceived Dyspnea  --      Progression   Progression  --      Resistance Training   Training Prescription  --       Perform Capillary Blood Glucose checks as needed.  Exercise Prescription Changes: Exercise Prescription Changes    Row Name 04/26/19 1000 05/05/19 1100 05/22/19 0915 05/31/19 0915       Response to Exercise   Blood Pressure (Admit)  148/72  100/60  112/64  124/64    Blood Pressure (Exercise)  154/78  142/78  132/72  140/76    Blood Pressure (Exit)  142/78  122/78  112/60  122/72    Heart Rate (Admit)  85 bpm  81 bpm  88 bpm  93 bpm    Heart Rate (Exercise)  101 bpm  103 bpm  119 bpm  120 bpm    Heart Rate (Exit)  89 bpm  88 bpm  98 bpm  97 bpm    Rating of Perceived Exertion (Exercise)  12  12  11  11     Symptoms  None  None  None  None    Comments  Pt first day of exercise.   --  --  Pt last day for in person rehab. Transitioning to virtual CR program.     Duration  Continue with 30 min of aerobic exercise without signs/symptoms of physical distress.  Continue with 30 min of aerobic exercise without signs/symptoms of physical distress.  Continue with 30 min of aerobic exercise without signs/symptoms of physical distress.  Continue with 30 min of aerobic exercise without signs/symptoms of physical distress.     Intensity  THRR unchanged  THRR unchanged  THRR unchanged  THRR unchanged      Progression   Progression  Continue to progress workloads to maintain intensity without signs/symptoms of physical distress.  Continue to progress workloads to maintain intensity without signs/symptoms of physical distress.  Continue to progress workloads to maintain intensity without signs/symptoms of physical distress.  Continue to progress workloads to maintain intensity without signs/symptoms of physical distress.    Average METs  1.8  2  2.4  2.6      Resistance Training   Training Prescription  --  Yes  Yes  No    Weight  --  3lbs  3lbs  --    Reps  --  10-15  10-15  --    Time  --  10 Minutes  10 Minutes  --      Interval Training   Interval Training  --  --  No  No  Recumbant Bike   Level  --  1.5  3  4     Watts  --  15  --  --    Minutes  --  15  15  15     METs  --  1.8  2.2  2.4      NuStep   Level  --  2  3  4     SPM  --  85  85  85    Minutes  --  15  15  15     METs  --  2.2  2.6  2.9       Exercise Comments: Exercise Comments    Row Name 04/26/19 1021 05/09/19 1402 06/01/19 1316       Exercise Comments  Pt first day of exercise in CR program. Pt tolerated exercise well.  Reviewed METs and goals with Pt. Pt is progressing well.  Pt is progressing well and wants to participate in virtual CR program. Pt is active in the app and is going to start logging his exercise sessions.        Exercise Goals and Review: Exercise Goals    Row Name 04/20/19 1036             Exercise Goals   Increase Physical Activity  Yes       Intervention  Provide advice, education, support and counseling about physical activity/exercise needs.;Develop an individualized exercise prescription for aerobic and resistive training based on initial evaluation findings, risk stratification, comorbidities and participant's personal goals.       Expected Outcomes  Short Term: Attend rehab on a regular basis to  increase amount of physical activity.;Long Term: Add in home exercise to make exercise part of routine and to increase amount of physical activity.;Long Term: Exercising regularly at least 3-5 days a week.       Increase Strength and Stamina  Yes       Intervention  Provide advice, education, support and counseling about physical activity/exercise needs.;Develop an individualized exercise prescription for aerobic and resistive training based on initial evaluation findings, risk stratification, comorbidities and participant's personal goals.       Expected Outcomes  Short Term: Increase workloads from initial exercise prescription for resistance, speed, and METs.;Short Term: Perform resistance training exercises routinely during rehab and add in resistance training at home;Long Term: Improve cardiorespiratory fitness, muscular endurance and strength as measured by increased METs and functional capacity (6MWT)       Able to understand and use rate of perceived exertion (RPE) scale  Yes       Intervention  Provide education and explanation on how to use RPE scale       Expected Outcomes  Long Term:  Able to use RPE to guide intensity level when exercising independently;Short Term: Able to use RPE daily in rehab to express subjective intensity level       Knowledge and understanding of Target Heart Rate Range (THRR)  Yes       Intervention  Provide education and explanation of THRR including how the numbers were predicted and where they are located for reference       Expected Outcomes  Long Term: Able to use THRR to govern intensity when exercising independently;Short Term: Able to state/look up THRR;Short Term: Able to use daily as guideline for intensity in rehab       Able to check pulse independently  Yes       Intervention  Review the importance of being  able to check your own pulse for safety during independent exercise;Provide education and demonstration on how to check pulse in carotid and radial  arteries.       Expected Outcomes  Short Term: Able to explain why pulse checking is important during independent exercise;Long Term: Able to check pulse independently and accurately       Understanding of Exercise Prescription  Yes       Intervention  Provide education, explanation, and written materials on patient's individual exercise prescription       Expected Outcomes  Short Term: Able to explain program exercise prescription;Long Term: Able to explain home exercise prescription to exercise independently          Exercise Goals Re-Evaluation : Exercise Goals Re-Evaluation    Row Name 04/26/19 1020 05/09/19 1401 06/01/19 1314         Exercise Goal Re-Evaluation   Exercise Goals Review  Increase Physical Activity;Increase Strength and Stamina;Able to understand and use rate of perceived exertion (RPE) scale;Knowledge and understanding of Target Heart Rate Range (THRR);Understanding of Exercise Prescription  Increase Physical Activity;Increase Strength and Stamina;Able to understand and use rate of perceived exertion (RPE) scale;Knowledge and understanding of Target Heart Rate Range (THRR);Able to check pulse independently;Understanding of Exercise Prescription  Increase Physical Activity;Able to understand and use rate of perceived exertion (RPE) scale;Increase Strength and Stamina;Knowledge and understanding of Target Heart Rate Range (THRR);Able to check pulse independently;Understanding of Exercise Prescription     Comments  Pt first day of exercise in CR program. Pt tolerated exercise well and understand THRR, RPE scale, cool down stretches, and exercise Rx.  Reviewed METs and goals with Pt. Pt is porgressing well and has a MET level of 2.0. Pt stated his goals was to start walking 30-45 minutes 5-7 days per week in addition to CR program.  Pt is progressing well and has a MET level of 2.6. Pt completed 14 sessions. Pt was offered virtual CR program due to department closure for Day. Pt  wants to participate in virutal CR program and is now active on the app. Pt will start logging his exercise sessions on the app. Pt was encouraged to exercise 5-7 days for 30-45 minutes each day.     Expected Outcomes  Will continue to monitor and progress Pt as tolerated.  Will continue to monitor and progress Pt as tolerated.  Pt will continue exercising at home.         Discharge Exercise Prescription (Final Exercise Prescription Changes): Exercise Prescription Changes - 05/31/19 0915      Response to Exercise   Blood Pressure (Admit)  124/64    Blood Pressure (Exercise)  140/76    Blood Pressure (Exit)  122/72    Heart Rate (Admit)  93 bpm    Heart Rate (Exercise)  120 bpm    Heart Rate (Exit)  97 bpm    Rating of Perceived Exertion (Exercise)  11    Symptoms  None    Comments  Pt last day for in person rehab. Transitioning to virtual CR program.     Duration  Continue with 30 min of aerobic exercise without signs/symptoms of physical distress.    Intensity  THRR unchanged      Progression   Progression  Continue to progress workloads to maintain intensity without signs/symptoms of physical distress.    Average METs  2.6      Resistance Training   Training Prescription  No      Interval Training  Interval Training  No      Recumbant Bike   Level  4    Minutes  15    METs  2.4      NuStep   Level  4    SPM  85    Minutes  15    METs  2.9       Nutrition:  Target Goals: Understanding of nutrition guidelines, daily intake of sodium <1557m, cholesterol <2013m calories 30% from fat and 7% or less from saturated fats, daily to have 5 or more servings of fruits and vegetables.  Biometrics:  Post Biometrics - 04/20/19 1034       Post  Biometrics   Height  5' 9.5" (1.765 m)    Weight  79.9 kg    Waist Circumference  39 inches    Hip Circumference  36.5 inches    Waist to Hip Ratio  1.07 %    BMI (Calculated)  25.65    Triceps Skinfold  12 mm    % Body Fat   25.4 %    Grip Strength  40 kg    Flexibility  16.5 in    Single Leg Stand  2.93 seconds       Nutrition Therapy Plan and Nutrition Goals: Nutrition Therapy & Goals - 05/03/19 1506      Nutrition Therapy   Diet  Heart Healthy      Personal Nutrition Goals   Nutrition Goal  Pt to identify and limit food sources of saturated fat, trans fat, refined carbohydrates and sodium    Personal Goal #2  Improved blood glucose control as evidenced by pt's A1c trending from 7.1 toward less than 5.6.    Personal Goal #3  Pt able to name foods that affect blood glucose      Intervention Plan   Intervention  Prescribe, educate and counsel regarding individualized specific dietary modifications aiming towards targeted core components such as weight, hypertension, lipid management, diabetes, heart failure and other comorbidities.;Nutrition handout(s) given to patient.    Expected Outcomes  Short Term Goal: Understand basic principles of dietary content, such as calories, fat, sodium, cholesterol and nutrients.;Short Term Goal: A plan has been developed with personal nutrition goals set during dietitian appointment.;Long Term Goal: Adherence to prescribed nutrition plan.       Nutrition Assessments: Nutrition Assessments - 05/03/19 1511      MEDFICTS Scores   Pre Score  56       Nutrition Goals Re-Evaluation: Nutrition Goals Re-Evaluation    Row Name 05/03/19 1510 05/30/19 1512           Goals   Current Weight  176 lb (79.8 kg)  178 lb 2.1 oz (80.8 kg)      Nutrition Goal  Pt to identify and limit food sources of saturated fat, trans fat, refined carbohydrates and sodium  Pt to identify and limit food sources of saturated fat, trans fat, refined carbohydrates and sodium        Personal Goal #2 Re-Evaluation   Personal Goal #2  Improved blood glucose control as evidenced by pt's A1c trending from 7.1 toward less than 5.6.  Improved blood glucose control as evidenced by pt's A1c trending from  7.1 toward less than 5.6.        Personal Goal #3 Re-Evaluation   Personal Goal #3  Pt able to name foods that affect blood glucose  Pt able to name foods that affect blood glucose  Nutrition Goals Discharge (Final Nutrition Goals Re-Evaluation): Nutrition Goals Re-Evaluation - 05/30/19 1512      Goals   Current Weight  178 lb 2.1 oz (80.8 kg)    Nutrition Goal  Pt to identify and limit food sources of saturated fat, trans fat, refined carbohydrates and sodium      Personal Goal #2 Re-Evaluation   Personal Goal #2  Improved blood glucose control as evidenced by pt's A1c trending from 7.1 toward less than 5.6.      Personal Goal #3 Re-Evaluation   Personal Goal #3  Pt able to name foods that affect blood glucose       Psychosocial: Target Goals: Acknowledge presence or absence of significant depression and/or stress, maximize coping skills, provide positive support system. Participant is able to verbalize types and ability to use techniques and skills needed for reducing stress and depression.  Initial Review & Psychosocial Screening: Initial Psych Review & Screening - 04/20/19 0932      Initial Review   Current issues with  None Identified      Family Dynamics   Good Support System?  Yes   Pt states his wife, brother-in-law, and sister-in-law are sources of support for him.     Barriers   Psychosocial barriers to participate in program  There are no identifiable barriers or psychosocial needs.      Screening Interventions   Interventions  Encouraged to exercise       Quality of Life Scores: Quality of Life - 04/20/19 1040      Quality of Life   Select  Quality of Life      Quality of Life Scores   Health/Function Pre  27.6 %    Socioeconomic Pre  28.5 %    Psych/Spiritual Pre  30 %    Family Pre  28.8 %    GLOBAL Pre  28.46 %      Scores of 19 and below usually indicate a poorer quality of life in these areas.  A difference of  2-3 points is a  clinically meaningful difference.  A difference of 2-3 points in the total score of the Quality of Life Index has been associated with significant improvement in overall quality of life, self-image, physical symptoms, and general health in studies assessing change in quality of life.  PHQ-9: Recent Review Flowsheet Data    Depression screen [facility] 2/9 04/20/2019   Decreased Interest 0   Down, Depressed, Hopeless 0   PHQ - 2 Score 0     Interpretation of Total Score  Total Score Depression Severity:  1-4 = Minimal depression, 5-9 = Mild depression, 10-14 = Moderate depression, 15-19 = Moderately severe depression, 20-27 = Severe depression   Psychosocial Evaluation and Intervention: Psychosocial Evaluation - 04/26/19 1646      Psychosocial Evaluation & Interventions   Interventions  Encouraged to exercise with the program and follow exercise prescription    Comments  No psychosocial needs identified.  Jeremy Nicholson likes to watch football.    Expected Outcomes  Jeremy Nicholson will continue to maintain a positive outlook with good coping skills.    Continue Psychosocial Services   No Follow up required       Psychosocial Re-Evaluation: Psychosocial Re-Evaluation    [place] Name 05/11/19 1537 05/25/19 1044           Psychosocial Re-Evaluation   Current issues with  None Identified  None Identified      Interventions  Encouraged to attend Cardiac Rehabilitation for the  exercise  Encouraged to attend Cardiac Rehabilitation for the exercise      Continue Psychosocial Services   No Follow up required  No Follow up required         Psychosocial Discharge (Final Psychosocial Re-Evaluation): Psychosocial Re-Evaluation - 05/25/19 1044      Psychosocial Re-Evaluation   Current issues with  None Identified    Interventions  Encouraged to attend Cardiac Rehabilitation for the exercise    Continue Psychosocial Services   No Follow up required       Vocational Rehabilitation: Provide vocational rehab  assistance to qualifying candidates.   Vocational Rehab Evaluation & Intervention: Vocational Rehab - 04/20/19 0934      Initial Vocational Rehab Evaluation & Intervention   Assessment shows need for Vocational Rehabilitation  No       Education: Education Goals: Education classes will be provided on a weekly basis, covering required topics. Participant will state understanding/return demonstration of topics presented.  Learning Barriers/Preferences: Learning Barriers/Preferences - 04/20/19 1036      Learning Barriers/Preferences   Learning Barriers  None    Learning Preferences  Written Material;Skilled Demonstration;Individual Instruction       Education Topics: Hypertension, Hypertension Reduction -Define heart disease and high blood pressure. Discus how high blood pressure affects the body and ways to reduce high blood pressure.   Exercise and Your Heart -Discuss why it is important to exercise, the FITT principles of exercise, normal and abnormal responses to exercise, and how to exercise safely.   Angina -Discuss definition of angina, causes of angina, treatment of angina, and how to decrease risk of having angina.   Cardiac Medications -Review what the following cardiac medications are used for, how they affect the body, and side effects that may occur when taking the medications.  Medications include Aspirin, Beta blockers, calcium channel blockers, ACE Inhibitors, angiotensin receptor blockers, diuretics, digoxin, and antihyperlipidemics.   Congestive Heart Failure -Discuss the definition of CHF, how to live with CHF, the signs and symptoms of CHF, and how keep track of weight and sodium intake.   Heart Disease and Intimacy -Discus the effect sexual activity has on the heart, how changes occur during intimacy as we age, and safety during sexual activity.   Smoking Cessation / COPD -Discuss different methods to quit smoking, the health benefits of quitting  smoking, and the definition of COPD.   Nutrition I: Fats -Discuss the types of cholesterol, what cholesterol does to the heart, and how cholesterol levels can be controlled.   Nutrition II: Labels -Discuss the different components of food labels and how to read food label   Heart Parts/Heart Disease and PAD -Discuss the anatomy of the heart, the pathway of blood circulation through the heart, and these are affected by heart disease.   Stress I: Signs and Symptoms -Discuss the causes of stress, how stress may lead to anxiety and depression, and ways to limit stress.   Stress II: Relaxation -Discuss different types of relaxation techniques to limit stress.   Warning Signs of Stroke / TIA -Discuss definition of a stroke, what the signs and symptoms are of a stroke, and how to identify when someone is having stroke.   Knowledge Questionnaire Score: Knowledge Questionnaire Score - 04/20/19 1040      Knowledge Questionnaire Score   Pre Score  20/24       Core Components/Risk Factors/Patient Goals at Admission: Personal Goals and Risk Factors at Admission - 04/20/19 1037      Core Components/Risk  Factors/Patient Goals on Admission   Diabetes  Yes    Intervention  Provide education about signs/symptoms and action to take for hypo/hyperglycemia.;Provide education about proper nutrition, including hydration, and aerobic/resistive exercise prescription along with prescribed medications to achieve blood glucose in normal ranges: Fasting glucose 65-99 mg/dL    Expected Outcomes  Short Term: Participant verbalizes understanding of the signs/symptoms and immediate care of hyper/hypoglycemia, proper foot care and importance of medication, aerobic/resistive exercise and nutrition plan for blood glucose control.;Long Term: Attainment of HbA1C < 7%.    Hypertension  Yes    Intervention  Monitor prescription use compliance.;Provide education on lifestyle modifcations including regular physical  activity/exercise, weight management, moderate sodium restriction and increased consumption of fresh fruit, vegetables, and low fat dairy, alcohol moderation, and smoking cessation.    Expected Outcomes  Long Term: Maintenance of blood pressure at goal levels.;Short Term: Continued assessment and intervention until BP is < 140/26m HG in hypertensive participants. < 130/846mHG in hypertensive participants with diabetes, heart failure or chronic kidney disease.    Lipids  Yes    Intervention  Provide education and support for participant on nutrition & aerobic/resistive exercise along with prescribed medications to achieve LDL <7068mHDL >2m17m  Expected Outcomes  Short Term: Participant states understanding of desired cholesterol values and is compliant with medications prescribed. Participant is following exercise prescription and nutrition guidelines.;Long Term: Cholesterol controlled with medications as prescribed, with individualized exercise RX and with personalized nutrition plan. Value goals: LDL < 70mg8mL > 40 mg.       Core Components/Risk Factors/Patient Goals Review:  Goals and Risk Factor Review    Row Name 04/26/19 1647 05/11/19 1537 05/11/19 1541 05/25/19 1045       Core Components/Risk Factors/Patient Goals Review   Personal Goals Review  Diabetes;Hypertension;Lipids  --  Diabetes;Hypertension;Lipids  Diabetes;Hypertension;Lipids    Review  Pt with multiple CAD RFs willing to participate in CR exercise.  CharlCorneliousd like to increase his strength, stamina, and overall health.  Pt with multiple CAD RFs willing to participate in CR exercise.  Jeremy Nicholson doing well with exercise. Jeremy Nicholson vital signs and CBG's have been stable.  --  Pt with multiple CAD RFs willing to participate in CR exercise.  CharlAmaliobeen doing well with exercise. Emari vital signs and CBG's have been stable.    Expected Outcomes  Jeremy Nicholson continue to participate in CR exercise, nutrition, and  lifestyle modification opportunities.  Jeremy Nicholson continue to participate in CR exercise, nutrition, and lifestyle modification opportunities.  --  Jeremy Nicholson continue to participate in CR exercise, nutrition, and lifestyle modification opportunities. Jeremy Nicholson plans to transitions to virtual cardiac rehab when in person cardiac rehab is placed on hold due to COVID 19 [employer]       Core Components/Risk Factors/Patient Goals at Discharge (Final Review):  Goals and Risk Factor Review - 05/25/19 1045      Core Components/Risk Factors/Patient Goals Review   Personal Goals Review  Diabetes;Hypertension;Lipids    Review  Pt with multiple CAD RFs willing to participate in CR exercise.  Jeremy Nicholson doing well with exercise. Khylin vital signs and CBG's have been stable.    Expected Outcomes  CharlDimetrius continue to participate in CR exercise, nutrition, and lifestyle modification opportunities. [place] plans to transitions to virtual cardiac rehab when in person cardiac rehab is placed on hold due to COVID 19 [employer]       ITP Comments: ITP Comments  [place] Name 04/20/19 0857 04/26/19 1645 05/11/19 1535 06/05/19 1408     ITP Comments  Dr. Fransico Him, Medical Director  Juanda Crumble started exercise today and tolerated it well.  30 Day ITP Review. Odas has good attendance and participation in phase 2 cardiac  30 Day ITP Review. Hershell did well with exercise at phase 2 cardiac rehab. Phase 2 cardiac is currently on hold due to the COVID 19 pandemic.       Comments: See ITP comments.Barnet Pall, RN,BSN 06/05/2019 2:28 PM

## 2019-06-07 ENCOUNTER — Encounter (HOSPITAL_COMMUNITY): Payer: No Typology Code available for payment source

## 2019-06-09 ENCOUNTER — Encounter (HOSPITAL_COMMUNITY): Payer: No Typology Code available for payment source

## 2019-06-12 ENCOUNTER — Other Ambulatory Visit: Payer: Self-pay

## 2019-06-12 ENCOUNTER — Inpatient Hospital Stay (HOSPITAL_COMMUNITY)
Admission: RE | Admit: 2019-06-12 | Discharge: 2019-06-12 | Disposition: A | Discharge status: Home / Self Care | Payer: PPO | Source: Ambulatory Visit

## 2019-06-12 ENCOUNTER — Encounter (HOSPITAL_COMMUNITY): Payer: No Typology Code available for payment source

## 2019-06-12 DIAGNOSIS — E785 Hyperlipidemia, unspecified: Secondary | ICD-10-CM | POA: Diagnosis not present

## 2019-06-12 DIAGNOSIS — E1121 Type 2 diabetes mellitus with diabetic nephropathy: Secondary | ICD-10-CM | POA: Diagnosis not present

## 2019-06-12 DIAGNOSIS — Z794 Long term (current) use of insulin: Secondary | ICD-10-CM | POA: Diagnosis not present

## 2019-06-12 DIAGNOSIS — N400 Enlarged prostate without lower urinary tract symptoms: Secondary | ICD-10-CM | POA: Diagnosis not present

## 2019-06-12 DIAGNOSIS — E1122 Type 2 diabetes mellitus with diabetic chronic kidney disease: Secondary | ICD-10-CM | POA: Diagnosis not present

## 2019-06-12 DIAGNOSIS — E1159 Type 2 diabetes mellitus with other circulatory complications: Secondary | ICD-10-CM | POA: Diagnosis not present

## 2019-06-12 DIAGNOSIS — I10 Essential (primary) hypertension: Secondary | ICD-10-CM | POA: Diagnosis not present

## 2019-06-12 DIAGNOSIS — H409 Unspecified glaucoma: Secondary | ICD-10-CM | POA: Diagnosis not present

## 2019-06-12 NOTE — Progress Notes (Signed)
Nutrition Note: Virtual Visit  Week 2 Spoke with pt for virtual cardiac rehab. Pt is exercising at home about 5-7  days/week for 30 minutes/session. Pt is reporting exercise in virtual app.   Diet recall reviewed. Pt denies any problems with medications.  Nutrition goals reviewed. Pt has met goals from last week. He incorporate protein to his breakfast and limited carbs to 2 pieces of toast for post prandial blood sugars <160 mg/dl. His fasting CBGs continue between 90-100 mg/dl. Pt does not report lows.  Pt asked to continue receiving calls for virtual rehab for accountability. He has made all appropriate changes and has adequate knowledge for diabetes management and disease prevention. Reinforced education and goals with summary email.  Will continue to monitor pt with weekly phone calls for virtual cardiac rehab.   Jeremy Offer, MS, RDN, LDN

## 2019-06-14 ENCOUNTER — Telehealth (HOSPITAL_COMMUNITY): Payer: Self-pay | Admitting: *Deleted

## 2019-06-14 ENCOUNTER — Encounter (HOSPITAL_COMMUNITY): Payer: No Typology Code available for payment source

## 2019-06-16 ENCOUNTER — Encounter (HOSPITAL_COMMUNITY): Payer: No Typology Code available for payment source

## 2019-06-16 ENCOUNTER — Telehealth (HOSPITAL_COMMUNITY): Payer: Self-pay | Admitting: *Deleted

## 2019-06-16 ENCOUNTER — Encounter (HOSPITAL_COMMUNITY)
Admission: RE | Admit: 2019-06-16 | Discharge: 2019-06-16 | Disposition: A | Discharge status: Home / Self Care | Payer: No Typology Code available for payment source | Source: Ambulatory Visit | Attending: Cardiology | Admitting: Cardiology

## 2019-06-16 ENCOUNTER — Other Ambulatory Visit: Payer: Self-pay

## 2019-06-16 NOTE — Telephone Encounter (Signed)
Spoke to Jeremy Nicholson regarding home exercise and using the virtual cardiac rehab APP. Jeremy Nicholson says he is enjoying using the virtual cardiac rehab APP. Jeremy Nicholson says he is riding his stationary bike every other day and walking up and down the stair 4 times a day. Jeremy Nicholson is also documenting his vital signs on the virtual APP. Patient also reports that he following safety measures to prevent the transmission and spread of COVID 19. Barnet Pall, RN,BSN 06/16/2019 10:35 AM

## 2019-06-19 ENCOUNTER — Other Ambulatory Visit: Payer: Self-pay

## 2019-06-19 ENCOUNTER — Encounter (HOSPITAL_COMMUNITY)
Admission: RE | Admit: 2019-06-19 | Discharge: 2019-06-19 | Disposition: A | Discharge status: Home / Self Care | Payer: No Typology Code available for payment source | Source: Ambulatory Visit | Attending: Cardiology | Admitting: Cardiology

## 2019-06-19 NOTE — Progress Notes (Signed)
Nutrition Note: Virtual Visit  Week 3 Spoke with pt for virtual cardiac rehab. Pt is exercising at home about 5 days/week. Pt is reporting exercise in virtual app. Pt denies symptoms.   Diet recall reviewed. Pt doing well with portion sizes, healthy plate, and blood glucose monitoring. He reports having collards, [place] loaf, and mashed potatoes last night and this morning his fasting CBG was 120 mg/dl. He had pizza last weekend and reports fasting CBG next am 90 mg/dl.  Pt denies any problems with medications.  Nutrition goals reviewed. Handouts: label reading and breakfast ideas Will continue to monitor pt with weekly phone calls for virtual cardiac rehab.   [name], MS, RDN, LDN

## 2019-06-20 ENCOUNTER — Ambulatory Visit: Payer: PPO | Attending: Internal Medicine

## 2019-06-20 DIAGNOSIS — Z23 Encounter for immunization: Secondary | ICD-10-CM

## 2019-06-20 NOTE — Progress Notes (Signed)
   Covid-19 Vaccination Clinic  Name:  Jeremy Nicholson    MRN: [id number] DOB: 04/29/1946  06/20/2019  Mr. Jeremy Nicholson was observed post Covid-19 immunization for 15 minutes without incidence. He was provided with Vaccine Information Sheet and instruction to access the V-Safe system.   Mr. Jeremy Nicholson was instructed to call 911 with any severe reactions post vaccine: Marland Kitchen Difficulty breathing  . Swelling of your face and throat  . A fast heartbeat  . A bad rash all over your body  . Dizziness and weakness    Immunizations Administered    Name Date Dose VIS Date Route   Pfizer COVID-19 Vaccine 06/20/2019  1:31 PM 0.3 mL 05/12/2019 Intramuscular   Manufacturer: [place]   Lot: [id number]   [place]: [id number]

## 2019-06-26 ENCOUNTER — Inpatient Hospital Stay (HOSPITAL_COMMUNITY)
Admission: RE | Admit: 2019-06-26 | Discharge: 2019-06-26 | Disposition: A | Discharge status: Home / Self Care | Payer: PPO | Source: Ambulatory Visit

## 2019-06-26 ENCOUNTER — Other Ambulatory Visit: Payer: Self-pay

## 2019-06-26 NOTE — Progress Notes (Signed)
Nutrition Note: Virtual Visit   Spoke with pt for virtual cardiac rehab. Pt is  exercising at home most days. Pt is reporting exercise in virtual app. Pt denies symptoms.   Diet recall reviewed. Pt denies any problems with medications.  Nutrition goals reviewed. Activity: continue checking fasting CBGs and exercising daily Reviewed CBGs from last week. Will continue to monitor pt with weekly phone calls for virtual cardiac rehab.   [name], MS, RDN, LDN

## 2019-06-28 ENCOUNTER — Encounter (HOSPITAL_COMMUNITY)
Admission: RE | Admit: 2019-06-28 | Discharge: 2019-06-28 | Disposition: A | Discharge status: Home / Self Care | Payer: No Typology Code available for payment source | Source: Ambulatory Visit | Attending: Cardiology | Admitting: Cardiology

## 2019-06-28 ENCOUNTER — Telehealth (HOSPITAL_COMMUNITY): Payer: Self-pay | Admitting: *Deleted

## 2019-06-28 ENCOUNTER — Other Ambulatory Visit: Payer: Self-pay

## 2019-06-28 NOTE — Telephone Encounter (Signed)
Spoke to [place] regarding virtual cardiac rehab better hard APPs. Grigor is exercising at least 4 days a week. Drayton vital signs have been stable. Lukus will continue to use the virtual cardiac rehab and is interested in resuming in person rehab when the program re opens.Barnet Pall, RN,BSN 06/28/2019 10:47 AM

## 2019-07-03 ENCOUNTER — Other Ambulatory Visit: Payer: Self-pay

## 2019-07-03 ENCOUNTER — Encounter (HOSPITAL_COMMUNITY)
Admission: RE | Admit: 2019-07-03 | Discharge: 2019-07-03 | Disposition: A | Discharge status: Home / Self Care | Payer: No Typology Code available for payment source | Source: Ambulatory Visit | Attending: Cardiology | Admitting: Cardiology

## 2019-07-03 DIAGNOSIS — Z79899 Other long term (current) drug therapy: Secondary | ICD-10-CM | POA: Insufficient documentation

## 2019-07-03 DIAGNOSIS — Z952 Presence of prosthetic heart valve: Secondary | ICD-10-CM | POA: Insufficient documentation

## 2019-07-03 DIAGNOSIS — Z7982 Long term (current) use of aspirin: Secondary | ICD-10-CM | POA: Insufficient documentation

## 2019-07-03 DIAGNOSIS — I252 Old myocardial infarction: Secondary | ICD-10-CM | POA: Insufficient documentation

## 2019-07-03 DIAGNOSIS — Z951 Presence of aortocoronary bypass graft: Secondary | ICD-10-CM | POA: Insufficient documentation

## 2019-07-03 DIAGNOSIS — E119 Type 2 diabetes mellitus without complications: Secondary | ICD-10-CM | POA: Insufficient documentation

## 2019-07-03 DIAGNOSIS — I10 Essential (primary) hypertension: Secondary | ICD-10-CM | POA: Insufficient documentation

## 2019-07-03 DIAGNOSIS — Z7984 Long term (current) use of oral hypoglycemic drugs: Secondary | ICD-10-CM | POA: Insufficient documentation

## 2019-07-03 NOTE — Progress Notes (Signed)
Nutrition Note: Virtual Visit  Spoke with pt for virtual cardiac rehab. Pt is exercising at home about 5 days/week.  Diet recall reviewed. Nutrition goals reviewed. Activity: continue checking CBGs, maintain healthy habits that have been formed.  Will continue to monitor via [employer] app. Jeremy Nicholson has made all appropriate diet changes and continues to exhibit excellent CBG control.   [name], MS, RDN, LDN

## 2019-07-06 ENCOUNTER — Telehealth (HOSPITAL_COMMUNITY): Payer: Self-pay

## 2019-07-06 NOTE — Telephone Encounter (Signed)
Rescheduled pt for remaining 10 cardiac rehab session.. Pt will come in on 07/17/2019@9 :15am and will graduate on 08/07/2019.

## 2019-07-06 NOTE — Addendum Note (Signed)
Encounter addended by: Sol Passer on: 07/06/2019 2:53 PM  Actions taken: Flowsheet data copied forward, Flowsheet accepted

## 2019-07-06 NOTE — Telephone Encounter (Signed)
Pt insurance is active and benefits verified through [employer] adv Co-pay $15, DED 0/0 met, out of pocket $3,400/0 met, co-insurance 0%. no pre-authorization required, REF# [id number]  Will contact patient to see if he is interested in the Cardiac Rehab Program. If interested, patient will need to complete follow up appt. Once completed, patient will be contacted for scheduling upon review by the RN Navigator.

## 2019-07-07 DIAGNOSIS — J309 Allergic rhinitis, unspecified: Secondary | ICD-10-CM | POA: Diagnosis not present

## 2019-07-11 ENCOUNTER — Ambulatory Visit: Payer: PPO | Attending: Internal Medicine

## 2019-07-11 DIAGNOSIS — Z23 Encounter for immunization: Secondary | ICD-10-CM

## 2019-07-11 NOTE — Progress Notes (Signed)
   Covid-19 Vaccination Clinic  Name:  Jeremy Nicholson    MRN: [id number] DOB: Aug 16, 1945  07/11/2019  Mr. Jeremy Nicholson was observed post Covid-19 immunization for 15 minutes without incidence. He was provided with Vaccine Information Sheet and instruction to access the V-Safe system.   Mr. Jeremy Nicholson was instructed to call 911 with any severe reactions post vaccine: Marland Kitchen Difficulty breathing  . Swelling of your face and throat  . A fast heartbeat  . A bad rash all over your body  . Dizziness and weakness    Immunizations Administered    Name Date Dose VIS Date Route   Pfizer COVID-19 Vaccine 07/11/2019  2:44 PM 0.3 mL 05/12/2019 Intramuscular   Manufacturer: [place]   Lot: [id number]   [place]: [id number]

## 2019-07-12 ENCOUNTER — Encounter (HOSPITAL_COMMUNITY): Payer: Self-pay | Admitting: *Deleted

## 2019-07-12 DIAGNOSIS — Z952 Presence of prosthetic heart valve: Secondary | ICD-10-CM

## 2019-07-12 DIAGNOSIS — Z951 Presence of aortocoronary bypass graft: Secondary | ICD-10-CM

## 2019-07-12 NOTE — Progress Notes (Signed)
Cardiac Individual Treatment Plan  Patient Details  Name: Jeremy Nicholson MRN: [id number] Date of Birth: 1946/04/30 Referring Provider:     CARDIAC REHAB PHASE II ORIENTATION from 04/20/2019 in [place]  Referring Provider  Fransico Him MD       Initial Encounter Date:    CARDIAC REHAB [place] from 04/20/2019 in [place]  Date  04/20/19      Visit Diagnosis: S/P CABG x 2  S/P AVR (aortic valve replacement)  Patient's Home Medications on Admission:  Current Outpatient Medications:  .  acetaminophen (TYLENOL) 500 MG tablet, Take 1,000 mg by mouth every 6 (six) hours as needed for moderate pain. , Disp: , Rfl:  .  amLODipine (NORVASC) 10 MG tablet, Take 10 mg by mouth every morning. , Disp: , Rfl:  .  atorvastatin (LIPITOR) 40 MG tablet, Take 40 mg by mouth every evening. , Disp: , Rfl: 2 .  betamethasone, augmented, (DIPROLENE) 0.05 % lotion, betamethasone, augmented 0.05 % lotion, Disp: , Rfl:  .  bisacodyl (BISACODYL) 5 MG EC tablet, Take 5 mg by mouth daily as needed for moderate constipation., Disp: , Rfl:  .  Brinzolamide-Brimonidine 1-0.2 % SUSP, Place 1 drop into both eyes 2 (two) times daily. , Disp: , Rfl:  .  cephALEXin (KEFLEX) 500 MG capsule, Take 1 capsule (500 mg total) by mouth 2 (two) times daily. X 3 days to prevent post-op infection (Patient not taking: Reported on 04/17/2019), Disp: 6 capsule, Rfl: 0 .  Cholecalciferol 1000 units tablet, Take 1,000 Units by mouth every morning. , Disp: , Rfl:  .  diclofenac sodium (VOLTAREN) 1 % GEL, Apply 2 g topically daily as needed (knee pain)., Disp: , Rfl:  .  finasteride (PROSCAR) 5 MG tablet, Take 5 mg by mouth every morning. , Disp: , Rfl: 3 .  furosemide (LASIX) 40 MG tablet, Take 40 mg by mouth every morning., Disp: , Rfl:  .  insulin glargine (LANTUS) 100 UNIT/ML injection, Inject 8 Units into the skin at bedtime. , Disp: , Rfl:  .   ipratropium (ATROVENT) 0.06 % nasal spray, Place into the nose., Disp: , Rfl:  .  latanoprost (XALATAN) 0.005 % ophthalmic solution, Place 1 drop into both eyes at bedtime. , Disp: , Rfl:  .  lisinopril (ZESTRIL) 5 MG tablet, Take by mouth., Disp: , Rfl:  .  metFORMIN (GLUCOPHAGE) 500 MG tablet, Take 1,000 mg by mouth 2 (two) times daily with a meal. , Disp: , Rfl:  .  metoprolol tartrate (LOPRESSOR) 50 MG tablet, Take 50 mg by mouth 2 (two) times daily. , Disp: , Rfl:  .  niacin (SLO-NIACIN) 500 MG tablet, Take 500 mg by mouth every morning. , Disp: , Rfl:  .  oxyCODONE (OXY IR/ROXICODONE) 5 MG immediate release tablet, , Disp: , Rfl:  .  potassium chloride SA (K-DUR,KLOR-CON) 20 MEQ tablet, Take 20 mEq by mouth every morning., Disp: , Rfl:  .  Semaglutide,0.25 or 0.5MG/DOS, 2 MG/1.5ML SOPN, Inject 0.5 mg into the skin once a week. , Disp: , Rfl:  .  sildenafil (VIAGRA) 100 MG tablet, TAKE ONE TABLET BY MOUTH APPROXIMATELY ONE HOUR BEFORE SEXUAL ACTIVITY., Disp: , Rfl:  .  tamsulosin (FLOMAX) 0.4 MG CAPS capsule, Take 0.4 mg by mouth at bedtime. , Disp: , Rfl:  .  traMADol (ULTRAM) 50 MG tablet, Take 1-2 tablets (50-100 mg total) by mouth every 6 (six) hours as needed for moderate  pain or severe pain. Post-operatively (Patient not taking: Reported on 04/17/2019), Disp: 20 tablet, Rfl: 0  Past Medical History: Past Medical History:  Diagnosis Date  . Aortic valve stenosis, moderate    per echo 06-02-2017 (in epic)  ef 55-60%,  possible bicuspid AV,  severeely thickened and calcified with severely restricted leaflet opening,  valve area 1.17cm^2, mean gradient 64mHg, peak gradient 575mg,  moderate AR  . Arthritis    oa  . BPH associated with nocturia   . Complication of anesthesia    due to heart murmur  mod to severe aortic stenosis  . Full dentures   . Glaucoma, both eyes   . Heart murmur, systolic   . Hiatal hernia   . Hypertension   . Left ureteral stone   . Myocardial infarction  ([facility]   [facility] [place] [facility]  . SIRS (systemic inflammatory response syndrome) [place]   hospital admission--  unknown etiology  . Type 2 diabetes mellitus ([facility]  . Wears glasses     Tobacco Use: Social History   Tobacco Use  Smoking Status Former Smoker  . Years: 30.00  . Types: Cigarettes  . Quit date: 01/28/1995  . Years since quitting: 24.4  Smokeless Tobacco Never Used    Labs: Recent Review Flowsheet Data    There is no flowsheet data to display.      Capillary Blood Glucose: Lab Results  Component Value Date   GLUCAP 119 (H) 05/22/2019   GLUCAP 207 (H) 05/08/2019   GLUCAP 151 (H) 05/03/2019   GLUCAP 151 (H) 05/01/2019   GLUCAP 122 (H) 04/26/2019     Exercise Target Goals: Exercise Program Goal: Individual exercise prescription set using results from initial 6 min walk test and THRR while considering  patient's activity barriers and safety.   Exercise Prescription Goal: Starting with aerobic activity 30 plus minutes a day, 3 days per week for initial exercise prescription. Provide home exercise prescription and guidelines that participant acknowledges understanding prior to discharge.  Activity Barriers & Risk Stratification:   6 Minute Walk:   Oxygen Initial Assessment:   Oxygen Re-Evaluation:   Oxygen Discharge (Final Oxygen Re-Evaluation):   Initial Exercise Prescription:   Perform Capillary Blood Glucose checks as needed.  Exercise Prescription Changes: Exercise Prescription Changes    Row Name 05/22/19 0915 05/31/19 0915 07/06/19 1400         Response to Exercise   Blood Pressure (Admit)  112/64  124/64  108/61     Blood Pressure (Exercise)  132/72  140/76  --     Blood Pressure (Exit)  112/60  122/72  116/62     Heart Rate (Admit)  88 bpm  93 bpm  108 bpm     Heart Rate (Exercise)  119 bpm  120 bpm  116 bpm     Heart Rate (Exit)  98 bpm  97 bpm  --     Rating of Perceived Exertion  (Exercise)  11  11  11      Symptoms  None  None  None     Comments  --  Pt last day for in person rehab. Transitioning to virtual CR program.   Virtual cardiac rehab session.     Duration  Continue with 30 min of aerobic exercise without signs/symptoms of physical distress.  Continue with 30 min of aerobic exercise without signs/symptoms of physical distress.  Continue with 30 min of aerobic exercise without signs/symptoms  of physical distress.     Intensity  THRR unchanged  THRR unchanged  THRR unchanged       Progression   Progression  Continue to progress workloads to maintain intensity without signs/symptoms of physical distress.  Continue to progress workloads to maintain intensity without signs/symptoms of physical distress.  Continue to progress workloads to maintain intensity without signs/symptoms of physical distress.     Average METs  2.4  2.6  --       Resistance Training   Training Prescription  Yes  No  No     Weight  3lbs  --  --     Reps  10-15  --  --     Time  10 Minutes  --  --       Interval Training   Interval Training  No  No  No       Bike   Minutes  --  --  18       Recumbant Bike   Level  3  4  --     Minutes  15  15  --     METs  2.2  2.4  --       NuStep   Level  3  4  --     SPM  85  85  --     Minutes  15  15  --     METs  2.6  2.9  --        Exercise Comments: Exercise Comments    Row Name 06/01/19 1316 07/06/19 1448         Exercise Comments  Pt is progressing well and wants to participate in virtual CR program. Pt is active in the app and is going to start logging his exercise sessions.  Patient will resume exercise in the onsite cardiac rehab program on 07/17/2019.         Exercise Goals and Review:   Exercise Goals Re-Evaluation : Exercise Goals Re-Evaluation    Row Name 06/01/19 1314 07/06/19 1445           Exercise Goal Re-Evaluation   Exercise Goals Review  Increase Physical Activity;Able to understand and use rate of  perceived exertion (RPE) scale;Increase Strength and Stamina;Knowledge and understanding of Target Heart Rate Range (THRR);Able to check pulse independently;Understanding of Exercise Prescription  Increase Physical Activity;Able to understand and use rate of perceived exertion (RPE) scale;Increase Strength and Stamina;Knowledge and understanding of Target Heart Rate Range (THRR);Able to check pulse independently;Understanding of Exercise Prescription      Comments  Pt is progressing well and has a MET level of 2.6. Pt completed 14 sessions. Pt was offered virtual CR program due to department closure for [facility]. Pt wants to participate in virutal CR program and is now active on the app. Pt will start logging his exercise sessions on the app. Pt was encouraged to exercise 5-7 days for 30-45 minutes each day.  Patient has been actively participating in the virtual cardiac rehab program via the [facility] app and has been progressing well. Patient is riding his stationary bike and walking 15-20 minutes, 4-6 days/week.      Expected Outcomes  Pt will continue exercising at home.  Patient will transition back to the onsite cardiac rehab program.          Discharge Exercise Prescription (Final Exercise Prescription Changes): Exercise Prescription Changes - 07/06/19 1400      Response to Exercise   Blood Pressure (  Admit)  108/61    Blood Pressure (Exit)  116/62    Heart Rate (Admit)  108 bpm    Heart Rate (Exercise)  116 bpm    Rating of Perceived Exertion (Exercise)  11    Symptoms  None    Comments  Virtual cardiac rehab session.    Duration  Continue with 30 min of aerobic exercise without signs/symptoms of physical distress.    Intensity  THRR unchanged      Progression   Progression  Continue to progress workloads to maintain intensity without signs/symptoms of physical distress.      Resistance Training   Training Prescription  No      Interval Training   Interval Training  No       Bike   Minutes  18      Recumbant Bike   Level  --    Minutes  --    METs  --      NuStep   Level  --    SPM  --    Minutes  --    METs  --       Nutrition:  Target Goals: Understanding of nutrition guidelines, daily intake of sodium <1532m, cholesterol <2011m calories 30% from fat and 7% or less from saturated fats, daily to have 5 or more servings of fruits and vegetables.  Biometrics:    Nutrition Therapy Plan and Nutrition Goals:   Nutrition Assessments:   Nutrition Goals Re-Evaluation: Nutrition Goals Re-Evaluation    Row Name 05/30/19 1512             Goals   Current Weight  178 lb 2.1 oz (80.8 kg)       Nutrition Goal  Pt to identify and limit food sources of saturated fat, trans fat, refined carbohydrates and sodium         Personal Goal #2 Re-Evaluation   Personal Goal #2  Improved blood glucose control as evidenced by pt's A1c trending from 7.1 toward less than 5.6.         Personal Goal #3 Re-Evaluation   Personal Goal #3  Pt able to name foods that affect blood glucose          Nutrition Goals Discharge (Final Nutrition Goals Re-Evaluation): Nutrition Goals Re-Evaluation - 05/30/19 1512      Goals   Current Weight  178 lb 2.1 oz (80.8 kg)    Nutrition Goal  Pt to identify and limit food sources of saturated fat, trans fat, refined carbohydrates and sodium      Personal Goal #2 Re-Evaluation   Personal Goal #2  Improved blood glucose control as evidenced by pt's A1c trending from 7.1 toward less than 5.6.      Personal Goal #3 Re-Evaluation   Personal Goal #3  Pt able to name foods that affect blood glucose       Psychosocial: Target Goals: Acknowledge presence or absence of significant depression and/or stress, maximize coping skills, provide positive support system. Participant is able to verbalize types and ability to use techniques and skills needed for reducing stress and depression.  Initial Review & Psychosocial  Screening:   Quality of Life Scores:  Scores of 19 and below usually indicate a poorer quality of life in these areas.  A difference of  2-3 points is a clinically meaningful difference.  A difference of 2-3 points in the total score of the Quality of Life Index has been associated with significant improvement in overall quality  of life, self-image, physical symptoms, and general health in studies assessing change in quality of life.  PHQ-9: Recent Review Flowsheet Data    Depression screen [facility] 2/9 04/20/2019   Decreased Interest 0   Down, Depressed, Hopeless 0   PHQ - 2 Score 0     Interpretation of Total Score  Total Score Depression Severity:  1-4 = Minimal depression, 5-9 = Mild depression, 10-14 = Moderate depression, 15-19 = Moderately severe depression, 20-27 = Severe depression   Psychosocial Evaluation and Intervention:   Psychosocial Re-Evaluation: Psychosocial Re-Evaluation    Row Name 05/25/19 1044 07/12/19 1341           Psychosocial Re-Evaluation   Current issues with  None Identified  None Identified      Interventions  Encouraged to attend Cardiac Rehabilitation for the exercise  Encouraged to attend Cardiac Rehabilitation for the exercise      Continue Psychosocial Services   No Follow up required  No Follow up required         Psychosocial Discharge (Final Psychosocial Re-Evaluation): Psychosocial Re-Evaluation - 07/12/19 1341      Psychosocial Re-Evaluation   Current issues with  None Identified    Interventions  Encouraged to attend Cardiac Rehabilitation for the exercise    Continue Psychosocial Services   No Follow up required       Vocational Rehabilitation: Provide vocational rehab assistance to qualifying candidates.   Vocational Rehab Evaluation & Intervention:   Education: Education Goals: Education classes will be provided on a weekly basis, covering required topics. Participant will state understanding/return demonstration of topics  presented.  Learning Barriers/Preferences:   Education Topics: Hypertension, Hypertension Reduction -Define heart disease and high blood pressure. Discus how high blood pressure affects the body and ways to reduce high blood pressure.   Exercise and Your Heart -Discuss why it is important to exercise, the FITT principles of exercise, normal and abnormal responses to exercise, and how to exercise safely.   Angina -Discuss definition of angina, causes of angina, treatment of angina, and how to decrease risk of having angina.   Cardiac Medications -Review what the following cardiac medications are used for, how they affect the body, and side effects that may occur when taking the medications.  Medications include Aspirin, Beta blockers, calcium channel blockers, ACE Inhibitors, angiotensin receptor blockers, diuretics, digoxin, and antihyperlipidemics.   Congestive Heart Failure -Discuss the definition of CHF, how to live with CHF, the signs and symptoms of CHF, and how keep track of weight and sodium intake.   Heart Disease and Intimacy -Discus the effect sexual activity has on the heart, how changes occur during intimacy as we age, and safety during sexual activity.   Smoking Cessation / COPD -Discuss different methods to quit smoking, the health benefits of quitting smoking, and the definition of COPD.   Nutrition I: Fats -Discuss the types of cholesterol, what cholesterol does to the heart, and how cholesterol levels can be controlled.   Nutrition II: Labels -Discuss the different components of food labels and how to read food label   Heart Parts/Heart Disease and PAD -Discuss the anatomy of the heart, the pathway of blood circulation through the heart, and these are affected by heart disease.   Stress I: Signs and Symptoms -Discuss the causes of stress, how stress may lead to anxiety and depression, and ways to limit stress.   Stress II: Relaxation -Discuss  different types of relaxation techniques to limit stress.   Warning Signs of Stroke /  TIA -Discuss definition of a stroke, what the signs and symptoms are of a stroke, and how to identify when someone is having stroke.   Knowledge Questionnaire Score:   Core Components/Risk Factors/Patient Goals at Admission:   Core Components/Risk Factors/Patient Goals Review:  Goals and Risk Factor Review    Row Name 05/25/19 1045 06/05/19 1418 07/12/19 1341         Core Components/Risk Factors/Patient Goals Review   Personal Goals Review  Diabetes;Hypertension;Lipids  Diabetes;Hypertension;Lipids  Diabetes;Hypertension;Lipids     Review  Pt with multiple CAD RFs willing to participate in CR exercise.  Ozzy has been doing well with exercise. Keyden vital signs and CBG's have been stable.  Cardiac rehab is currently on hold due to the [place] 19 pandemic. Patient is currently using the virtual cardiac rehab APP  Bobbye has been doing well with exercise at home and is using the virtual cardiac rehab [facility] app. Andranik is resuming phase 2 cardiac rehab on 07/12/19     Expected Outcomes  Priyansh will continue to participate in CR exercise, nutrition, and lifestyle modification opportunities. Sheehan plans to transitions to virtual cardiac rehab when in person cardiac rehab is placed on hold due to [place] will continue to participate in CR exercise, nutrition, and lifestyle modification opportunities. Bawi plans to transitions to virtual cardiac rehab when in person cardiac rehab is placed on hold due to [place] will continue to participate in CR exercise, nutrition, and lifestyle modification opportunities        Core Components/Risk Factors/Patient Goals at Discharge (Final Review):  Goals and Risk Factor Review - 07/12/19 1341      Core Components/Risk Factors/Patient Goals Review   Personal Goals Review  Diabetes;Hypertension;Lipids    Review   Jadier has been doing well with exercise at home and is using the virtual cardiac rehab [facility] app. Danen is resuming phase 2 cardiac rehab on 07/12/19    Expected Outcomes  Jeremiyah will continue to participate in CR exercise, nutrition, and lifestyle modification opportunities       ITP Comments: ITP Comments    Row Name 06/05/19 1408 07/12/19 1335         ITP Comments  30 Day ITP Review. Saheed did well with exercise at phase 2 cardiac rehab. Phase 2 cardiac is currently on hold due to the COVID 19 pandemic.  30 Day ITP Review. Hillery is exercising at home using his stationary bike and using the virtual cardiac rehab [facility] APP. Ehan is resuming in person phase 2 cardiac rehab on 07/17/19         Comments: See ITP comments.Barnet Pall, RN,BSN 07/12/2019 1:45 PM

## 2019-07-17 ENCOUNTER — Other Ambulatory Visit: Payer: Self-pay

## 2019-07-17 ENCOUNTER — Encounter (HOSPITAL_COMMUNITY)
Admission: RE | Admit: 2019-07-17 | Discharge: 2019-07-17 | Disposition: A | Discharge status: Home / Self Care | Payer: No Typology Code available for payment source | Source: Ambulatory Visit | Attending: Cardiology | Admitting: Cardiology

## 2019-07-17 DIAGNOSIS — Z7984 Long term (current) use of oral hypoglycemic drugs: Secondary | ICD-10-CM | POA: Diagnosis not present

## 2019-07-17 DIAGNOSIS — Z951 Presence of aortocoronary bypass graft: Secondary | ICD-10-CM | POA: Diagnosis not present

## 2019-07-17 DIAGNOSIS — I252 Old myocardial infarction: Secondary | ICD-10-CM | POA: Diagnosis not present

## 2019-07-17 DIAGNOSIS — Z952 Presence of prosthetic heart valve: Secondary | ICD-10-CM | POA: Diagnosis present

## 2019-07-17 DIAGNOSIS — I10 Essential (primary) hypertension: Secondary | ICD-10-CM | POA: Diagnosis not present

## 2019-07-17 DIAGNOSIS — E119 Type 2 diabetes mellitus without complications: Secondary | ICD-10-CM | POA: Diagnosis not present

## 2019-07-17 DIAGNOSIS — Z7982 Long term (current) use of aspirin: Secondary | ICD-10-CM | POA: Diagnosis not present

## 2019-07-17 DIAGNOSIS — Z79899 Other long term (current) drug therapy: Secondary | ICD-10-CM | POA: Diagnosis not present

## 2019-07-17 NOTE — Progress Notes (Signed)
Daily Session Note  Patient Details  Name: Jeremy Nicholson MRN: [id number] Date of Birth: Nov 19, 1945 Referring Provider:     [place] from 04/20/2019 in [place]  Referring Provider  Fransico Him MD       Encounter Date: 07/17/2019  Check In: Session Check In - 07/17/19 0921      Check-In   Supervising physician immediately available to respond to emergencies  [facility] immediately available    Physician(s)  Dr. Maylene Roes    Location  [facility]    Staff Present  Jiles Garter, RN, BSN;Dalton Kris Mouton, MS, Exercise Physiologist;Portia Rollene Rotunda, RN, Deland Pretty, MS, ACSM CEP, Exercise Physiologist;Brittany Durene Fruits, BS, ACSM CEP, Exercise Physiologist    Virtual Visit  No    Medication changes reported      No    Tobacco Cessation  No Change    Warm-up and Cool-down  Performed on first and last piece of equipment    Resistance Training Performed  Yes    VAD Patient?  No    PAD/SET Patient?  No      Pain Assessment   Currently in Pain?  No/denies    Multiple Pain Sites  No       Capillary Blood Glucose: No results found for this or any previous visit (from the past 24 hour(s)).  Exercise Prescription Changes - 07/17/19 1100      Response to Exercise   Blood Pressure (Admit)  106/58    Blood Pressure (Exercise)  158/78    Blood Pressure (Exit)  110/64    Heart Rate (Admit)  93 bpm    Heart Rate (Exercise)  107 bpm    Heart Rate (Exit)  93 bpm    Rating of Perceived Exertion (Exercise)  12    Symptoms  None    Comments  Return to CR program.     Duration  Continue with 30 min of aerobic exercise without signs/symptoms of physical distress.    Intensity  THRR unchanged      Progression   Progression  Continue to progress workloads to maintain intensity without signs/symptoms of physical distress.    Average METs  2.6      Resistance Training   Training Prescription  Yes    Weight  4  lbs.     Reps  10-15    Time  10 Minutes      Interval Training   Interval Training  No      Treadmill   MPH  2    Grade  1    Minutes  15    METs  2.3      NuStep   Level  4    SPM  85    Minutes  15    METs  2.9       Social History   Tobacco Use  Smoking Status Former Smoker  . Years: 30.00  . Types: Cigarettes  . Quit date: 01/28/1995  . Years since quitting: 24.4  Smokeless Tobacco Never Used    Goals Met:  Exercise tolerated well  Goals Unmet:  Not Applicable  Comments: Pt started cardiac rehab today.  Pt tolerated light exercise without difficulty. VSS, telemetry-SR, asymptomatic.  Medication list reconciled. Pt denies barriers to medicaiton compliance.      Understanding verbalized.    Dr. Fransico Him is Medical Director for Cardiac Rehab at [facility].

## 2019-07-19 ENCOUNTER — Encounter (HOSPITAL_COMMUNITY)
Admission: RE | Admit: 2019-07-19 | Discharge: 2019-07-19 | Disposition: A | Discharge status: Home / Self Care | Payer: No Typology Code available for payment source | Source: Ambulatory Visit | Attending: Cardiology | Admitting: Cardiology

## 2019-07-19 ENCOUNTER — Other Ambulatory Visit: Payer: Self-pay

## 2019-07-19 DIAGNOSIS — Z951 Presence of aortocoronary bypass graft: Secondary | ICD-10-CM | POA: Diagnosis not present

## 2019-07-19 DIAGNOSIS — Z952 Presence of prosthetic heart valve: Secondary | ICD-10-CM

## 2019-07-21 ENCOUNTER — Other Ambulatory Visit: Payer: Self-pay

## 2019-07-21 ENCOUNTER — Encounter (HOSPITAL_COMMUNITY)
Admission: RE | Admit: 2019-07-21 | Discharge: 2019-07-21 | Disposition: A | Discharge status: Home / Self Care | Payer: No Typology Code available for payment source | Source: Ambulatory Visit | Attending: Cardiology | Admitting: Cardiology

## 2019-07-21 DIAGNOSIS — Z952 Presence of prosthetic heart valve: Secondary | ICD-10-CM

## 2019-07-21 DIAGNOSIS — Z951 Presence of aortocoronary bypass graft: Secondary | ICD-10-CM

## 2019-07-24 ENCOUNTER — Other Ambulatory Visit: Payer: Self-pay

## 2019-07-24 ENCOUNTER — Encounter (HOSPITAL_COMMUNITY)
Admission: RE | Admit: 2019-07-24 | Discharge: 2019-07-24 | Disposition: A | Discharge status: Home / Self Care | Payer: No Typology Code available for payment source | Source: Ambulatory Visit | Attending: Cardiology | Admitting: Cardiology

## 2019-07-24 DIAGNOSIS — Z952 Presence of prosthetic heart valve: Secondary | ICD-10-CM

## 2019-07-24 DIAGNOSIS — Z951 Presence of aortocoronary bypass graft: Secondary | ICD-10-CM

## 2019-07-26 ENCOUNTER — Other Ambulatory Visit: Payer: Self-pay

## 2019-07-26 ENCOUNTER — Encounter (HOSPITAL_COMMUNITY)
Admission: RE | Admit: 2019-07-26 | Discharge: 2019-07-26 | Disposition: A | Discharge status: Home / Self Care | Payer: No Typology Code available for payment source | Source: Ambulatory Visit | Attending: Cardiology | Admitting: Cardiology

## 2019-07-26 DIAGNOSIS — Z951 Presence of aortocoronary bypass graft: Secondary | ICD-10-CM | POA: Diagnosis not present

## 2019-07-26 DIAGNOSIS — Z952 Presence of prosthetic heart valve: Secondary | ICD-10-CM

## 2019-07-28 ENCOUNTER — Other Ambulatory Visit: Payer: Self-pay

## 2019-07-28 ENCOUNTER — Encounter (HOSPITAL_COMMUNITY)
Admission: RE | Admit: 2019-07-28 | Discharge: 2019-07-28 | Disposition: A | Discharge status: Home / Self Care | Payer: No Typology Code available for payment source | Source: Ambulatory Visit | Attending: Cardiology | Admitting: Cardiology

## 2019-07-28 DIAGNOSIS — Z951 Presence of aortocoronary bypass graft: Secondary | ICD-10-CM | POA: Diagnosis not present

## 2019-07-28 DIAGNOSIS — Z952 Presence of prosthetic heart valve: Secondary | ICD-10-CM

## 2019-07-31 ENCOUNTER — Other Ambulatory Visit: Payer: Self-pay

## 2019-07-31 ENCOUNTER — Encounter (HOSPITAL_COMMUNITY)
Admission: RE | Admit: 2019-07-31 | Discharge: 2019-07-31 | Disposition: A | Discharge status: Home / Self Care | Payer: No Typology Code available for payment source | Source: Ambulatory Visit | Attending: Cardiology | Admitting: Cardiology

## 2019-07-31 DIAGNOSIS — Z951 Presence of aortocoronary bypass graft: Secondary | ICD-10-CM

## 2019-07-31 DIAGNOSIS — Z79899 Other long term (current) drug therapy: Secondary | ICD-10-CM | POA: Insufficient documentation

## 2019-07-31 DIAGNOSIS — E119 Type 2 diabetes mellitus without complications: Secondary | ICD-10-CM | POA: Diagnosis not present

## 2019-07-31 DIAGNOSIS — Z7984 Long term (current) use of oral hypoglycemic drugs: Secondary | ICD-10-CM | POA: Diagnosis not present

## 2019-07-31 DIAGNOSIS — Z7982 Long term (current) use of aspirin: Secondary | ICD-10-CM | POA: Insufficient documentation

## 2019-07-31 DIAGNOSIS — I10 Essential (primary) hypertension: Secondary | ICD-10-CM | POA: Diagnosis not present

## 2019-07-31 DIAGNOSIS — Z952 Presence of prosthetic heart valve: Secondary | ICD-10-CM | POA: Insufficient documentation

## 2019-07-31 DIAGNOSIS — I252 Old myocardial infarction: Secondary | ICD-10-CM | POA: Insufficient documentation

## 2019-08-02 ENCOUNTER — Encounter (HOSPITAL_COMMUNITY)
Admission: RE | Admit: 2019-08-02 | Discharge: 2019-08-02 | Disposition: A | Discharge status: Home / Self Care | Payer: No Typology Code available for payment source | Source: Ambulatory Visit | Attending: Cardiology | Admitting: Cardiology

## 2019-08-02 ENCOUNTER — Other Ambulatory Visit: Payer: Self-pay

## 2019-08-02 DIAGNOSIS — Z951 Presence of aortocoronary bypass graft: Secondary | ICD-10-CM | POA: Diagnosis not present

## 2019-08-02 DIAGNOSIS — Z952 Presence of prosthetic heart valve: Secondary | ICD-10-CM

## 2019-08-04 ENCOUNTER — Encounter (HOSPITAL_COMMUNITY)
Admission: RE | Admit: 2019-08-04 | Discharge: 2019-08-04 | Disposition: A | Discharge status: Home / Self Care | Payer: No Typology Code available for payment source | Source: Ambulatory Visit | Attending: Cardiology | Admitting: Cardiology

## 2019-08-04 ENCOUNTER — Other Ambulatory Visit: Payer: Self-pay

## 2019-08-04 DIAGNOSIS — Z951 Presence of aortocoronary bypass graft: Secondary | ICD-10-CM

## 2019-08-04 DIAGNOSIS — Z952 Presence of prosthetic heart valve: Secondary | ICD-10-CM

## 2019-08-07 ENCOUNTER — Encounter (HOSPITAL_COMMUNITY)
Admission: RE | Admit: 2019-08-07 | Discharge: 2019-08-07 | Disposition: A | Discharge status: Home / Self Care | Payer: No Typology Code available for payment source | Source: Ambulatory Visit | Attending: Cardiology | Admitting: Cardiology

## 2019-08-07 ENCOUNTER — Other Ambulatory Visit: Payer: Self-pay

## 2019-08-07 VITALS — Ht 69.5 in | Wt 176.6 lb

## 2019-08-07 DIAGNOSIS — Z952 Presence of prosthetic heart valve: Secondary | ICD-10-CM

## 2019-08-07 DIAGNOSIS — Z951 Presence of aortocoronary bypass graft: Secondary | ICD-10-CM | POA: Diagnosis not present

## 2019-08-10 NOTE — Progress Notes (Signed)
Discharge Progress Report  Patient Details  Name: Jeremy Nicholson MRN: [id number] Date of Birth: Apr 15, 1946 Referring Provider:     Mar[place] from 04/20/2019 in [place]  Referring Provider  Fransico Him MD        Number of Visits: 24  Reason for Discharge:  Patient reached a stable level of exercise. Patient has met program and personal goals.  Smoking History:  Social History   Tobacco Use  Smoking Status Former Smoker  . Years: 30.00  . Types: Cigarettes  . Quit date: 01/28/1995  . Years since quitting: 24.5  Smokeless Tobacco Never Used    Diagnosis:  S/P CABG x 2  S/P AVR (aortic valve replacement)  ADL UCSD:   Initial Exercise Prescription: Initial Exercise Prescription - 04/26/19 1000      Date of Initial Exercise RX and Referring Provider   Date  --    Referring Provider  --    Expected Discharge Date  --      Recumbant Bike   Level  --    Watts  --    Minutes  --    METs  --      NuStep   Level  --    SPM  --    Minutes  --    METs  --      Prescription Details   Frequency (times per week)  --      Intensity   THRR 40-80% of Max Heartrate  --    Ratings of Perceived Exertion  --    Perceived Dyspnea  --      Progression   Progression  --      Resistance Training   Training Prescription  --       Discharge Exercise Prescription (Final Exercise Prescription Changes): Exercise Prescription Changes - 08/07/19 1000      Response to Exercise   Blood Pressure (Admit)  102/72    Blood Pressure (Exercise)  128/66    Blood Pressure (Exit)  106/58    Heart Rate (Admit)  86 bpm    Heart Rate (Exercise)  104 bpm    Heart Rate (Exit)  86 bpm    Rating of Perceived Exertion (Exercise)  11    Symptoms  None    Comments  Pt last day of exercise.     Duration  Continue with 30 min of aerobic exercise without signs/symptoms of physical distress.    Intensity  THRR unchanged       Progression   Progression  Continue to progress workloads to maintain intensity without signs/symptoms of physical distress.    Average METs  2.7      Resistance Training   Training Prescription  Yes    Weight  5 lbs.     Reps  10-15    Time  10 Minutes      Interval Training   Interval Training  No      Treadmill   MPH  2    Grade  1    Minutes  15    METs  2.81      NuStep   Level  4    SPM  85    Minutes  15    METs  2.7       Functional Capacity: 6 Minute Walk    Row Name 04/20/19 1034 08/02/19 1027       6 Minute Walk   Phase  Initial  Discharge    Distance  1134 feet  1254 feet    Distance % Change  --  10.58 %    Distance Feet Change  --  120 ft    Walk Time  6 minutes  6 minutes    # of Rest Breaks  0  0    MPH  2.15  2.3    METS  2.76  2.9    RPE  11  11    Perceived Dyspnea   0  0    VO2 Peak  9.7  10.38    Symptoms  No  No    Resting HR  96 bpm  86 bpm    Resting BP  118/70  120/58    Resting Oxygen Saturation   100 %  --    Exercise Oxygen Saturation  during 6 min walk  98 %  --    Max Ex. HR  98 bpm  102 bpm    Max Ex. BP  148/70  142/64    2 Minute Post BP  132/72  110/60       Psychological, QOL, Others - Outcomes: PHQ 2/9: Depression screen [facility] 2/9 08/10/2019 04/20/2019  Decreased Interest 0 0  Down, Depressed, Hopeless 0 0  PHQ - 2 Score 0 0    Quality of Life: Quality of Life - 08/07/19 1033      Quality of Life   Select  Quality of Life      Quality of Life Scores   Health/Function Post  30 %    Socioeconomic Post  27.86 %    Psych/Spiritual Post  30 %    Family Post  30 %    GLOBAL Post  29.55 %       Personal Goals: Goals established at orientation with interventions provided to work toward goal. Personal Goals and Risk Factors at Admission - 04/20/19 1037      Core Components/Risk Factors/Patient Goals on Admission   Diabetes  Yes    Intervention  Provide education about signs/symptoms and action to take for  hypo/hyperglycemia.;Provide education about proper nutrition, including hydration, and aerobic/resistive exercise prescription along with prescribed medications to achieve blood glucose in normal ranges: Fasting glucose 65-99 mg/dL    Expected Outcomes  Short Term: Participant verbalizes understanding of the signs/symptoms and immediate care of hyper/hypoglycemia, proper foot care and importance of medication, aerobic/resistive exercise and nutrition plan for blood glucose control.;Long Term: Attainment of HbA1C < 7%.    Hypertension  Yes    Intervention  Monitor prescription use compliance.;Provide education on lifestyle modifcations including regular physical activity/exercise, weight management, moderate sodium restriction and increased consumption of fresh fruit, vegetables, and low fat dairy, alcohol moderation, and smoking cessation.    Expected Outcomes  Long Term: Maintenance of blood pressure at goal levels.;Short Term: Continued assessment and intervention until BP is < 140/100m HG in hypertensive participants. < 130/873mHG in hypertensive participants with diabetes, heart failure or chronic kidney disease.    Lipids  Yes    Intervention  Provide education and support for participant on nutrition & aerobic/resistive exercise along with prescribed medications to achieve LDL <7010mHDL >48m61m  Expected Outcomes  Short Term: Participant states understanding of desired cholesterol values and is compliant with medications prescribed. Participant is following exercise prescription and nutrition guidelines.;Long Term: Cholesterol controlled with medications as prescribed, with individualized exercise RX and with personalized nutrition plan. Value goals: LDL < 70mg62mL > 40 mg.  Personal Goals Discharge: Goals and Risk Factor Review    Row Name 04/26/19 1647 05/11/19 1537 05/11/19 1541 05/25/19 1045 06/05/19 1418     Core Components/Risk Factors/Patient Goals Review   Personal Goals  Review  Diabetes;Hypertension;Lipids  --  Diabetes;Hypertension;Lipids  Diabetes;Hypertension;Lipids  Diabetes;Hypertension;Lipids   Review  Pt with multiple CAD RFs willing to participate in CR exercise.  Jaevion would like to increase his strength, stamina, and overall health.  Pt with multiple CAD RFs willing to participate in CR exercise.  Veron has been doing well with exercise. Maziah vital signs and CBG's have been stable.  --  Pt with multiple CAD RFs willing to participate in CR exercise.  Koben has been doing well with exercise. Jaisean vital signs and CBG's have been stable.  Cardiac rehab is currently on hold due to the [place] 19 pandemic. Patient is currently using the virtual cardiac rehab APP   Expected Outcomes  Theron will continue to participate in CR exercise, nutrition, and lifestyle modification opportunities.  Josian will continue to participate in CR exercise, nutrition, and lifestyle modification opportunities.  --  Jakeb will continue to participate in CR exercise, nutrition, and lifestyle modification opportunities. Theophil plans to transitions to virtual cardiac rehab when in person cardiac rehab is placed on hold due to [place] will continue to participate in CR exercise, nutrition, and lifestyle modification opportunities. Kile plans to transitions to virtual cardiac rehab when in person cardiac rehab is placed on hold due to [place] Name 07/12/19 1341 08/10/19 1431           Core Components/Risk Factors/Patient Goals Review   Personal Goals Review  Diabetes;Hypertension;Lipids  Diabetes;Hypertension;Lipids      Review  Shamari has been doing well with exercise at home and is using the virtual cardiac rehab better hearts app. Frederico is resuming phase 2 cardiac rehab on 07/12/19  [place] has graduated from [employer] with 24 completed sessions.  He did very well in the program.      Expected Outcomes  Ziyan will continue to participate  in CR exercise, nutrition, and lifestyle modification opportunities  Vincient will continue to participate in exercise, nutrition, and lifestyle modification opportunities to reduce his risk of CV disease.  He plans to walk for exercise.         Exercise Goals and Review: Exercise Goals    Row Name 04/20/19 1036             Exercise Goals   Increase Physical Activity  Yes       Intervention  Provide advice, education, support and counseling about physical activity/exercise needs.;Develop an individualized exercise prescription for aerobic and resistive training based on initial evaluation findings, risk stratification, comorbidities and participant's personal goals.       Expected Outcomes  Short Term: Attend rehab on a regular basis to increase amount of physical activity.;Long Term: Add in home exercise to make exercise part of routine and to increase amount of physical activity.;Long Term: Exercising regularly at least 3-5 days a week.       Increase Strength and Stamina  Yes       Intervention  Provide advice, education, support and counseling about physical activity/exercise needs.;Develop an individualized exercise prescription for aerobic and resistive training based on initial evaluation findings, risk stratification, comorbidities and participant's personal goals.       Expected Outcomes  Short Term: Increase workloads from initial exercise prescription for resistance, speed, and METs.;Short Term:  Perform resistance training exercises routinely during rehab and add in resistance training at home;Long Term: Improve cardiorespiratory fitness, muscular endurance and strength as measured by increased METs and functional capacity (6MWT)       Able to understand and use rate of perceived exertion (RPE) scale  Yes       Intervention  Provide education and explanation on how to use RPE scale       Expected Outcomes  Long Term:  Able to use RPE to guide intensity level when exercising  independently;Short Term: Able to use RPE daily in rehab to express subjective intensity level       Knowledge and understanding of Target Heart Rate Range (THRR)  Yes       Intervention  Provide education and explanation of THRR including how the numbers were predicted and where they are located for reference       Expected Outcomes  Long Term: Able to use THRR to govern intensity when exercising independently;Short Term: Able to state/look up THRR;Short Term: Able to use daily as guideline for intensity in rehab       Able to check pulse independently  Yes       Intervention  Review the importance of being able to check your own pulse for safety during independent exercise;Provide education and demonstration on how to check pulse in carotid and radial arteries.       Expected Outcomes  Short Term: Able to explain why pulse checking is important during independent exercise;Long Term: Able to check pulse independently and accurately       Understanding of Exercise Prescription  Yes       Intervention  Provide education, explanation, and written materials on patient's individual exercise prescription       Expected Outcomes  Short Term: Able to explain program exercise prescription;Long Term: Able to explain home exercise prescription to exercise independently          Exercise Goals Re-Evaluation: Exercise Goals Re-Evaluation    Row Name 04/26/19 1020 05/09/19 1401 06/01/19 1314 07/06/19 1445 07/17/19 1149     Exercise Goal Re-Evaluation   Exercise Goals Review  Increase Physical Activity;Increase Strength and Stamina;Able to understand and use rate of perceived exertion (RPE) scale;Knowledge and understanding of Target Heart Rate Range (THRR);Understanding of Exercise Prescription  Increase Physical Activity;Increase Strength and Stamina;Able to understand and use rate of perceived exertion (RPE) scale;Knowledge and understanding of Target Heart Rate Range (THRR);Able to check pulse  independently;Understanding of Exercise Prescription  Increase Physical Activity;Able to understand and use rate of perceived exertion (RPE) scale;Increase Strength and Stamina;Knowledge and understanding of Target Heart Rate Range (THRR);Able to check pulse independently;Understanding of Exercise Prescription  Increase Physical Activity;Able to understand and use rate of perceived exertion (RPE) scale;Increase Strength and Stamina;Knowledge and understanding of Target Heart Rate Range (THRR);Able to check pulse independently;Understanding of Exercise Prescription  Increase Physical Activity;Increase Strength and Stamina;Able to understand and use rate of perceived exertion (RPE) scale;Knowledge and understanding of Target Heart Rate Range (THRR);Able to check pulse independently;Understanding of Exercise Prescription   Comments  Pt first day of exercise in CR program. Pt tolerated exercise well and understand THRR, RPE scale, cool down stretches, and exercise Rx.  Reviewed METs and goals with Pt. Pt is porgressing well and has a MET level of 2.0. Pt stated his goals was to start walking 30-45 minutes 5-7 days per week in addition to CR program.  Pt is progressing well and has a MET level of 2.6.  Pt completed 14 sessions. Pt was offered virtual CR program due to department closure for [place]. Pt wants to participate in virutal CR program and is now active on the app. Pt will start logging his exercise sessions on the app. Pt was encouraged to exercise 5-7 days for 30-45 minutes each day.  Patient has been actively participating in the virtual cardiac rehab program via the [employer] app and has been progressing well. Patient is riding his stationary bike and walking 15-20 minutes, 4-6 days/week.  Pt first day returning to exercise since COVID 19 closure. Pt tolerated exercise Rx well. Pt is now using the treadmill instead of rec bike during his exercise session. Pt tolerated treadmill well.   Expected Outcomes   Will continue to monitor and progress Pt as tolerated.  Will continue to monitor and progress Pt as tolerated.  Pt will continue exercising at home.  Patient will transition back to the onsite cardiac rehab program.  Will continue to monitor and progress Pt as tolerated.   [place] Name 08/07/19 1026             Exercise Goal Re-Evaluation   Exercise Goals Review  Increase Physical Activity;Increase Strength and Stamina;Able to understand and use rate of perceived exertion (RPE) scale;Knowledge and understanding of Target Heart Rate Range (THRR);Able to check pulse independently;Understanding of Exercise Prescription       Comments  Pt graduated CR program today. Pt progressed well in the program by increasing his METs and levels, participating in virtual CR program, and ended with a MET level of 2.7. Pt stated he has learned a lot from the program and feels that the program has helped him reain strength. Pt stated he plans to continue to exercise at home by walking and using the stationary bike 5-7 days per week for 30-45 minutes.       Expected Outcomes  Pt will continue exercising on his own at home.          Nutrition & Weight - Outcomes:  Post Biometrics - 08/07/19 1030       Post  Biometrics   Height  5' 9.5" (1.765 m)    Weight  80.1 kg    Waist Circumference  40 inches    Hip Circumference  37.75 inches    Waist to Hip Ratio  1.06 %    BMI (Calculated)  25.71    Triceps Skinfold  12 mm    % Body Fat  25.9 %    Grip Strength  42.5 kg    Flexibility  0 in    Single Leg Stand  4.75 seconds       Nutrition: Nutrition Therapy & Goals - 05/03/19 1506      Nutrition Therapy   Diet  Heart Healthy      Personal Nutrition Goals   Nutrition Goal  Pt to identify and limit food sources of saturated fat, trans fat, refined carbohydrates and sodium    Personal Goal #2  Improved blood glucose control as evidenced by pt's A1c trending from 7.1 toward less than 5.6.    Personal Goal #3   Pt able to name foods that affect blood glucose      Intervention Plan   Intervention  Prescribe, educate and counsel regarding individualized specific dietary modifications aiming towards targeted core components such as weight, hypertension, lipid management, diabetes, heart failure and other comorbidities.;Nutrition handout(s) given to patient.    Expected Outcomes  Short Term Goal: Understand basic principles of  dietary content, such as calories, fat, sodium, cholesterol and nutrients.;Short Term Goal: A plan has been developed with personal nutrition goals set during dietitian appointment.;Long Term Goal: Adherence to prescribed nutrition plan.       Nutrition Discharge: Nutrition Assessments - 08/07/19 1536      MEDFICTS Scores   Post Score  42       Education Questionnaire Score: Knowledge Questionnaire Score - 08/07/19 1029      Knowledge Questionnaire Score   Post Score  18/24       Goals reviewed with patient; copy given to patient.

## 2019-08-15 DIAGNOSIS — E1159 Type 2 diabetes mellitus with other circulatory complications: Secondary | ICD-10-CM | POA: Diagnosis not present

## 2019-08-15 DIAGNOSIS — E1121 Type 2 diabetes mellitus with diabetic nephropathy: Secondary | ICD-10-CM | POA: Diagnosis not present

## 2019-08-15 DIAGNOSIS — H409 Unspecified glaucoma: Secondary | ICD-10-CM | POA: Diagnosis not present

## 2019-08-15 DIAGNOSIS — N1831 Chronic kidney disease, stage 3a: Secondary | ICD-10-CM | POA: Diagnosis not present

## 2019-08-15 DIAGNOSIS — N400 Enlarged prostate without lower urinary tract symptoms: Secondary | ICD-10-CM | POA: Diagnosis not present

## 2019-08-15 DIAGNOSIS — I10 Essential (primary) hypertension: Secondary | ICD-10-CM | POA: Diagnosis not present

## 2019-08-15 DIAGNOSIS — E785 Hyperlipidemia, unspecified: Secondary | ICD-10-CM | POA: Diagnosis not present

## 2019-08-15 DIAGNOSIS — E1122 Type 2 diabetes mellitus with diabetic chronic kidney disease: Secondary | ICD-10-CM | POA: Diagnosis not present

## 2019-09-26 DIAGNOSIS — C61 Malignant neoplasm of prostate: Secondary | ICD-10-CM | POA: Diagnosis not present

## 2019-10-03 DIAGNOSIS — N5201 Erectile dysfunction due to arterial insufficiency: Secondary | ICD-10-CM | POA: Diagnosis not present

## 2019-10-03 DIAGNOSIS — R338 Other retention of urine: Secondary | ICD-10-CM | POA: Diagnosis not present

## 2019-10-03 DIAGNOSIS — C61 Malignant neoplasm of prostate: Secondary | ICD-10-CM | POA: Diagnosis not present

## 2019-10-19 DIAGNOSIS — E1159 Type 2 diabetes mellitus with other circulatory complications: Secondary | ICD-10-CM | POA: Diagnosis not present

## 2019-10-19 DIAGNOSIS — N400 Enlarged prostate without lower urinary tract symptoms: Secondary | ICD-10-CM | POA: Diagnosis not present

## 2019-10-19 DIAGNOSIS — E1121 Type 2 diabetes mellitus with diabetic nephropathy: Secondary | ICD-10-CM | POA: Diagnosis not present

## 2019-10-19 DIAGNOSIS — E785 Hyperlipidemia, unspecified: Secondary | ICD-10-CM | POA: Diagnosis not present

## 2019-10-19 DIAGNOSIS — N1831 Chronic kidney disease, stage 3a: Secondary | ICD-10-CM | POA: Diagnosis not present

## 2019-10-19 DIAGNOSIS — I10 Essential (primary) hypertension: Secondary | ICD-10-CM | POA: Diagnosis not present

## 2019-10-19 DIAGNOSIS — E1122 Type 2 diabetes mellitus with diabetic chronic kidney disease: Secondary | ICD-10-CM | POA: Diagnosis not present

## 2019-10-19 DIAGNOSIS — H409 Unspecified glaucoma: Secondary | ICD-10-CM | POA: Diagnosis not present

## 2020-01-11 DIAGNOSIS — R8279 Other abnormal findings on microbiological examination of urine: Secondary | ICD-10-CM | POA: Diagnosis not present

## 2020-01-11 DIAGNOSIS — R351 Nocturia: Secondary | ICD-10-CM | POA: Diagnosis not present

## 2020-03-12 ENCOUNTER — Ambulatory Visit: Payer: PPO | Attending: Internal Medicine

## 2020-03-12 DIAGNOSIS — Z23 Encounter for immunization: Secondary | ICD-10-CM

## 2020-03-12 NOTE — Progress Notes (Signed)
   Covid-19 Vaccination Clinic  Name:  Jeremy Nicholson    MRN: [id number] DOB: 04-Jul-1945  03/12/2020  Jeremy Nicholson was observed post Covid-19 immunization for 15 minutes without incident. He was provided with Vaccine Information Sheet and instruction to access the V-Safe system.   Jeremy Nicholson was instructed to call 911 with any severe reactions post vaccine: Marland Kitchen Difficulty breathing  . Swelling of face and throat  . A fast heartbeat  . A bad rash all over body  . Dizziness and weakness

## 2020-03-26 DIAGNOSIS — E785 Hyperlipidemia, unspecified: Secondary | ICD-10-CM | POA: Diagnosis not present

## 2020-03-26 DIAGNOSIS — N521 Erectile dysfunction due to diseases classified elsewhere: Secondary | ICD-10-CM | POA: Diagnosis not present

## 2020-03-26 DIAGNOSIS — E1159 Type 2 diabetes mellitus with other circulatory complications: Secondary | ICD-10-CM | POA: Diagnosis not present

## 2020-03-26 DIAGNOSIS — Z0000 Encounter for general adult medical examination without abnormal findings: Secondary | ICD-10-CM | POA: Diagnosis not present

## 2020-03-26 DIAGNOSIS — Z008 Encounter for other general examination: Secondary | ICD-10-CM | POA: Diagnosis not present

## 2020-03-26 DIAGNOSIS — Z6827 Body mass index (BMI) 27.0-27.9, adult: Secondary | ICD-10-CM | POA: Diagnosis not present

## 2020-03-26 DIAGNOSIS — E663 Overweight: Secondary | ICD-10-CM | POA: Diagnosis not present

## 2020-03-26 DIAGNOSIS — I10 Essential (primary) hypertension: Secondary | ICD-10-CM | POA: Diagnosis not present

## 2020-03-26 DIAGNOSIS — N400 Enlarged prostate without lower urinary tract symptoms: Secondary | ICD-10-CM | POA: Diagnosis not present

## 2020-03-28 DIAGNOSIS — C61 Malignant neoplasm of prostate: Secondary | ICD-10-CM | POA: Diagnosis not present

## 2020-04-01 DIAGNOSIS — R338 Other retention of urine: Secondary | ICD-10-CM | POA: Diagnosis not present

## 2020-04-01 DIAGNOSIS — C61 Malignant neoplasm of prostate: Secondary | ICD-10-CM | POA: Diagnosis not present

## 2020-04-01 DIAGNOSIS — N202 Calculus of kidney with calculus of ureter: Secondary | ICD-10-CM | POA: Diagnosis not present

## 2020-04-01 DIAGNOSIS — N5201 Erectile dysfunction due to arterial insufficiency: Secondary | ICD-10-CM | POA: Diagnosis not present

## 2020-04-10 DIAGNOSIS — E785 Hyperlipidemia, unspecified: Secondary | ICD-10-CM | POA: Diagnosis not present

## 2020-04-10 DIAGNOSIS — N1831 Chronic kidney disease, stage 3a: Secondary | ICD-10-CM | POA: Diagnosis not present

## 2020-04-10 DIAGNOSIS — C61 Malignant neoplasm of prostate: Secondary | ICD-10-CM | POA: Diagnosis not present

## 2020-04-10 DIAGNOSIS — I700 Atherosclerosis of aorta: Secondary | ICD-10-CM | POA: Diagnosis not present

## 2020-04-10 DIAGNOSIS — E1122 Type 2 diabetes mellitus with diabetic chronic kidney disease: Secondary | ICD-10-CM | POA: Diagnosis not present

## 2020-04-10 DIAGNOSIS — I739 Peripheral vascular disease, unspecified: Secondary | ICD-10-CM | POA: Diagnosis not present

## 2020-04-10 DIAGNOSIS — Z0000 Encounter for general adult medical examination without abnormal findings: Secondary | ICD-10-CM | POA: Diagnosis not present

## 2020-04-10 DIAGNOSIS — H409 Unspecified glaucoma: Secondary | ICD-10-CM | POA: Diagnosis not present

## 2020-04-10 DIAGNOSIS — Z1389 Encounter for screening for other disorder: Secondary | ICD-10-CM | POA: Diagnosis not present

## 2020-04-10 DIAGNOSIS — Z1159 Encounter for screening for other viral diseases: Secondary | ICD-10-CM | POA: Diagnosis not present

## 2020-04-10 DIAGNOSIS — I10 Essential (primary) hypertension: Secondary | ICD-10-CM | POA: Diagnosis not present

## 2020-04-20 DIAGNOSIS — N5201 Erectile dysfunction due to arterial insufficiency: Secondary | ICD-10-CM | POA: Diagnosis not present

## 2020-04-20 DIAGNOSIS — C61 Malignant neoplasm of prostate: Secondary | ICD-10-CM | POA: Diagnosis not present

## 2020-05-29 DIAGNOSIS — I10 Essential (primary) hypertension: Secondary | ICD-10-CM | POA: Diagnosis not present

## 2020-05-29 DIAGNOSIS — E1121 Type 2 diabetes mellitus with diabetic nephropathy: Secondary | ICD-10-CM | POA: Diagnosis not present

## 2020-05-29 DIAGNOSIS — N1831 Chronic kidney disease, stage 3a: Secondary | ICD-10-CM | POA: Diagnosis not present

## 2020-05-29 DIAGNOSIS — E785 Hyperlipidemia, unspecified: Secondary | ICD-10-CM | POA: Diagnosis not present

## 2020-05-29 DIAGNOSIS — C61 Malignant neoplasm of prostate: Secondary | ICD-10-CM | POA: Diagnosis not present

## 2020-05-29 DIAGNOSIS — H409 Unspecified glaucoma: Secondary | ICD-10-CM | POA: Diagnosis not present

## 2020-05-29 DIAGNOSIS — N400 Enlarged prostate without lower urinary tract symptoms: Secondary | ICD-10-CM | POA: Diagnosis not present

## 2020-05-29 DIAGNOSIS — E1159 Type 2 diabetes mellitus with other circulatory complications: Secondary | ICD-10-CM | POA: Diagnosis not present

## 2020-08-06 DIAGNOSIS — N400 Enlarged prostate without lower urinary tract symptoms: Secondary | ICD-10-CM | POA: Diagnosis not present

## 2020-08-06 DIAGNOSIS — E1159 Type 2 diabetes mellitus with other circulatory complications: Secondary | ICD-10-CM | POA: Diagnosis not present

## 2020-08-06 DIAGNOSIS — E1122 Type 2 diabetes mellitus with diabetic chronic kidney disease: Secondary | ICD-10-CM | POA: Diagnosis not present

## 2020-08-06 DIAGNOSIS — N1831 Chronic kidney disease, stage 3a: Secondary | ICD-10-CM | POA: Diagnosis not present

## 2020-08-06 DIAGNOSIS — E1121 Type 2 diabetes mellitus with diabetic nephropathy: Secondary | ICD-10-CM | POA: Diagnosis not present

## 2020-08-06 DIAGNOSIS — E785 Hyperlipidemia, unspecified: Secondary | ICD-10-CM | POA: Diagnosis not present

## 2020-08-06 DIAGNOSIS — I10 Essential (primary) hypertension: Secondary | ICD-10-CM | POA: Diagnosis not present

## 2020-08-06 DIAGNOSIS — C61 Malignant neoplasm of prostate: Secondary | ICD-10-CM | POA: Diagnosis not present

## 2020-08-06 DIAGNOSIS — H409 Unspecified glaucoma: Secondary | ICD-10-CM | POA: Diagnosis not present

## 2020-08-29 ENCOUNTER — Other Ambulatory Visit: Payer: Self-pay | Admitting: Urology

## 2020-08-29 DIAGNOSIS — C61 Malignant neoplasm of prostate: Secondary | ICD-10-CM

## 2020-09-26 ENCOUNTER — Ambulatory Visit
Admission: RE | Admit: 2020-09-26 | Discharge: 2020-09-26 | Disposition: A | Discharge status: Home / Self Care | Payer: Medicare HMO | Source: Ambulatory Visit | Attending: Urology | Admitting: Urology

## 2020-09-26 ENCOUNTER — Other Ambulatory Visit: Payer: Self-pay

## 2020-09-26 DIAGNOSIS — N401 Enlarged prostate with lower urinary tract symptoms: Secondary | ICD-10-CM | POA: Diagnosis not present

## 2020-09-26 DIAGNOSIS — K5730 Diverticulosis of large intestine without perforation or abscess without bleeding: Secondary | ICD-10-CM | POA: Diagnosis not present

## 2020-09-26 DIAGNOSIS — C61 Malignant neoplasm of prostate: Secondary | ICD-10-CM

## 2020-09-26 MED ORDER — GADOBENATE DIMEGLUMINE 529 MG/ML IV SOLN
17.0000 mL | Freq: Once | INTRAVENOUS | Status: AC | PRN
  Administered 2020-09-26: 17 mL via INTRAVENOUS

## 2020-10-02 DIAGNOSIS — E1121 Type 2 diabetes mellitus with diabetic nephropathy: Secondary | ICD-10-CM | POA: Diagnosis not present

## 2020-10-02 DIAGNOSIS — I10 Essential (primary) hypertension: Secondary | ICD-10-CM | POA: Diagnosis not present

## 2020-10-02 DIAGNOSIS — E785 Hyperlipidemia, unspecified: Secondary | ICD-10-CM | POA: Diagnosis not present

## 2020-10-02 DIAGNOSIS — E1159 Type 2 diabetes mellitus with other circulatory complications: Secondary | ICD-10-CM | POA: Diagnosis not present

## 2020-10-02 DIAGNOSIS — C61 Malignant neoplasm of prostate: Secondary | ICD-10-CM | POA: Diagnosis not present

## 2020-10-02 DIAGNOSIS — N400 Enlarged prostate without lower urinary tract symptoms: Secondary | ICD-10-CM | POA: Diagnosis not present

## 2020-10-02 DIAGNOSIS — H409 Unspecified glaucoma: Secondary | ICD-10-CM | POA: Diagnosis not present

## 2020-10-02 DIAGNOSIS — N1831 Chronic kidney disease, stage 3a: Secondary | ICD-10-CM | POA: Diagnosis not present

## 2020-10-02 DIAGNOSIS — E1122 Type 2 diabetes mellitus with diabetic chronic kidney disease: Secondary | ICD-10-CM | POA: Diagnosis not present

## 2020-10-09 DIAGNOSIS — M25571 Pain in right ankle and joints of right foot: Secondary | ICD-10-CM | POA: Diagnosis not present

## 2020-10-14 DIAGNOSIS — C61 Malignant neoplasm of prostate: Secondary | ICD-10-CM | POA: Diagnosis not present

## 2020-10-15 ENCOUNTER — Ambulatory Visit (INDEPENDENT_AMBULATORY_CARE_PROVIDER_SITE_OTHER): Payer: Medicare HMO | Admitting: Podiatry

## 2020-10-15 ENCOUNTER — Ambulatory Visit: Payer: Medicare HMO

## 2020-10-15 ENCOUNTER — Other Ambulatory Visit: Payer: Self-pay

## 2020-10-15 DIAGNOSIS — M7751 Other enthesopathy of right foot: Secondary | ICD-10-CM

## 2020-10-15 DIAGNOSIS — M25571 Pain in right ankle and joints of right foot: Secondary | ICD-10-CM

## 2020-10-16 ENCOUNTER — Encounter: Payer: Self-pay | Admitting: Podiatry

## 2020-10-16 NOTE — Progress Notes (Signed)
Subjective:  Patient ID: Jeremy Nicholson, male    DOB: 06/14/1945,  MRN: [id number]  Chief Complaint  Patient presents with  . Foot Pain    Right ankle pain     75 y.o. male presents with the above complaint.  Patient presents with right ankle pain especially when walking.  There are some swelling associated with it.  Patient states that it is painful to walk has progressed to gotten worse and has been going on for a year.  He states it is painful to walk on it.  Is dull achy in nature pain scale 7 out of 10 he has not seen anyone else prior to seeing me he has not tried any other treatment options.   Review of Systems: Negative except as noted in the HPI. Denies N/V/F/Ch.  Past Medical History:  Diagnosis Date  . Aortic valve stenosis, moderate    per echo 06-02-2017 (in epic)  ef 55-60%,  possible bicuspid AV,  severeely thickened and calcified with severely restricted leaflet opening,  valve area 1.17cm^2, mean gradient 72mmHg, peak gradient 25mmHg,  moderate AR  . Arthritis    oa  . BPH associated with nocturia   . Complication of anesthesia    due to heart murmur  mod to severe aortic stenosis  . Full dentures   . Glaucoma, both eyes   . Heart murmur, systolic   . Hiatal hernia   . Hypertension   . Left ureteral stone   . Myocardial infarction ([place])    mild in 11-2017 at [facility] F/U from [place] on chart  . SIRS (systemic inflammatory response syndrome) ([place]) 05/31/2017   hospital admission--  unknown etiology  . Type 2 diabetes mellitus ([place])   . Wears glasses     Current Outpatient Medications:  .  acetaminophen (TYLENOL) 500 MG tablet, Take 1,000 mg by mouth every 6 (six) hours as needed for moderate pain. , Disp: , Rfl:  .  amLODipine (NORVASC) 10 MG tablet, Take 10 mg by mouth every morning. , Disp: , Rfl:  .  atorvastatin (LIPITOR) 40 MG tablet, Take 40 mg by mouth every evening. , Disp: , Rfl: 2 .  betamethasone, augmented, (DIPROLENE) 0.05 % lotion,  betamethasone, augmented 0.05 % lotion, Disp: , Rfl:  .  bisacodyl (BISACODYL) 5 MG EC tablet, Take 5 mg by mouth daily as needed for moderate constipation., Disp: , Rfl:  .  Brinzolamide-Brimonidine 1-0.2 % SUSP, Place 1 drop into both eyes 2 (two) times daily. , Disp: , Rfl:  .  cephALEXin (KEFLEX) 500 MG capsule, Take 1 capsule (500 mg total) by mouth 2 (two) times daily. X 3 days to prevent post-op infection (Patient not taking: Reported on 04/17/2019), Disp: 6 capsule, Rfl: 0 .  Cholecalciferol 1000 units tablet, Take 1,000 Units by mouth every morning. , Disp: , Rfl:  .  diclofenac sodium (VOLTAREN) 1 % GEL, Apply 2 g topically daily as needed (knee pain)., Disp: , Rfl:  .  finasteride (PROSCAR) 5 MG tablet, Take 5 mg by mouth every morning. , Disp: , Rfl: 3 .  furosemide (LASIX) 40 MG tablet, Take 40 mg by mouth every morning., Disp: , Rfl:  .  insulin glargine (LANTUS) 100 UNIT/ML injection, Inject 8 Units into the skin at bedtime. , Disp: , Rfl:  .  ipratropium (ATROVENT) 0.06 % nasal spray, Place into the nose., Disp: , Rfl:  .  latanoprost (XALATAN) 0.005 % ophthalmic solution, Place 1 drop into both eyes at bedtime. ,  Disp: , Rfl:  .  lisinopril (ZESTRIL) 5 MG tablet, Take by mouth., Disp: , Rfl:  .  metFORMIN (GLUCOPHAGE) 500 MG tablet, Take 1,000 mg by mouth 2 (two) times daily with a meal. , Disp: , Rfl:  .  metoprolol tartrate (LOPRESSOR) 50 MG tablet, Take 50 mg by mouth 2 (two) times daily. , Disp: , Rfl:  .  niacin (SLO-NIACIN) 500 MG tablet, Take 500 mg by mouth every morning. , Disp: , Rfl:  .  oxyCODONE (OXY IR/ROXICODONE) 5 MG immediate release tablet, , Disp: , Rfl:  .  potassium chloride SA (K-DUR,KLOR-CON) 20 MEQ tablet, Take 20 mEq by mouth every morning., Disp: , Rfl:  .  Semaglutide,0.25 or 0.5MG /DOS, 2 MG/1.5ML SOPN, Inject 0.5 mg into the skin once a week. , Disp: , Rfl:  .  sildenafil (VIAGRA) 100 MG tablet, TAKE ONE TABLET BY MOUTH APPROXIMATELY ONE HOUR BEFORE  SEXUAL ACTIVITY., Disp: , Rfl:  .  tamsulosin (FLOMAX) 0.4 MG CAPS capsule, Take 0.4 mg by mouth at bedtime. , Disp: , Rfl:  .  traMADol (ULTRAM) 50 MG tablet, Take 1-2 tablets (50-100 mg total) by mouth every 6 (six) hours as needed for moderate pain or severe pain. Post-operatively (Patient not taking: Reported on 04/17/2019), Disp: 20 tablet, Rfl: 0  Social History   Tobacco Use  Smoking Status Former Smoker  . Years: 30.00  . Types: Cigarettes  . Quit date: 01/28/1995  . Years since quitting: 25.7  Smokeless Tobacco Never Used    Allergies  Allergen Reactions  . Chlorthalidone Other (See Comments)  . Latex Hives  . Lisinopril Hives  . Tape Hives   Objective:  There were no vitals filed for this visit. There is no height or weight on file to calculate BMI. Constitutional Well developed. Well nourished.  Vascular Dorsalis pedis pulses palpable bilaterally. Posterior tibial pulses palpable bilaterally. Capillary refill normal to all digits.  No cyanosis or clubbing noted. Pedal hair growth normal.  Neurologic Normal speech. Oriented to person, place, and time. Epicritic sensation to light touch grossly present bilaterally.  Dermatologic Nails well groomed and normal in appearance. No open wounds. No skin lesions.  Orthopedic:  Pain on palpation to the right ankle medial gutter.  Pain with range of motion of the ankle joint.  Deep intra-articular pain noted.  No pain at the Achilles tendon, peroneal tendon.  Generalized circumferential ankle pain noted at the ATFL ligament as well as medial gutter as well.   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  Posterior and plantar heel spurring noted.  Midfoot arthritis noted.  No other bony abnormalities identified. Assessment:   1. Capsulitis of ankle, right    Plan:  Patient was evaluated and treated and all questions answered.  Bilateral  ankle capsulitis/generalized ankle pain -I explained the patient the etiology of ankle pain and various treatment options were discussed.  Given that patient has mild to moderate osteoarthritic changes to the ankle joint is likely leading to inflammation of the joint and the pain around it.  Given that it is more chronic and has progressively spread circumferential around the ankle joint I believe patient will benefit from cam boot immobilization.  If the immobilization completely gets rid of his pain then I will have him transition out of the cam boot.  If there is still residual pain will discuss steroid injection under Ankle visit.  Patient states understanding. -Cam boot was  dispensed  No follow-ups on file.

## 2020-10-21 DIAGNOSIS — N5201 Erectile dysfunction due to arterial insufficiency: Secondary | ICD-10-CM | POA: Diagnosis not present

## 2020-10-21 DIAGNOSIS — C61 Malignant neoplasm of prostate: Secondary | ICD-10-CM | POA: Diagnosis not present

## 2020-10-21 DIAGNOSIS — R338 Other retention of urine: Secondary | ICD-10-CM | POA: Diagnosis not present

## 2020-11-15 ENCOUNTER — Other Ambulatory Visit: Payer: Self-pay

## 2020-11-15 ENCOUNTER — Ambulatory Visit (INDEPENDENT_AMBULATORY_CARE_PROVIDER_SITE_OTHER): Payer: Medicare HMO | Admitting: Podiatry

## 2020-11-15 DIAGNOSIS — IMO0001 Reserved for inherently not codable concepts without codable children: Secondary | ICD-10-CM | POA: Insufficient documentation

## 2020-11-15 DIAGNOSIS — Z7189 Other specified counseling: Secondary | ICD-10-CM | POA: Insufficient documentation

## 2020-11-15 DIAGNOSIS — J309 Allergic rhinitis, unspecified: Secondary | ICD-10-CM | POA: Insufficient documentation

## 2020-11-15 DIAGNOSIS — Q828 Other specified congenital malformations of skin: Secondary | ICD-10-CM | POA: Diagnosis not present

## 2020-11-15 DIAGNOSIS — M7751 Other enthesopathy of right foot: Secondary | ICD-10-CM

## 2020-11-15 DIAGNOSIS — E1121 Type 2 diabetes mellitus with diabetic nephropathy: Secondary | ICD-10-CM | POA: Insufficient documentation

## 2020-11-15 DIAGNOSIS — N400 Enlarged prostate without lower urinary tract symptoms: Secondary | ICD-10-CM | POA: Insufficient documentation

## 2020-11-15 DIAGNOSIS — M778 Other enthesopathies, not elsewhere classified: Secondary | ICD-10-CM | POA: Diagnosis not present

## 2020-11-15 DIAGNOSIS — H4010X2 Unspecified open-angle glaucoma, moderate stage: Secondary | ICD-10-CM | POA: Insufficient documentation

## 2020-11-15 DIAGNOSIS — K219 Gastro-esophageal reflux disease without esophagitis: Secondary | ICD-10-CM | POA: Insufficient documentation

## 2020-11-15 DIAGNOSIS — N189 Chronic kidney disease, unspecified: Secondary | ICD-10-CM | POA: Insufficient documentation

## 2020-11-15 DIAGNOSIS — I259 Chronic ischemic heart disease, unspecified: Secondary | ICD-10-CM | POA: Insufficient documentation

## 2020-11-15 DIAGNOSIS — R030 Elevated blood-pressure reading, without diagnosis of hypertension: Secondary | ICD-10-CM | POA: Insufficient documentation

## 2020-11-20 ENCOUNTER — Encounter: Payer: Self-pay | Admitting: Podiatry

## 2020-11-20 NOTE — Progress Notes (Signed)
Subjective:  Patient ID: Jeremy Nicholson, male    DOB: [birth year],  MRN: [id number]  Chief Complaint  Patient presents with   Ankle Pain    Pt unsure if ankle is healing   Foot Problem    Right plantar foot painful lesion sharp shooting pain    75 y.o. male presents with the above complaint.  Patient presents for follow-up of right ankle pain.  Patient states he is doing a lot better.  Cam boot immobilization help considerably.  Now he has right heel pain that has been going on for quite some time is progressive gotten worse.  He would like to discuss treatment options for it.   Review of Systems: Negative except as noted in the HPI. Denies N/V/F/Ch.  Past Medical History:  Diagnosis Date   Aortic valve stenosis, moderate    per echo 06-02-2017 (in epic)  ef 55-60%,  possible bicuspid AV,  severeely thickened and calcified with severely restricted leaflet opening,  valve area 1.17cm^2, mean gradient 72mmHg, peak gradient 52mmHg,  moderate AR   Arthritis    oa   BPH associated with nocturia    Complication of anesthesia    due to heart murmur  mod to severe aortic stenosis   Full dentures    Glaucoma, both eyes    Heart murmur, systolic    Hiatal hernia    Hypertension    Left ureteral stone    Myocardial infarction ([place])    mild in 11-2017 at [facility] F/U from [place] on chart   SIRS (systemic inflammatory response syndrome) ([place]) 05/31/2017   hospital admission--  unknown etiology   Type 2 diabetes mellitus ([place])    Wears glasses     Current Outpatient Medications:    acetaminophen (TYLENOL) 500 MG tablet, Take 1,000 mg by mouth every 6 (six) hours as needed for moderate pain. , Disp: , Rfl:    amLODipine (NORVASC) 10 MG tablet, Take 10 mg by mouth every morning. , Disp: , Rfl:    aspirin 81 MG chewable tablet, Chew by mouth., Disp: , Rfl:    atorvastatin (LIPITOR) 40 MG tablet, Take 40 mg by mouth every evening. , Disp: , Rfl: 2   betamethasone, augmented, (DIPROLENE)  0.05 % lotion, betamethasone, augmented 0.05 % lotion, Disp: , Rfl:    brimonidine (ALPHAGAN) 0.2 % ophthalmic solution, 1 drop into affected eye, Disp: , Rfl:    Brinzolamide-Brimonidine 1-0.2 % SUSP, Place 1 drop into both eyes 2 (two) times daily. , Disp: , Rfl:    carvedilol (COREG) 25 MG tablet, Take 0.5 tablets by mouth 2 (two) times daily., Disp: , Rfl:    cephALEXin (KEFLEX) 500 MG capsule, Take 1 capsule (500 mg total) by mouth 2 (two) times daily. X 3 days to prevent post-op infection, Disp: 6 capsule, Rfl: 0   cetirizine (ZYRTEC) 10 MG tablet, Take by mouth., Disp: , Rfl:    cholecalciferol (VITAMIN D3) 25 MCG (1000 UNIT) tablet, 1 tablet, Disp: , Rfl:    Cholecalciferol 1000 units tablet, Take 1,000 Units by mouth every morning. , Disp: , Rfl:    diclofenac sodium (VOLTAREN) 1 % GEL, Apply 2 g topically daily as needed (knee pain)., Disp: , Rfl:    diclofenac Sodium (VOLTAREN) 1 % GEL, See admin instructions., Disp: , Rfl:    finasteride (PROSCAR) 5 MG tablet, Take 5 mg by mouth every morning. , Disp: , Rfl: 3   finasteride (PROSCAR) 5 MG tablet, Take 1 tablet by mouth daily.,  Disp: , Rfl:    furosemide (LASIX) 40 MG tablet, Take 40 mg by mouth every morning., Disp: , Rfl:    insulin glargine (LANTUS) 100 UNIT/ML injection, Inject 8 Units into the skin at bedtime. , Disp: , Rfl:    Insulin Glargine-yfgn 100 UNIT/ML SOPN, Inject into the skin., Disp: , Rfl:    ipratropium (ATROVENT) 0.06 % nasal spray, Place into the nose., Disp: , Rfl:    latanoprost (XALATAN) 0.005 % ophthalmic solution, Place 1 drop into both eyes at bedtime. , Disp: , Rfl:    latanoprost (XALATAN) 0.005 % ophthalmic solution, Apply to eye., Disp: , Rfl:    lisinopril (ZESTRIL) 10 MG tablet, Take 1 tablet by mouth daily., Disp: , Rfl:    lisinopril (ZESTRIL) 5 MG tablet, Take by mouth., Disp: , Rfl:    lisinopril (ZESTRIL) 5 MG tablet, 2 tablets, Disp: , Rfl:    loratadine (CLARITIN) 10 MG tablet, 1 tablet, Disp: ,  Rfl:    metFORMIN (GLUCOPHAGE) 500 MG tablet, Take 1,000 mg by mouth 2 (two) times daily with a meal. , Disp: , Rfl:    metFORMIN (GLUCOPHAGE) 500 MG tablet, 1 tablet, Disp: , Rfl:    methocarbamol (ROBAXIN) 500 MG tablet, 1.5 tablets, Disp: , Rfl:    metoprolol tartrate (LOPRESSOR) 50 MG tablet, Take 50 mg by mouth 2 (two) times daily. , Disp: , Rfl:    niacin (SLO-NIACIN) 500 MG tablet, Take 500 mg by mouth every morning. , Disp: , Rfl:    omeprazole (PRILOSEC) 20 MG capsule, Take by mouth., Disp: , Rfl:    oxyCODONE (OXY IR/ROXICODONE) 5 MG immediate release tablet, , Disp: , Rfl:    potassium chloride SA (K-DUR,KLOR-CON) 20 MEQ tablet, Take 20 mEq by mouth every morning., Disp: , Rfl:    Semaglutide,0.25 or 0.5MG /DOS, 2 MG/1.5ML SOPN, Inject 0.5 mg into the skin once a week. , Disp: , Rfl:    sildenafil (VIAGRA) 100 MG tablet, TAKE ONE TABLET BY MOUTH APPROXIMATELY ONE HOUR BEFORE SEXUAL ACTIVITY., Disp: , Rfl:    tamsulosin (FLOMAX) 0.4 MG CAPS capsule, Take 0.4 mg by mouth at bedtime. , Disp: , Rfl:    traMADol (ULTRAM) 50 MG tablet, Take 1-2 tablets (50-100 mg total) by mouth every 6 (six) hours as needed for moderate pain or severe pain. Post-operatively, Disp: 20 tablet, Rfl: 0   triamcinolone (NASACORT) 55 MCG/ACT AERO nasal inhaler, 0, Disp: , Rfl:   Social History   Tobacco Use  Smoking Status Former   Years: 30.00   Pack years: 0.00   Types: Cigarettes   Quit date: 01/28/1995   Years since quitting: 25.8  Smokeless Tobacco Never    Allergies  Allergen Reactions   Atorvastatin Other (See Comments)   Chlorthalidone Other (See Comments)   Pravastatin Other (See Comments)   Latex Hives   Lisinopril Hives   Rosuvastatin Other (See Comments)   Tape Hives   Objective:  There were no vitals filed for this visit. There is no height or weight on file to calculate BMI. Constitutional Well developed. Well nourished.  Vascular Dorsalis pedis pulses palpable  bilaterally. Posterior tibial pulses palpable bilaterally. Capillary refill normal to all digits.  No cyanosis or clubbing noted. Pedal hair growth normal.  Neurologic Normal speech. Oriented to person, place, and time. Epicritic sensation to light touch grossly present bilaterally.  Dermatologic Nails well groomed and normal in appearance. No open wounds. No skin lesions.  Orthopedic: No further pain on palpation to the  right ankle medial gutter.  No further pain with range of motion of the ankle joint.  No deep intra-articular pain noted.  No pain at the Achilles tendon, peroneal tendon.    Pain on palpation to the right heel.  Porokeratotic lesion noted with central nucleated core.  Pain on palpation to the lesion.   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  Posterior and plantar heel spurring noted.  Midfoot arthritis noted.  No other bony abnormalities identified. Assessment:   1. Capsulitis of right foot   2. Porokeratosis   3. Capsulitis of ankle, right     Plan:  Patient was evaluated and treated and all questions answered.  Bilateral ankle capsulitis/generalized ankle pain -Clinically doing a lot better.  Steroid injection helped as well as cam boot immobilization help with both capsulitis.  Right heel porokeratotic lesion with underlying capsulitis -I explained the patient the etiology of porokeratosis and various treatment options were discussed.  Given the amount of pain that is having I believe patient will benefit from a steroid injection of decrease acute inflammatory component associate with pain followed by debridement of the lesion.  Patient agrees with plan like to proceed with that. -A steroid injection was performed at right plantar heel using 1% plain Lidocaine and at point of maximal tenderness mg of Kenalog. This was well tolerated.   No follow-ups on file.

## 2020-12-27 ENCOUNTER — Ambulatory Visit (INDEPENDENT_AMBULATORY_CARE_PROVIDER_SITE_OTHER): Payer: Medicare HMO | Admitting: Podiatry

## 2020-12-27 ENCOUNTER — Other Ambulatory Visit: Payer: Self-pay

## 2020-12-27 DIAGNOSIS — Q828 Other specified congenital malformations of skin: Secondary | ICD-10-CM

## 2020-12-27 DIAGNOSIS — M778 Other enthesopathies, not elsewhere classified: Secondary | ICD-10-CM | POA: Diagnosis not present

## 2020-12-31 NOTE — Progress Notes (Signed)
Subjective:  Patient ID: Jeremy Nicholson, male    DOB: 1946-04-13,  MRN: [id number]  Chief Complaint  Patient presents with   Foot Pain    PT Stated that he is still having issues     75 y.o. male presents with the above complaint.  Patient presents for follow-up of right heel pain with heel porokeratotic lesion.  Patient states it does help him with injection and debridement.  He states he keeps coming back though.  He would like to know if he can continue doing this.  He denies any other acute complaints  Review of Systems: Negative except as noted in the HPI. Denies N/V/F/Ch.  Past Medical History:  Diagnosis Date   Aortic valve stenosis, moderate    per echo 06-02-2017 (in epic)  ef 55-60%,  possible bicuspid AV,  severeely thickened and calcified with severely restricted leaflet opening,  valve area 1.17cm^2, mean gradient 71mHg, peak gradient 573mg,  moderate AR   Arthritis    oa   BPH associated with nocturia    Complication of anesthesia    due to heart murmur  mod to severe aortic stenosis   Full dentures    Glaucoma, both eyes    Heart murmur, systolic    Hiatal hernia    Hypertension    Left ureteral stone    [place]   [facility] [place] [facility]   [place]   hospital admission--  unknown etiology   [place] [place]   Wears glasses     Current Outpatient Medications:    acetaminophen (TYLENOL) 500 MG tablet, Take 1,000 mg by mouth every 6 (six) hours as needed for moderate pain. , Disp: , Rfl:    amLODipine (NORVASC) 10 MG tablet, Take 10 mg by mouth every morning. , Disp: , Rfl:    aspirin 81 MG chewable tablet, Chew by mouth., Disp: , Rfl:    atorvastatin (LIPITOR) 40 MG tablet, Take 40 mg by mouth every evening. , Disp: , Rfl: 2   betamethasone, augmented, (DIPROLENE) 0.05 % lotion, betamethasone, augmented 0.05 % lotion, Disp: , Rfl:     brimonidine (ALPHAGAN) 0.2 % ophthalmic solution, 1 drop into affected eye, Disp: , Rfl:    Brinzolamide-Brimonidine 1-0.2 % SUSP, Place 1 drop into both eyes 2 (two) times daily. , Disp: , Rfl:    carvedilol (COREG) 25 MG tablet, Take 0.5 tablets by mouth 2 (two) times daily., Disp: , Rfl:    cephALEXin (KEFLEX) 500 MG capsule, Take 1 capsule (500 mg total) by mouth 2 (two) times daily. X 3 days to prevent post-op infection, Disp: 6 capsule, Rfl: 0   cetirizine (ZYRTEC) 10 MG tablet, Take by mouth., Disp: , Rfl:    cholecalciferol (VITAMIN D3) 25 MCG (1000 UNIT) tablet, 1 tablet, Disp: , Rfl:    Cholecalciferol 1000 units tablet, Take 1,000 Units by mouth every morning. , Disp: , Rfl:    diclofenac sodium (VOLTAREN) 1 % GEL, Apply 2 g topically daily as needed (knee pain)., Disp: , Rfl:    diclofenac Sodium (VOLTAREN) 1 % GEL, See admin instructions., Disp: , Rfl:    finasteride (PROSCAR) 5 MG tablet, Take 5 mg by mouth every morning. , Disp: , Rfl: 3   finasteride (PROSCAR) 5 MG tablet, Take 1 tablet by mouth daily., Disp: , Rfl:    furosemide (LASIX) 40 MG tablet, Take 40 mg by mouth  every morning., Disp: , Rfl:    insulin glargine (LANTUS) 100 UNIT/ML injection, Inject 8 Units into the skin at bedtime. , Disp: , Rfl:    Insulin Glargine-yfgn 100 UNIT/ML SOPN, Inject into the skin., Disp: , Rfl:    ipratropium (ATROVENT) 0.06 % nasal spray, Place into the nose., Disp: , Rfl:    latanoprost (XALATAN) 0.005 % ophthalmic solution, Place 1 drop into both eyes at bedtime. , Disp: , Rfl:    latanoprost (XALATAN) 0.005 % ophthalmic solution, Apply to eye., Disp: , Rfl:    lisinopril (ZESTRIL) 10 MG tablet, Take 1 tablet by mouth daily., Disp: , Rfl:    lisinopril (ZESTRIL) 5 MG tablet, Take by mouth., Disp: , Rfl:    lisinopril (ZESTRIL) 5 MG tablet, 2 tablets, Disp: , Rfl:    loratadine (CLARITIN) 10 MG tablet, 1 tablet, Disp: , Rfl:    metFORMIN (GLUCOPHAGE) 500 MG tablet, Take 1,000 mg by mouth 2  (two) times daily with a meal. , Disp: , Rfl:    metFORMIN (GLUCOPHAGE) 500 MG tablet, 1 tablet, Disp: , Rfl:    methocarbamol (ROBAXIN) 500 MG tablet, 1.5 tablets, Disp: , Rfl:    metoprolol tartrate (LOPRESSOR) 50 MG tablet, Take 50 mg by mouth 2 (two) times daily. , Disp: , Rfl:    niacin (SLO-NIACIN) 500 MG tablet, Take 500 mg by mouth every morning. , Disp: , Rfl:    omeprazole (PRILOSEC) 20 MG capsule, Take by mouth., Disp: , Rfl:    oxyCODONE (OXY IR/ROXICODONE) 5 MG immediate release tablet, , Disp: , Rfl:    potassium chloride SA (K-DUR,KLOR-CON) 20 MEQ tablet, Take 20 mEq by mouth every morning., Disp: , Rfl:    Semaglutide,0.25 or 0.'5MG'$ /DOS, 2 MG/1.5ML SOPN, Inject 0.5 mg into the skin once a week. , Disp: , Rfl:    sildenafil (VIAGRA) 100 MG tablet, TAKE ONE TABLET BY MOUTH APPROXIMATELY ONE HOUR BEFORE SEXUAL ACTIVITY., Disp: , Rfl:    tamsulosin (FLOMAX) 0.4 MG CAPS capsule, Take 0.4 mg by mouth at bedtime. , Disp: , Rfl:    traMADol (ULTRAM) 50 MG tablet, Take 1-2 tablets (50-100 mg total) by mouth every 6 (six) hours as needed for moderate pain or severe pain. Post-operatively, Disp: 20 tablet, Rfl: 0   triamcinolone (NASACORT) 55 MCG/ACT AERO nasal inhaler, 0, Disp: , Rfl:   Social History   Tobacco Use  Smoking Status Former   Years: 30.00   Types: Cigarettes   Quit date: 01/28/1995   Years since quitting: 25.9  Smokeless Tobacco Never    Allergies  Allergen Reactions   Atorvastatin Other (See Comments)   Chlorthalidone Other (See Comments)   Pravastatin Other (See Comments)   Latex Hives   Lisinopril Hives   Rosuvastatin Other (See Comments)   Tape Hives   Objective:  There were no vitals filed for this visit. There is no height or weight on file to calculate BMI. Constitutional Well developed. Well nourished.  Vascular Dorsalis pedis pulses palpable bilaterally. Posterior tibial pulses palpable bilaterally. Capillary refill normal to all digits.  No  cyanosis or clubbing noted. Pedal hair growth normal.  Neurologic Normal speech. Oriented to person, place, and time. Epicritic sensation to light touch grossly present bilaterally.  Dermatologic Nails well groomed and normal in appearance. No open wounds. No skin lesions.  Orthopedic: No further pain on palpation to the right ankle medial gutter.  No further pain with range of motion of the ankle joint.  No deep intra-articular pain  noted.  No pain at the Achilles tendon, peroneal tendon.    Pain on palpation to the right heel.  Porokeratotic lesion noted with central nucleated core.  Pain on palpation to the lesion.   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  Posterior and plantar heel spurring noted.  Midfoot arthritis noted.  No other bony abnormalities identified. Assessment:   No diagnosis found.   Plan:  Patient was evaluated and treated and all questions answered.  Bilateral ankle capsulitis/generalized ankle pain -Clinically doing a lot better.  Steroid injection helped as well as cam boot immobilization help with both capsulitis.  Right heel porokeratotic lesion with underlying capsulitis -I explained the patient the etiology of porokeratosis and various treatment options were discussed.  Given the amount of pain that is having I believe patient will benefit from a steroid injection of decrease acute inflammatory component associate with pain followed by debridement of the lesion.  Patient agrees with plan like to proceed with that. -A steroid injection was performed at right plantar heel using 1% plain Lidocaine and at point of maximal tenderness mg of Kenalog. This was well tolerated. -Heel pad was dispensed   No follow-ups on file.

## 2021-01-01 DIAGNOSIS — E785 Hyperlipidemia, unspecified: Secondary | ICD-10-CM | POA: Diagnosis not present

## 2021-01-01 DIAGNOSIS — N1831 Chronic kidney disease, stage 3a: Secondary | ICD-10-CM | POA: Diagnosis not present

## 2021-01-01 DIAGNOSIS — E1121 Type 2 diabetes mellitus with diabetic nephropathy: Secondary | ICD-10-CM | POA: Diagnosis not present

## 2021-01-01 DIAGNOSIS — I10 Essential (primary) hypertension: Secondary | ICD-10-CM | POA: Diagnosis not present

## 2021-01-01 DIAGNOSIS — E1159 Type 2 diabetes mellitus with other circulatory complications: Secondary | ICD-10-CM | POA: Diagnosis not present

## 2021-01-01 DIAGNOSIS — H409 Unspecified glaucoma: Secondary | ICD-10-CM | POA: Diagnosis not present

## 2021-01-01 DIAGNOSIS — E1122 Type 2 diabetes mellitus with diabetic chronic kidney disease: Secondary | ICD-10-CM | POA: Diagnosis not present

## 2021-01-01 DIAGNOSIS — N400 Enlarged prostate without lower urinary tract symptoms: Secondary | ICD-10-CM | POA: Diagnosis not present

## 2021-02-07 ENCOUNTER — Ambulatory Visit: Payer: Medicare HMO | Admitting: Podiatry

## 2021-02-07 ENCOUNTER — Other Ambulatory Visit: Payer: Self-pay

## 2021-02-07 DIAGNOSIS — R2689 Other abnormalities of gait and mobility: Secondary | ICD-10-CM

## 2021-02-07 DIAGNOSIS — M7751 Other enthesopathy of right foot: Secondary | ICD-10-CM

## 2021-02-07 DIAGNOSIS — N1831 Chronic kidney disease, stage 3a: Secondary | ICD-10-CM | POA: Diagnosis not present

## 2021-02-07 DIAGNOSIS — E1121 Type 2 diabetes mellitus with diabetic nephropathy: Secondary | ICD-10-CM | POA: Diagnosis not present

## 2021-02-07 DIAGNOSIS — I10 Essential (primary) hypertension: Secondary | ICD-10-CM | POA: Diagnosis not present

## 2021-02-07 DIAGNOSIS — E1122 Type 2 diabetes mellitus with diabetic chronic kidney disease: Secondary | ICD-10-CM | POA: Diagnosis not present

## 2021-02-07 DIAGNOSIS — E1159 Type 2 diabetes mellitus with other circulatory complications: Secondary | ICD-10-CM | POA: Diagnosis not present

## 2021-02-07 DIAGNOSIS — N400 Enlarged prostate without lower urinary tract symptoms: Secondary | ICD-10-CM | POA: Diagnosis not present

## 2021-02-07 DIAGNOSIS — E785 Hyperlipidemia, unspecified: Secondary | ICD-10-CM | POA: Diagnosis not present

## 2021-02-12 ENCOUNTER — Encounter: Payer: Self-pay | Admitting: Podiatry

## 2021-02-12 NOTE — Progress Notes (Signed)
Subjective:  Patient ID: Jeremy Nicholson, male    DOB: Oct 09, 1945,  MRN: [id number]  Chief Complaint  Patient presents with   Foot Pain    Right foot pain     75 y.o. male presents with the above complaint.  Patient presents with right ankle pain especially when walking.  There are some swelling associated with it.  Patient states that it is painful to walk has progressed to gotten worse and has been going on for a year.  He states it is painful to walk on it.  Is dull achy in nature pain scale 7 out of 10 he has not seen anyone else prior to seeing me he has not tried any other treatment options.   Review of Systems: Negative except as noted in the HPI. Denies N/V/F/Ch.  Past Medical History:  Diagnosis Date   Aortic valve stenosis, moderate    per echo 06-02-2017 (in epic)  ef 55-60%,  possible bicuspid AV,  severeely thickened and calcified with severely restricted leaflet opening,  valve area 1.17cm^2, mean gradient 83mHg, peak gradient 582mg,  moderate AR   Arthritis    oa   BPH associated with nocturia    Complication of anesthesia    due to heart murmur  mod to severe aortic stenosis   Full dentures    Glaucoma, both eyes    Heart murmur, systolic    Hiatal hernia    Hypertension    Left ureteral stone    [facility] [place] [facility]   [facility] [place] [facility]   [place]ic [place] syndrome[place]   hospital admission--  unknown etiology   Type 2 diabetes mellitus ([facility]   Wears glasses     Current Outpatient Medications:    acetaminophen (TYLENOL) 500 MG tablet, Take 1,000 mg by mouth every 6 (six) hours as needed for moderate pain. , Disp: , Rfl:    amLODipine (NORVASC) 10 MG tablet, Take 10 mg by mouth every morning. , Disp: , Rfl:    aspirin 81 MG chewable tablet, Chew by mouth., Disp: , Rfl:    atorvastatin (LIPITOR) 40 MG tablet, Take 40 mg by mouth every evening. , Disp: , Rfl: 2   betamethasone,  augmented, (DIPROLENE) 0.05 % lotion, betamethasone, augmented 0.05 % lotion, Disp: , Rfl:    brimonidine (ALPHAGAN) 0.2 % ophthalmic solution, 1 drop into affected eye, Disp: , Rfl:    Brinzolamide-Brimonidine 1-0.2 % SUSP, Place 1 drop into both eyes 2 (two) times daily. , Disp: , Rfl:    carvedilol (COREG) 25 MG tablet, Take 0.5 tablets by mouth 2 (two) times daily., Disp: , Rfl:    cephALEXin (KEFLEX) 500 MG capsule, Take 1 capsule (500 mg total) by mouth 2 (two) times daily. X 3 days to prevent post-op infection, Disp: 6 capsule, Rfl: 0   cetirizine (ZYRTEC) 10 MG tablet, Take by mouth., Disp: , Rfl:    cholecalciferol (VITAMIN D3) 25 MCG (1000 UNIT) tablet, 1 tablet, Disp: , Rfl:    Cholecalciferol 1000 units tablet, Take 1,000 Units by mouth every morning. , Disp: , Rfl:    diclofenac sodium (VOLTAREN) 1 % GEL, Apply 2 g topically daily as needed (knee pain)., Disp: , Rfl:    diclofenac Sodium (VOLTAREN) 1 % GEL, See admin instructions., Disp: , Rfl:    finasteride (PROSCAR) 5 MG tablet, Take 5 mg by mouth every morning. , Disp: , Rfl: 3   finasteride (PROSCAR)  5 MG tablet, Take 1 tablet by mouth daily., Disp: , Rfl:    furosemide (LASIX) 40 MG tablet, Take 40 mg by mouth every morning., Disp: , Rfl:    insulin glargine (LANTUS) 100 UNIT/ML injection, Inject 8 Units into the skin at bedtime. , Disp: , Rfl:    Insulin Glargine-yfgn 100 UNIT/ML SOPN, Inject into the skin., Disp: , Rfl:    ipratropium (ATROVENT) 0.06 % nasal spray, Place into the nose., Disp: , Rfl:    latanoprost (XALATAN) 0.005 % ophthalmic solution, Place 1 drop into both eyes at bedtime. , Disp: , Rfl:    latanoprost (XALATAN) 0.005 % ophthalmic solution, Apply to eye., Disp: , Rfl:    lisinopril (ZESTRIL) 10 MG tablet, Take 1 tablet by mouth daily., Disp: , Rfl:    lisinopril (ZESTRIL) 5 MG tablet, Take by mouth., Disp: , Rfl:    lisinopril (ZESTRIL) 5 MG tablet, 2 tablets, Disp: , Rfl:    loratadine (CLARITIN) 10 MG  tablet, 1 tablet, Disp: , Rfl:    metFORMIN (GLUCOPHAGE) 500 MG tablet, Take 1,000 mg by mouth 2 (two) times daily with a meal. , Disp: , Rfl:    metFORMIN (GLUCOPHAGE) 500 MG tablet, 1 tablet, Disp: , Rfl:    methocarbamol (ROBAXIN) 500 MG tablet, 1.5 tablets, Disp: , Rfl:    metoprolol tartrate (LOPRESSOR) 50 MG tablet, Take 50 mg by mouth 2 (two) times daily. , Disp: , Rfl:    niacin (SLO-NIACIN) 500 MG tablet, Take 500 mg by mouth every morning. , Disp: , Rfl:    omeprazole (PRILOSEC) 20 MG capsule, Take by mouth., Disp: , Rfl:    oxyCODONE (OXY IR/ROXICODONE) 5 MG immediate release tablet, , Disp: , Rfl:    potassium chloride SA (K-DUR,KLOR-CON) 20 MEQ tablet, Take 20 mEq by mouth every morning., Disp: , Rfl:    Semaglutide,0.25 or 0.'5MG'$ /DOS, 2 MG/1.5ML SOPN, Inject 0.5 mg into the skin once a week. , Disp: , Rfl:    sildenafil (VIAGRA) 100 MG tablet, TAKE ONE TABLET BY MOUTH APPROXIMATELY ONE HOUR BEFORE SEXUAL ACTIVITY., Disp: , Rfl:    tamsulosin (FLOMAX) 0.4 MG CAPS capsule, Take 0.4 mg by mouth at bedtime. , Disp: , Rfl:    traMADol (ULTRAM) 50 MG tablet, Take 1-2 tablets (50-100 mg total) by mouth every 6 (six) hours as needed for moderate pain or severe pain. Post-operatively, Disp: 20 tablet, Rfl: 0   triamcinolone (NASACORT) 55 MCG/ACT AERO nasal inhaler, 0, Disp: , Rfl:   Social History   Tobacco Use  Smoking Status Former   Years: 30.00   Types: Cigarettes   Quit date: 01/28/1995   Years since quitting: 26.0  Smokeless Tobacco Never    Allergies  Allergen Reactions   Atorvastatin Other (See Comments)   Chlorthalidone Other (See Comments)   Pravastatin Other (See Comments)   Latex Hives   Lisinopril Hives   Rosuvastatin Other (See Comments)   Tape Hives   Objective:  There were no vitals filed for this visit. There is no height or weight on file to calculate BMI. Constitutional Well developed. Well nourished.  Vascular Dorsalis pedis pulses palpable  bilaterally. Posterior tibial pulses palpable bilaterally. Capillary refill normal to all digits.  No cyanosis or clubbing noted. Pedal hair growth normal.  Neurologic Normal speech. Oriented to person, place, and time. Epicritic sensation to light touch grossly present bilaterally.  Dermatologic Nails well groomed and normal in appearance. No open wounds. No skin lesions.  Orthopedic:  Pain on  palpation to the right ankle medial gutter.  Pain with range of motion of the ankle joint.  Deep intra-articular pain noted.  No pain at the Achilles tendon, peroneal tendon.  Generalized circumferential ankle pain noted at the ATFL ligament as well as medial gutter as well.   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  Posterior and plantar heel spurring noted.  Midfoot arthritis noted.  No other bony abnormalities identified. Assessment:   1. Capsulitis of ankle, right   2. Antalgic gait     Plan:  Patient was evaluated and treated and all questions answered.  Rightankle capsulitis/generalized ankle pain with underlying antalgic gait -I explained the patient the etiology of ankle pain and various treatment options were discussed.  Given that patient has mild to moderate osteoarthritic changes to the ankle joint is likely leading to inflammation of the joint and the pain around it.  I discussed with the patient -Given the amount of pain that is having I believe patient will benefit from a steroid injection.  Patient agrees to the plan like to proceed with a steroid injection in the ankle -A steroid injection was performed at right ankle joint using 1% plain Lidocaine and 10 mg of Kenalog. This was well tolerated. -He will also benefit from a Tri-Lock ankle brace to give stability to the ankle joint he is not able to tolerate the cam boot.  Ankle brace was dispensed   No follow-ups on file.

## 2021-03-21 ENCOUNTER — Ambulatory Visit: Payer: Medicare HMO | Admitting: Podiatry

## 2021-03-26 ENCOUNTER — Other Ambulatory Visit: Payer: Self-pay

## 2021-03-26 ENCOUNTER — Ambulatory Visit (INDEPENDENT_AMBULATORY_CARE_PROVIDER_SITE_OTHER): Payer: Medicare HMO | Admitting: Podiatry

## 2021-03-26 DIAGNOSIS — D2371 Other benign neoplasm of skin of right lower limb, including hip: Secondary | ICD-10-CM

## 2021-03-26 DIAGNOSIS — Q828 Other specified congenital malformations of skin: Secondary | ICD-10-CM

## 2021-03-26 DIAGNOSIS — L989 Disorder of the skin and subcutaneous tissue, unspecified: Secondary | ICD-10-CM

## 2021-03-28 ENCOUNTER — Encounter: Payer: Self-pay | Admitting: Podiatry

## 2021-03-28 NOTE — Progress Notes (Signed)
Subjective:  Patient ID: Jeremy Nicholson, male    DOB: 29-Aug-1945,  MRN: [id number]  Chief Complaint  Patient presents with   Foot Pain    PT stated that he has some good days and bad ones     75 y.o. male presents with the above complaint.  Patient presents for follow-up of right heel pain with heel porokeratotic lesion.  Patient states the injection only helped for a little bit.  He would like to have removed.  He denies any other acute complaints.  Review of Systems: Negative except as noted in the HPI. Denies N/V/F/Ch.  Past Medical History:  Diagnosis Date   Aortic valve stenosis, moderate    per echo 06-02-2017 (in epic)  ef 55-60%,  possible bicuspid AV,  severeely thickened and calcified with severely restricted leaflet opening,  valve area 1.17cm^2, mean gradient 11mmHg, peak gradient 17mmHg,  moderate AR   Arthritis    oa   BPH associated with nocturia    Complication of anesthesia    due to heart murmur  mod to severe aortic stenosis   Full dentures    Glaucoma, both eyes    Heart murmur, systolic    Hiatal hernia    Hypertension    Left ureteral stone    Myocardial infarction ([place])    mild in 11-2017 at [facility] F/U from [place] on chart   SIRS (systemic inflammatory response syndrome) ([place]) 05/31/2017   hospital admission--  unknown etiology   Type 2 diabetes mellitus ([place])    Wears glasses     Current Outpatient Medications:    acetaminophen (TYLENOL) 500 MG tablet, Take 1,000 mg by mouth every 6 (six) hours as needed for moderate pain. , Disp: , Rfl:    amLODipine (NORVASC) 10 MG tablet, Take 10 mg by mouth every morning. , Disp: , Rfl:    aspirin 81 MG chewable tablet, Chew by mouth., Disp: , Rfl:    atorvastatin (LIPITOR) 40 MG tablet, Take 40 mg by mouth every evening. , Disp: , Rfl: 2   betamethasone, augmented, (DIPROLENE) 0.05 % lotion, betamethasone, augmented 0.05 % lotion, Disp: , Rfl:    brimonidine (ALPHAGAN) 0.2 % ophthalmic solution, 1 drop into  affected eye, Disp: , Rfl:    Brinzolamide-Brimonidine 1-0.2 % SUSP, Place 1 drop into both eyes 2 (two) times daily. , Disp: , Rfl:    carvedilol (COREG) 25 MG tablet, Take 0.5 tablets by mouth 2 (two) times daily., Disp: , Rfl:    cephALEXin (KEFLEX) 500 MG capsule, Take 1 capsule (500 mg total) by mouth 2 (two) times daily. X 3 days to prevent post-op infection, Disp: 6 capsule, Rfl: 0   cetirizine (ZYRTEC) 10 MG tablet, Take by mouth., Disp: , Rfl:    cholecalciferol (VITAMIN D3) 25 MCG (1000 UNIT) tablet, 1 tablet, Disp: , Rfl:    Cholecalciferol 1000 units tablet, Take 1,000 Units by mouth every morning. , Disp: , Rfl:    diclofenac sodium (VOLTAREN) 1 % GEL, Apply 2 g topically daily as needed (knee pain)., Disp: , Rfl:    diclofenac Sodium (VOLTAREN) 1 % GEL, See admin instructions., Disp: , Rfl:    finasteride (PROSCAR) 5 MG tablet, Take 5 mg by mouth every morning. , Disp: , Rfl: 3   finasteride (PROSCAR) 5 MG tablet, Take 1 tablet by mouth daily., Disp: , Rfl:    furosemide (LASIX) 40 MG tablet, Take 40 mg by mouth every morning., Disp: , Rfl:    insulin glargine (  LANTUS) 100 UNIT/ML injection, Inject 8 Units into the skin at bedtime. , Disp: , Rfl:    Insulin Glargine-yfgn 100 UNIT/ML SOPN, Inject into the skin., Disp: , Rfl:    ipratropium (ATROVENT) 0.06 % nasal spray, Place into the nose., Disp: , Rfl:    latanoprost (XALATAN) 0.005 % ophthalmic solution, Place 1 drop into both eyes at bedtime. , Disp: , Rfl:    latanoprost (XALATAN) 0.005 % ophthalmic solution, Apply to eye., Disp: , Rfl:    lisinopril (ZESTRIL) 10 MG tablet, Take 1 tablet by mouth daily., Disp: , Rfl:    lisinopril (ZESTRIL) 5 MG tablet, Take by mouth., Disp: , Rfl:    lisinopril (ZESTRIL) 5 MG tablet, 2 tablets, Disp: , Rfl:    loratadine (CLARITIN) 10 MG tablet, 1 tablet, Disp: , Rfl:    metFORMIN (GLUCOPHAGE) 500 MG tablet, Take 1,000 mg by mouth 2 (two) times daily with a meal. , Disp: , Rfl:    metFORMIN  (GLUCOPHAGE) 500 MG tablet, 1 tablet, Disp: , Rfl:    methocarbamol (ROBAXIN) 500 MG tablet, 1.5 tablets, Disp: , Rfl:    metoprolol tartrate (LOPRESSOR) 50 MG tablet, Take 50 mg by mouth 2 (two) times daily. , Disp: , Rfl:    niacin (SLO-NIACIN) 500 MG tablet, Take 500 mg by mouth every morning. , Disp: , Rfl:    omeprazole (PRILOSEC) 20 MG capsule, Take by mouth., Disp: , Rfl:    oxyCODONE (OXY IR/ROXICODONE) 5 MG immediate release tablet, , Disp: , Rfl:    potassium chloride SA (K-DUR,KLOR-CON) 20 MEQ tablet, Take 20 mEq by mouth every morning., Disp: , Rfl:    Semaglutide,0.25 or 0.5MG /DOS, 2 MG/1.5ML SOPN, Inject 0.5 mg into the skin once a week. , Disp: , Rfl:    sildenafil (VIAGRA) 100 MG tablet, TAKE ONE TABLET BY MOUTH APPROXIMATELY ONE HOUR BEFORE SEXUAL ACTIVITY., Disp: , Rfl:    tamsulosin (FLOMAX) 0.4 MG CAPS capsule, Take 0.4 mg by mouth at bedtime. , Disp: , Rfl:    traMADol (ULTRAM) 50 MG tablet, Take 1-2 tablets (50-100 mg total) by mouth every 6 (six) hours as needed for moderate pain or severe pain. Post-operatively, Disp: 20 tablet, Rfl: 0   triamcinolone (NASACORT) 55 MCG/ACT AERO nasal inhaler, 0, Disp: , Rfl:   Social History   Tobacco Use  Smoking Status Former   Years: 30.00   Types: Cigarettes   Quit date: 01/28/1995   Years since quitting: 26.1  Smokeless Tobacco Never    Allergies  Allergen Reactions   Atorvastatin Other (See Comments)   Chlorthalidone Other (See Comments)   Pravastatin Other (See Comments)   Latex Hives   Lisinopril Hives   Rosuvastatin Other (See Comments)   Tape Hives   Objective:  There were no vitals filed for this visit. There is no height or weight on file to calculate BMI. Constitutional Well developed. Well nourished.  Vascular Dorsalis pedis pulses palpable bilaterally. Posterior tibial pulses palpable bilaterally. Capillary refill normal to all digits.  No cyanosis or clubbing noted. Pedal hair growth normal.   Neurologic Normal speech. Oriented to person, place, and time. Epicritic sensation to light touch grossly present bilaterally.  Dermatologic Nails well groomed and normal in appearance. No open wounds. No skin lesions.  Orthopedic: No further pain on palpation to the right ankle medial gutter.  No further pain with range of motion of the ankle joint.  No deep intra-articular pain noted.  No pain at the Achilles tendon, peroneal tendon.  Pain on palpation to the right heel.  Porokeratotic lesion noted with central nucleated core.  Pain on palpation to the lesion.   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  Posterior and plantar heel spurring noted.  Midfoot arthritis noted.  No other bony abnormalities identified. Assessment:   1. Benign skin lesion   2. Porokeratosis      Plan:  Patient was evaluated and treated and all questions answered.  Bilateral ankle capsulitis/generalized ankle pain -Clinically doing a lot better.  Steroid injection helped as well as cam boot immobilization help with both capsulitis.  Right heel porokeratotic lesion with underlying capsulitis -I explained the patient the etiology of porokeratosis and various treatment options were discussed.   -Given the amount of pain that is having I believe he will benefit from surgical excision of the lesion in the office.  I will plan on doing a punch excision of the lesion.  Patient agrees with the plan and would like to proceed with that.  -Excision of the skin lesion Skin was prepped in standard technique with Betadine.  One-to-one mixture of 1% lidocaine plain half percent Marcaine plain was injected circumferentially in V-block fashion 3 cc.  3 mm punch biopsy was utilized to remove the lesion in its entirety down to the level of the subcutaneous tissue.  The wound site was dressed with triple antibiotic 4 x 4 gauze Kerlix and Ace  bandage.  I have asked the patient to keep it covered with Neosporin and a Band-Aid until healing.  I will see her back again in 6 weeks    No follow-ups on file.

## 2021-04-15 DIAGNOSIS — C61 Malignant neoplasm of prostate: Secondary | ICD-10-CM | POA: Diagnosis not present

## 2021-04-22 DIAGNOSIS — C61 Malignant neoplasm of prostate: Secondary | ICD-10-CM | POA: Diagnosis not present

## 2021-04-22 DIAGNOSIS — N5201 Erectile dysfunction due to arterial insufficiency: Secondary | ICD-10-CM | POA: Diagnosis not present

## 2021-04-22 DIAGNOSIS — R338 Other retention of urine: Secondary | ICD-10-CM | POA: Diagnosis not present

## 2021-04-28 DIAGNOSIS — H409 Unspecified glaucoma: Secondary | ICD-10-CM | POA: Diagnosis not present

## 2021-04-28 DIAGNOSIS — E785 Hyperlipidemia, unspecified: Secondary | ICD-10-CM | POA: Diagnosis not present

## 2021-04-28 DIAGNOSIS — N1831 Chronic kidney disease, stage 3a: Secondary | ICD-10-CM | POA: Diagnosis not present

## 2021-04-28 DIAGNOSIS — I700 Atherosclerosis of aorta: Secondary | ICD-10-CM | POA: Diagnosis not present

## 2021-04-28 DIAGNOSIS — C61 Malignant neoplasm of prostate: Secondary | ICD-10-CM | POA: Diagnosis not present

## 2021-04-28 DIAGNOSIS — I10 Essential (primary) hypertension: Secondary | ICD-10-CM | POA: Diagnosis not present

## 2021-04-28 DIAGNOSIS — E1122 Type 2 diabetes mellitus with diabetic chronic kidney disease: Secondary | ICD-10-CM | POA: Diagnosis not present

## 2021-04-28 DIAGNOSIS — I739 Peripheral vascular disease, unspecified: Secondary | ICD-10-CM | POA: Diagnosis not present

## 2021-04-28 DIAGNOSIS — Z0000 Encounter for general adult medical examination without abnormal findings: Secondary | ICD-10-CM | POA: Diagnosis not present

## 2021-04-30 ENCOUNTER — Other Ambulatory Visit: Payer: Self-pay | Admitting: Family Medicine

## 2021-04-30 DIAGNOSIS — Z136 Encounter for screening for cardiovascular disorders: Secondary | ICD-10-CM

## 2021-04-30 DIAGNOSIS — Z87891 Personal history of nicotine dependence: Secondary | ICD-10-CM

## 2021-05-02 DIAGNOSIS — U071 COVID-19: Secondary | ICD-10-CM | POA: Diagnosis not present

## 2021-05-07 ENCOUNTER — Ambulatory Visit: Payer: Medicare HMO | Admitting: Podiatry

## 2021-05-12 ENCOUNTER — Ambulatory Visit: Payer: Medicare HMO

## 2021-05-23 ENCOUNTER — Ambulatory Visit: Payer: Medicare HMO | Admitting: Podiatry

## 2021-05-27 ENCOUNTER — Ambulatory Visit
Admission: RE | Admit: 2021-05-27 | Discharge: 2021-05-27 | Disposition: A | Discharge status: Home / Self Care | Payer: Medicare HMO | Source: Ambulatory Visit | Attending: Family Medicine | Admitting: Family Medicine

## 2021-05-27 DIAGNOSIS — Z87891 Personal history of nicotine dependence: Secondary | ICD-10-CM

## 2021-05-27 DIAGNOSIS — Z136 Encounter for screening for cardiovascular disorders: Secondary | ICD-10-CM | POA: Diagnosis not present

## 2021-05-28 ENCOUNTER — Ambulatory Visit: Payer: Medicare HMO | Admitting: Podiatry

## 2021-05-28 ENCOUNTER — Other Ambulatory Visit: Payer: Self-pay

## 2021-05-28 DIAGNOSIS — Q828 Other specified congenital malformations of skin: Secondary | ICD-10-CM | POA: Diagnosis not present

## 2021-05-28 DIAGNOSIS — L989 Disorder of the skin and subcutaneous tissue, unspecified: Secondary | ICD-10-CM | POA: Diagnosis not present

## 2021-05-30 NOTE — Progress Notes (Signed)
Subjective:  Patient ID: Jeremy Nicholson, male    DOB: 1946/05/19,  MRN: [id number]  Chief Complaint  Patient presents with   Callouses    Right heel  callus     75 y.o. male presents with the above complaint.  Patient presents for follow-up of right heel pain with heel porokeratotic lesion status post punch excision.  He states that he is doing well.  He has had some mild pain.  He just wants to make sure is not coming back.  Review of Systems: Negative except as noted in the HPI. Denies N/V/F/Ch.  Past Medical History:  Diagnosis Date   Aortic valve stenosis, moderate    per echo 06-02-2017 (in epic)  ef 55-60%,  possible bicuspid AV,  severeely thickened and calcified with severely restricted leaflet opening,  valve area 1.17cm^2, mean gradient 57mmHg, peak gradient 35mmHg,  moderate AR   Arthritis    oa   BPH associated with nocturia    Complication of anesthesia    due to heart murmur  mod to severe aortic stenosis   Full dentures    Glaucoma, both eyes    Heart murmur, systolic    Hiatal hernia    Hypertension    Left ureteral stone    Myocardial infarction ([place])    mild in 11-2017 at [facility] F/U from [place] on chart   SIRS (systemic inflammatory response syndrome) ([place]) 05/31/2017   hospital admission--  unknown etiology   Type 2 diabetes mellitus ([place])    Wears glasses     Current Outpatient Medications:    acetaminophen (TYLENOL) 500 MG tablet, Take 1,000 mg by mouth every 6 (six) hours as needed for moderate pain. , Disp: , Rfl:    amLODipine (NORVASC) 10 MG tablet, Take 10 mg by mouth every morning. , Disp: , Rfl:    aspirin 81 MG chewable tablet, Chew by mouth., Disp: , Rfl:    atorvastatin (LIPITOR) 40 MG tablet, Take 40 mg by mouth every evening. , Disp: , Rfl: 2   betamethasone, augmented, (DIPROLENE) 0.05 % lotion, betamethasone, augmented 0.05 % lotion, Disp: , Rfl:    brimonidine (ALPHAGAN) 0.2 % ophthalmic solution, 1 drop into affected eye, Disp: , Rfl:     Brinzolamide-Brimonidine 1-0.2 % SUSP, Place 1 drop into both eyes 2 (two) times daily. , Disp: , Rfl:    carvedilol (COREG) 25 MG tablet, Take 0.5 tablets by mouth 2 (two) times daily., Disp: , Rfl:    cephALEXin (KEFLEX) 500 MG capsule, Take 1 capsule (500 mg total) by mouth 2 (two) times daily. X 3 days to prevent post-op infection, Disp: 6 capsule, Rfl: 0   cetirizine (ZYRTEC) 10 MG tablet, Take by mouth., Disp: , Rfl:    cholecalciferol (VITAMIN D3) 25 MCG (1000 UNIT) tablet, 1 tablet, Disp: , Rfl:    Cholecalciferol 1000 units tablet, Take 1,000 Units by mouth every morning. , Disp: , Rfl:    diclofenac sodium (VOLTAREN) 1 % GEL, Apply 2 g topically daily as needed (knee pain)., Disp: , Rfl:    diclofenac Sodium (VOLTAREN) 1 % GEL, See admin instructions., Disp: , Rfl:    finasteride (PROSCAR) 5 MG tablet, Take 5 mg by mouth every morning. , Disp: , Rfl: 3   finasteride (PROSCAR) 5 MG tablet, Take 1 tablet by mouth daily., Disp: , Rfl:    furosemide (LASIX) 40 MG tablet, Take 40 mg by mouth every morning., Disp: , Rfl:    insulin glargine (LANTUS) 100 UNIT/ML  injection, Inject 8 Units into the skin at bedtime. , Disp: , Rfl:    Insulin Glargine-yfgn 100 UNIT/ML SOPN, Inject into the skin., Disp: , Rfl:    ipratropium (ATROVENT) 0.06 % nasal spray, Place into the nose., Disp: , Rfl:    latanoprost (XALATAN) 0.005 % ophthalmic solution, Place 1 drop into both eyes at bedtime. , Disp: , Rfl:    latanoprost (XALATAN) 0.005 % ophthalmic solution, Apply to eye., Disp: , Rfl:    lisinopril (ZESTRIL) 10 MG tablet, Take 1 tablet by mouth daily., Disp: , Rfl:    lisinopril (ZESTRIL) 5 MG tablet, Take by mouth., Disp: , Rfl:    lisinopril (ZESTRIL) 5 MG tablet, 2 tablets, Disp: , Rfl:    loratadine (CLARITIN) 10 MG tablet, 1 tablet, Disp: , Rfl:    metFORMIN (GLUCOPHAGE) 500 MG tablet, Take 1,000 mg by mouth 2 (two) times daily with a meal. , Disp: , Rfl:    metFORMIN (GLUCOPHAGE) 500 MG tablet,  1 tablet, Disp: , Rfl:    methocarbamol (ROBAXIN) 500 MG tablet, 1.5 tablets, Disp: , Rfl:    metoprolol tartrate (LOPRESSOR) 50 MG tablet, Take 50 mg by mouth 2 (two) times daily. , Disp: , Rfl:    niacin (SLO-NIACIN) 500 MG tablet, Take 500 mg by mouth every morning. , Disp: , Rfl:    omeprazole (PRILOSEC) 20 MG capsule, Take by mouth., Disp: , Rfl:    oxyCODONE (OXY IR/ROXICODONE) 5 MG immediate release tablet, , Disp: , Rfl:    potassium chloride SA (K-DUR,KLOR-CON) 20 MEQ tablet, Take 20 mEq by mouth every morning., Disp: , Rfl:    Semaglutide,0.25 or 0.5MG /DOS, 2 MG/1.5ML SOPN, Inject 0.5 mg into the skin once a week. , Disp: , Rfl:    sildenafil (VIAGRA) 100 MG tablet, TAKE ONE TABLET BY MOUTH APPROXIMATELY ONE HOUR BEFORE SEXUAL ACTIVITY., Disp: , Rfl:    tamsulosin (FLOMAX) 0.4 MG CAPS capsule, Take 0.4 mg by mouth at bedtime. , Disp: , Rfl:    traMADol (ULTRAM) 50 MG tablet, Take 1-2 tablets (50-100 mg total) by mouth every 6 (six) hours as needed for moderate pain or severe pain. Post-operatively, Disp: 20 tablet, Rfl: 0   triamcinolone (NASACORT) 55 MCG/ACT AERO nasal inhaler, 0, Disp: , Rfl:   Social History   Tobacco Use  Smoking Status Former   Years: 30.00   Types: Cigarettes   Quit date: 01/28/1995   Years since quitting: 26.3  Smokeless Tobacco Never    Allergies  Allergen Reactions   Atorvastatin Other (See Comments)   Chlorthalidone Other (See Comments)   Pravastatin Other (See Comments)   Latex Hives   Lisinopril Hives   Rosuvastatin Other (See Comments)   Tape Hives   Objective:  There were no vitals filed for this visit. There is no height or weight on file to calculate BMI. Constitutional Well developed. Well nourished.  Vascular Dorsalis pedis pulses palpable bilaterally. Posterior tibial pulses palpable bilaterally. Capillary refill normal to all digits.  No cyanosis or clubbing noted. Pedal hair growth normal.  Neurologic Normal speech. Oriented  to person, place, and time. Epicritic sensation to light touch grossly present bilaterally.  Dermatologic Nails well groomed and normal in appearance. No open wounds. No skin lesions.  Orthopedic: No further pain on palpation to the right ankle medial gutter.  No further pain with range of motion of the ankle joint.  No deep intra-articular pain noted.  No pain at the Achilles tendon, peroneal tendon.  Mild pain on palpation to the right heel.  No further porokeratotic porokeratotic lesion noted with central nucleated core.  Pain on palpation to the lesion.   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  Posterior and plantar heel spurring noted.  Midfoot arthritis noted.  No other bony abnormalities identified. Assessment:   No diagnosis found.    Plan:  Patient was evaluated and treated and all questions answered.  Bilateral ankle capsulitis/generalized ankle pain -Clinically doing a lot better.  Steroid injection helped as well as cam boot immobilization help with both capsulitis.  Right heel porokeratotic lesion with underlying capsulitis with a history of punch excision -I explained the patient the etiology of porokeratosis and various treatment options were discussed.   -Clinically intact lesion is healing well from punch excision.  I debrided down the hyperkeratotic lesion.  I do not see signs of central nucleated core.  Some of the pain that he is having might likely be just from the excision of the porokeratosis/benign skin lesion    No follow-ups on file.

## 2021-07-08 DIAGNOSIS — E1122 Type 2 diabetes mellitus with diabetic chronic kidney disease: Secondary | ICD-10-CM | POA: Diagnosis not present

## 2021-07-08 DIAGNOSIS — N1831 Chronic kidney disease, stage 3a: Secondary | ICD-10-CM | POA: Diagnosis not present

## 2021-07-08 DIAGNOSIS — E785 Hyperlipidemia, unspecified: Secondary | ICD-10-CM | POA: Diagnosis not present

## 2021-07-08 DIAGNOSIS — I10 Essential (primary) hypertension: Secondary | ICD-10-CM | POA: Diagnosis not present

## 2021-07-23 DIAGNOSIS — G4710 Hypersomnia, unspecified: Secondary | ICD-10-CM | POA: Diagnosis not present

## 2021-07-23 DIAGNOSIS — Z7984 Long term (current) use of oral hypoglycemic drugs: Secondary | ICD-10-CM | POA: Diagnosis not present

## 2021-07-23 DIAGNOSIS — E1122 Type 2 diabetes mellitus with diabetic chronic kidney disease: Secondary | ICD-10-CM | POA: Diagnosis not present

## 2021-07-23 DIAGNOSIS — I10 Essential (primary) hypertension: Secondary | ICD-10-CM | POA: Diagnosis not present

## 2021-07-23 DIAGNOSIS — M7989 Other specified soft tissue disorders: Secondary | ICD-10-CM | POA: Diagnosis not present

## 2021-07-23 DIAGNOSIS — N1831 Chronic kidney disease, stage 3a: Secondary | ICD-10-CM | POA: Diagnosis not present

## 2021-07-30 ENCOUNTER — Ambulatory Visit: Payer: Medicare HMO | Admitting: Podiatry

## 2021-07-30 ENCOUNTER — Other Ambulatory Visit: Payer: Self-pay

## 2021-07-30 DIAGNOSIS — I999 Unspecified disorder of circulatory system: Secondary | ICD-10-CM | POA: Diagnosis not present

## 2021-07-30 DIAGNOSIS — M7751 Other enthesopathy of right foot: Secondary | ICD-10-CM

## 2021-08-05 ENCOUNTER — Encounter: Payer: Self-pay | Admitting: Podiatry

## 2021-08-05 NOTE — Progress Notes (Signed)
Subjective:  Patient ID: Jeremy Nicholson, male    DOB: 07-09-1945,  MRN: [id number]  Chief Complaint  Patient presents with   Arthritis    Right ankle giving him some pain     76 y.o. male presents with the above complaint.Patient presents presents with complaint of right ankle pain/follow-up.  Patient states it started to hurt again.  He would like to get another injection.  He has secondary complaint of both foot and ankle pain that seems to came out of nowhere is progressing and worse.  Hurts with ambulation hurts with taking few steps.  He is experiencing claudication type of pain.   Review of Systems: Negative except as noted in the HPI. Denies N/V/F/Ch.  Past Medical History:  Diagnosis Date   Aortic valve stenosis, moderate    per echo 06-02-2017 (in epic)  ef 55-60%,  possible bicuspid AV,  severeely thickened and calcified with severely restricted leaflet opening,  valve area 1.17cm^2, mean gradient 3mHg, peak gradient 59mg,  moderate AR   Arthritis    oa   BPH associated with nocturia    Complication of anesthesia    due to heart murmur  mod to severe aortic stenosis   Full dentures    Glaucoma, both eyes    Heart murmur, systolic    Hiatal hernia    Hypertension    Left ureteral stone    [place]   [facility] [place] [facility]   [place]   [place] admission--  unknown etiology   [place]   Wears glasses     Current Outpatient Medications:    acetaminophen (TYLENOL) 500 MG tablet, Take 1,000 mg by mouth every 6 (six) hours as needed for moderate pain. , Disp: , Rfl:    amLODipine (NORVASC) 10 MG tablet, Take 10 mg by mouth every morning. , Disp: , Rfl:    aspirin 81 MG chewable tablet, Chew by mouth., Disp: , Rfl:    atorvastatin (LIPITOR) 40 MG tablet, Take 40 mg by mouth every evening. , Disp: , Rfl: 2   betamethasone, augmented,  (DIPROLENE) 0.05 % lotion, betamethasone, augmented 0.05 % lotion, Disp: , Rfl:    brimonidine (ALPHAGAN) 0.2 % ophthalmic solution, 1 drop into affected eye, Disp: , Rfl:    Brinzolamide-Brimonidine 1-0.2 % SUSP, Place 1 drop into both eyes 2 (two) times daily. , Disp: , Rfl:    carvedilol (COREG) 25 MG tablet, Take 0.5 tablets by mouth 2 (two) times daily., Disp: , Rfl:    cephALEXin (KEFLEX) 500 MG capsule, Take 1 capsule (500 mg total) by mouth 2 (two) times daily. X 3 days to prevent post-op infection, Disp: 6 capsule, Rfl: 0   cetirizine (ZYRTEC) 10 MG tablet, Take by mouth., Disp: , Rfl:    cholecalciferol (VITAMIN D3) 25 MCG (1000 UNIT) tablet, 1 tablet, Disp: , Rfl:    Cholecalciferol 1000 units tablet, Take 1,000 Units by mouth every morning. , Disp: , Rfl:    diclofenac sodium (VOLTAREN) 1 % GEL, Apply 2 g topically daily as needed (knee pain)., Disp: , Rfl:    diclofenac Sodium (VOLTAREN) 1 % GEL, See admin instructions., Disp: , Rfl:    finasteride (PROSCAR) 5 MG tablet, Take 5 mg by mouth every morning. , Disp: , Rfl: 3   finasteride (PROSCAR) 5 MG tablet, Take 1 tablet by mouth daily., Disp: , Rfl:  furosemide (LASIX) 40 MG tablet, Take 40 mg by mouth every morning., Disp: , Rfl:    insulin glargine (LANTUS) 100 UNIT/ML injection, Inject 8 Units into the skin at bedtime. , Disp: , Rfl:    Insulin Glargine-yfgn 100 UNIT/ML SOPN, Inject into the skin., Disp: , Rfl:    ipratropium (ATROVENT) 0.06 % nasal spray, Place into the nose., Disp: , Rfl:    latanoprost (XALATAN) 0.005 % ophthalmic solution, Place 1 drop into both eyes at bedtime. , Disp: , Rfl:    latanoprost (XALATAN) 0.005 % ophthalmic solution, Apply to eye., Disp: , Rfl:    lisinopril (ZESTRIL) 10 MG tablet, Take 1 tablet by mouth daily., Disp: , Rfl:    lisinopril (ZESTRIL) 5 MG tablet, Take by mouth., Disp: , Rfl:    lisinopril (ZESTRIL) 5 MG tablet, 2 tablets, Disp: , Rfl:    loratadine (CLARITIN) 10 MG tablet, 1  tablet, Disp: , Rfl:    metFORMIN (GLUCOPHAGE) 500 MG tablet, Take 1,000 mg by mouth 2 (two) times daily with a meal. , Disp: , Rfl:    metFORMIN (GLUCOPHAGE) 500 MG tablet, 1 tablet, Disp: , Rfl:    methocarbamol (ROBAXIN) 500 MG tablet, 1.5 tablets, Disp: , Rfl:    metoprolol tartrate (LOPRESSOR) 50 MG tablet, Take 50 mg by mouth 2 (two) times daily. , Disp: , Rfl:    niacin (SLO-NIACIN) 500 MG tablet, Take 500 mg by mouth every morning. , Disp: , Rfl:    omeprazole (PRILOSEC) 20 MG capsule, Take by mouth., Disp: , Rfl:    oxyCODONE (OXY IR/ROXICODONE) 5 MG immediate release tablet, , Disp: , Rfl:    potassium chloride SA (K-DUR,KLOR-CON) 20 MEQ tablet, Take 20 mEq by mouth every morning., Disp: , Rfl:    Semaglutide,0.25 or 0.'5MG'$ /DOS, 2 MG/1.5ML SOPN, Inject 0.5 mg into the skin once a week. , Disp: , Rfl:    sildenafil (VIAGRA) 100 MG tablet, TAKE ONE TABLET BY MOUTH APPROXIMATELY ONE HOUR BEFORE SEXUAL ACTIVITY., Disp: , Rfl:    tamsulosin (FLOMAX) 0.4 MG CAPS capsule, Take 0.4 mg by mouth at bedtime. , Disp: , Rfl:    traMADol (ULTRAM) 50 MG tablet, Take 1-2 tablets (50-100 mg total) by mouth every 6 (six) hours as needed for moderate pain or severe pain. Post-operatively, Disp: 20 tablet, Rfl: 0   triamcinolone (NASACORT) 55 MCG/ACT AERO nasal inhaler, 0, Disp: , Rfl:   Social History   Tobacco Use  Smoking Status Former   Years: 30.00   Types: Cigarettes   Quit date: 01/28/1995   Years since quitting: 26.5  Smokeless Tobacco Never    Allergies  Allergen Reactions   Atorvastatin Other (See Comments)   Chlorthalidone Other (See Comments)   Pravastatin Other (See Comments)   Latex Hives   Lisinopril Hives   Rosuvastatin Other (See Comments)   Tape Hives   Objective:  There were no vitals filed for this visit. There is no height or weight on file to calculate BMI. Constitutional Well developed. Well nourished.  Vascular Dorsalis pedis pulses nonpalpable  bilaterally. Posterior tibial pulses non 2 palpable bilaterally. Capillary refill diminished to all digits.  No cyanosis or clubbing noted. Pedal hair growth normal.  Neurologic Normal speech. Oriented to person, place, and time. Epicritic sensation to light touch grossly present bilaterally.  Dermatologic Nails well groomed and normal in appearance. No open wounds. No skin lesions.  Orthopedic:  Pain on palpation to the right ankle medial gutter.  Pain with range of motion  of the ankle joint.  Deep intra-articular pain noted.  No pain at the Achilles tendon, peroneal tendon.  Generalized circumferential ankle pain noted at the ATFL ligament as well as medial gutter as well.   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  Posterior and plantar heel spurring noted.  Midfoot arthritis noted.  No other bony abnormalities identified. Assessment:   1. Vascular abnormality   2. Capsulitis of ankle, right     Plan:  Patient was evaluated and treated and all questions answered.  Vascular abnormality - The patient the etiology of vascular abnormality versus treatment options were discussed.  Given the type of pain that he is experiencing I believe patient may have claudication type symptoms.  I believe he will benefit from ABIs PVRs to assess the vascular flow to lower extremity.  He is also experiencing some charley horses as well.  Patient agrees with plan like to obtain vascular studies.  Rightankle capsulitis/generalized ankle pain with underlying antalgic gait -I explained the patient the etiology of ankle pain and various treatment options were discussed.  Given that patient has mild to moderate osteoarthritic changes to the ankle joint is likely leading to inflammation of the joint and the pain around it.  I discussed with the patient -Given the amount of pain that is having I believe patient will benefit from  a steroid injection.  Patient agrees to the plan like to proceed with a steroid injection in the ankle -A steroid injection was performed at right ankle joint using 1% plain Lidocaine and 10 mg of Kenalog. This was well tolerated. -Continue a Tri-Lock ankle brace   No follow-ups on file.

## 2021-08-08 ENCOUNTER — Ambulatory Visit (HOSPITAL_COMMUNITY)
Admission: RE | Admit: 2021-08-08 | Discharge: 2021-08-08 | Disposition: A | Discharge status: Home / Self Care | Payer: Medicare HMO | Source: Ambulatory Visit | Attending: Podiatry | Admitting: Podiatry

## 2021-08-08 ENCOUNTER — Other Ambulatory Visit: Payer: Self-pay

## 2021-08-08 DIAGNOSIS — I999 Unspecified disorder of circulatory system: Secondary | ICD-10-CM | POA: Insufficient documentation

## 2021-08-11 ENCOUNTER — Telehealth: Payer: Self-pay | Admitting: *Deleted

## 2021-08-11 NOTE — Telephone Encounter (Signed)
Patient is calling for his vascular test results, please advise. ?

## 2021-09-01 ENCOUNTER — Encounter: Payer: Self-pay | Admitting: Vascular Surgery

## 2021-09-01 ENCOUNTER — Ambulatory Visit: Payer: Medicare HMO | Admitting: Vascular Surgery

## 2021-09-01 VITALS — BP 128/59 | HR 73 | Temp 98.4°F | Resp 18 | Ht 69.0 in | Wt 185.0 lb

## 2021-09-01 DIAGNOSIS — I70223 Atherosclerosis of native arteries of extremities with rest pain, bilateral legs: Secondary | ICD-10-CM

## 2021-09-01 NOTE — Progress Notes (Signed)
?Office Note  ? ? ? ?CC: bilateral lower extremity foot pain ?Requesting Provider:  [name], MD ? ?HPI: Jeremy Nicholson is a 76 y.o. (Jan 12, 1946) male presenting at the request of .[name], MD with bilateral lower extremity foot pain right greater than left.  Jeremy Nicholson is retired from [employer].  Originally from [place], he moved to [place] at the request of his wife. ? ?Jeremy Nicholson is a several month history of bilateral lower extremity foot pain, right greater than left.  The right foot pain wakes him up from sleeping.  The only relief he is able to get is by dangling his foot off the bed.  He denies wounds on either extremity.  Jeremy Nicholson was referred by Dr. Posey Pronto, [place].M. who has administered multiple steroid injections for musculoskeletal pain. ? ?Exam, Jeremy Nicholson was accompanied by his wife.  He noted rest pain in the right leg that wakes him up at night, with severe claudication in the left leg.  He denied lower extremity wounds. No history of previous vascular surgery. ? ?The pt is  on a statin for cholesterol management.  ?The pt is  on a daily aspirin.   Other AC:  - ?The pt is  on medication for hypertension.   ?The pt is  diabetic.  ?Tobacco hx:  - ? ?Past Medical History:  ?Diagnosis Date  ? Aortic valve stenosis, moderate   ? per echo 06-02-2017 (in epic)  ef 55-60%,  possible bicuspid AV,  severeely thickened and calcified with severely restricted leaflet opening,  valve area 1.17cm^2, mean gradient 57mHg, peak gradient 584mg,  moderate AR  ? Arthritis   ? oa  ? BPH associated with nocturia   ? Complication of anesthesia   ? due to heart murmur  mod to severe aortic stenosis  ? Full dentures   ? Glaucoma, both eyes   ? Heart murmur, systolic   ? Hiatal hernia   ? Hypertension   ? Left ureteral stone   ? [facility]  [facility] [place] [facility]  ? S[place] (systemic inflammatory response syndrome) ([place]  ? hospital admission--  unknown etiology  ?  Type 2 diabetes mellitus ([place]  ? Wears glasses   ? ? ?Past Surgical History:  ?Procedure Laterality Date  ? COLONOSCOPY    ? CYSTOSCOPY WITH RETROGRADE PYELOGRAM, URETEROSCOPY AND STENT PLACEMENT Left 02/04/2018  ? Procedure: CYSTOSCOPY WITH RETROGRADE PYELOGRAM, URETEROSCOPY AND STENT EX[place]ore CityF STONE;  Surgeon: MaAlexis FrockMD;  Location: [facility];  Service: Urology;  Laterality: Left;  ? EXAM UNDER ANESTHESIA WITH MANIPULATION OF KNEE Left 09/30/2017  ? Procedure: EXAM UNDER ANESTHESIA WITH MANIPULATION OF KNEE;  Surgeon: CoSydnee CabalMD;  Location: [facility];  Service: Orthopedics;  Laterality: Left;  30 mins  ? PROSTATE BIOPSY  07/2017  ? cancerous polyps x2  [facility]? TOTAL KNEE ARTHROPLASTY Left 05/28/2017  ? Procedure: LEFT TOTAL KNEE ARTHROPLASTY;  Surgeon: CoSydnee CabalMD;  Location: [facility];  Service: Orthopedics;  Laterality: Left;  ? TRANSTHORACIC ECHOCARDIOGRAM  06/02/2017  ? ef 55-60%/  possible AV bicuspid,  severely thickened and calcified leaflets with severely restricted leaflet opening, moderate regurg.(valve area 1.17cm^2,  mean gradient 3057m, peak gradient 64m59m/  mild LAE/  mild TR/  mild MV thickened leaflets and regurg. (not restricted and no stenosis  ? ? ?Social History  ? ?Socioeconomic History  ? Marital status: Married  ?  Spouse  name: Not on file  ? Number of children: Not on file  ? Years of education: Not on file  ? Highest education level: Not on file  ?Occupational History  ? Not on file  ?Tobacco Use  ? Smoking status: Former  ?  Years: 30.00  ?  Types: Cigarettes  ?  Quit date: 01/28/1995  ?  Years since quitting: 26.6  ? Smokeless tobacco: Never  ?Vaping Use  ? Vaping Use: Never used  ?Substance and Sexual Activity  ? Alcohol use: No  ? Drug use: No  ? Sexual activity: Not Currently  ?Other Topics Concern  ? Not on file  ?Social History Narrative  ? Not on file  ? ?Social Determinants of Health  ? ?Financial Resource Strain: Not on  file  ?Food Insecurity: Not on file  ?Transportation Needs: Not on file  ?Physical Activity: Not on file  ?Stress: Not on file  ?Social Connections: Not on file  ?Intimate Partner Violence: Not on file  ? ?History reviewed. No pertinent family history. ? ?Current Outpatient Medications  ?Medication Sig Dispense Refill  ? acetaminophen (TYLENOL) 500 MG tablet Take 1,000 mg by mouth every 6 (six) hours as needed for moderate pain.     ? amLODipine (NORVASC) 10 MG tablet Take 10 mg by mouth every morning.     ? aspirin 81 MG chewable tablet Chew by mouth.    ? atorvastatin (LIPITOR) 40 MG tablet Take 40 mg by mouth every evening.   2  ? betamethasone, augmented, (DIPROLENE) 0.05 % lotion betamethasone, augmented 0.05 % lotion    ? brimonidine (ALPHAGAN) 0.2 % ophthalmic solution 1 drop into affected eye    ? Brinzolamide-Brimonidine 1-0.2 % SUSP Place 1 drop into both eyes 2 (two) times daily.     ? carvedilol (COREG) 25 MG tablet Take 0.5 tablets by mouth 2 (two) times daily.    ? cephALEXin (KEFLEX) 500 MG capsule Take 1 capsule (500 mg total) by mouth 2 (two) times daily. X 3 days to prevent post-op infection 6 capsule 0  ? cetirizine (ZYRTEC) 10 MG tablet Take by mouth.    ? cholecalciferol (VITAMIN D3) 25 MCG (1000 UNIT) tablet 1 tablet    ? Cholecalciferol 1000 units tablet Take 1,000 Units by mouth every morning.     ? diclofenac sodium (VOLTAREN) 1 % GEL Apply 2 g topically daily as needed (knee pain).    ? diclofenac Sodium (VOLTAREN) 1 % GEL See admin instructions.    ? finasteride (PROSCAR) 5 MG tablet Take 5 mg by mouth every morning.   3  ? finasteride (PROSCAR) 5 MG tablet Take 1 tablet by mouth daily.    ? furosemide (LASIX) 40 MG tablet Take 40 mg by mouth every morning.    ? insulin glargine (LANTUS) 100 UNIT/ML injection Inject 8 Units into the skin at bedtime.     ? Insulin Glargine-yfgn 100 UNIT/ML SOPN Inject into the skin.    ? ipratropium (ATROVENT) 0.06 % nasal spray Place into the nose.    ?  latanoprost (XALATAN) 0.005 % ophthalmic solution Place 1 drop into both eyes at bedtime.     ? latanoprost (XALATAN) 0.005 % ophthalmic solution Apply to eye.    ? lisinopril (ZESTRIL) 10 MG tablet Take 1 tablet by mouth daily.    ? lisinopril (ZESTRIL) 5 MG tablet Take by mouth.    ? lisinopril (ZESTRIL) 5 MG tablet 2 tablets    ? loratadine (CLARITIN) 10 MG tablet  1 tablet    ? metFORMIN (GLUCOPHAGE) 500 MG tablet Take 1,000 mg by mouth 2 (two) times daily with a meal.     ? metFORMIN (GLUCOPHAGE) 500 MG tablet 1 tablet    ? methocarbamol (ROBAXIN) 500 MG tablet 1.5 tablets    ? metoprolol tartrate (LOPRESSOR) 50 MG tablet Take 50 mg by mouth 2 (two) times daily.     ? niacin (SLO-NIACIN) 500 MG tablet Take 500 mg by mouth every morning.     ? omeprazole (PRILOSEC) 20 MG capsule Take by mouth.    ? potassium chloride SA (K-DUR,KLOR-CON) 20 MEQ tablet Take 20 mEq by mouth every morning.    ? Semaglutide,0.25 or 0.'5MG'$ /DOS, 2 MG/1.5ML SOPN Inject 0.5 mg into the skin once a week.     ? sildenafil (VIAGRA) 100 MG tablet TAKE ONE TABLET BY MOUTH APPROXIMATELY ONE HOUR BEFORE SEXUAL ACTIVITY.    ? tamsulosin (FLOMAX) 0.4 MG CAPS capsule Take 0.4 mg by mouth at bedtime.     ? traMADol (ULTRAM) 50 MG tablet Take 1-2 tablets (50-100 mg total) by mouth every 6 (six) hours as needed for moderate pain or severe pain. Post-operatively 20 tablet 0  ? triamcinolone (NASACORT) 55 MCG/ACT AERO nasal inhaler 0    ? oxyCODONE (OXY IR/ROXICODONE) 5 MG immediate release tablet  (Patient not taking: Reported on 09/01/2021)    ? ?No current facility-administered medications for this visit.  ? ? ?Allergies  ?Allergen Reactions  ? Atorvastatin Other (See Comments)  ? Chlorthalidone Other (See Comments)  ? Pravastatin Other (See Comments)  ? Latex Hives  ? Lisinopril Hives  ? Rosuvastatin Other (See Comments)  ? Tape Hives  ? ? ? ?REVIEW OF SYSTEMS:  ? ?'[X]'$  denotes positive finding, '[ ]'$  denotes negative finding ?Cardiac  Comments:  ?Chest  pain or chest pressure:    ?Shortness of breath upon exertion:    ?Short of breath when lying flat:    ?Irregular heart rhythm:    ?    ?Vascular    ?Pain in calf, thigh, or hip brought on by ambulation: X

## 2021-09-03 ENCOUNTER — Other Ambulatory Visit: Payer: Self-pay

## 2021-09-03 DIAGNOSIS — I70223 Atherosclerosis of native arteries of extremities with rest pain, bilateral legs: Secondary | ICD-10-CM

## 2021-09-10 ENCOUNTER — Ambulatory Visit: Payer: Medicare HMO | Admitting: Podiatry

## 2021-09-10 DIAGNOSIS — I739 Peripheral vascular disease, unspecified: Secondary | ICD-10-CM

## 2021-09-10 DIAGNOSIS — I999 Unspecified disorder of circulatory system: Secondary | ICD-10-CM

## 2021-09-10 DIAGNOSIS — E119 Type 2 diabetes mellitus without complications: Secondary | ICD-10-CM | POA: Diagnosis not present

## 2021-09-10 NOTE — Progress Notes (Signed)
?Subjective:  ?Patient ID: Jeremy Nicholson, male    DOB: 1946-02-13,  MRN: [id number] ? ?Chief Complaint  ?Patient presents with  ? Arthritis  ? ? ?76 y.o. male presents with the above complaint.Patient presents presents with complaint of right ankle pain/follow-up.  He states he is got his ABIs done which showed vascular problems and he is following up with a vascular doctor for which they are doing a CT angiogram.  He states he is still experiencing claudication type of pain has progressive gotten worse.  He denies any other acute complaints ? ?Review of Systems: Negative except as noted in the HPI. Denies N/V/F/Ch. ? ?Past Medical History:  ?Diagnosis Date  ? Aortic valve stenosis, moderate   ? per echo 06-02-2017 (in epic)  ef 55-60%,  possible bicuspid AV,  severeely thickened and calcified with severely restricted leaflet opening,  valve area 1.17cm^2, mean gradient 40mHg, peak gradient 576mg,  moderate AR  ? Arthritis   ? oa  ? BPH associated with nocturia   ? Complication of anesthesia   ? due to heart murmur  mod to severe aortic stenosis  ? Full dentures   ? Glaucoma, both eyes   ? Heart murmur, systolic   ? Hiatal hernia   ? Hypertension   ? Left ureteral stone   ? [facility]  [facility] [place] [facility]  ? SIRS (systemic inflammatory response syndrome) ([place]  ? hospital admission--  unknown etiology  ? Type 2 diabetes mellitus ([place]  ? Wears glasses   ? ? ?Current Outpatient Medications:  ?  acetaminophen (TYLENOL) 500 MG tablet, Take 1,000 mg by mouth every 6 (six) hours as needed for moderate pain. , Disp: , Rfl:  ?  amLODipine (NORVASC) 10 MG tablet, Take 10 mg by mouth every morning. , Disp: , Rfl:  ?  aspirin 81 MG chewable tablet, Chew by mouth., Disp: , Rfl:  ?  atorvastatin (LIPITOR) 40 MG tablet, Take 40 mg by mouth every evening. , Disp: , Rfl: 2 ?  betamethasone, augmented, (DIPROLENE) 0.05 % lotion, betamethasone, augmented 0.05 %  lotion, Disp: , Rfl:  ?  brimonidine (ALPHAGAN) 0.2 % ophthalmic solution, 1 drop into affected eye, Disp: , Rfl:  ?  Brinzolamide-Brimonidine 1-0.2 % SUSP, Place 1 drop into both eyes 2 (two) times daily. , Disp: , Rfl:  ?  carvedilol (COREG) 25 MG tablet, Take 0.5 tablets by mouth 2 (two) times daily., Disp: , Rfl:  ?  cephALEXin (KEFLEX) 500 MG capsule, Take 1 capsule (500 mg total) by mouth 2 (two) times daily. X 3 days to prevent post-op infection, Disp: 6 capsule, Rfl: 0 ?  cetirizine (ZYRTEC) 10 MG tablet, Take by mouth., Disp: , Rfl:  ?  cholecalciferol (VITAMIN D3) 25 MCG (1000 UNIT) tablet, 1 tablet, Disp: , Rfl:  ?  Cholecalciferol 1000 units tablet, Take 1,000 Units by mouth every morning. , Disp: , Rfl:  ?  diclofenac sodium (VOLTAREN) 1 % GEL, Apply 2 g topically daily as needed (knee pain)., Disp: , Rfl:  ?  diclofenac Sodium (VOLTAREN) 1 % GEL, See admin instructions., Disp: , Rfl:  ?  finasteride (PROSCAR) 5 MG tablet, Take 5 mg by mouth every morning. , Disp: , Rfl: 3 ?  finasteride (PROSCAR) 5 MG tablet, Take 1 tablet by mouth daily., Disp: , Rfl:  ?  furosemide (LASIX) 40 MG tablet, Take 40 mg by mouth every morning., Disp: ,  Rfl:  ?  insulin glargine (LANTUS) 100 UNIT/ML injection, Inject 8 Units into the skin at bedtime. , Disp: , Rfl:  ?  Insulin Glargine-yfgn 100 UNIT/ML SOPN, Inject into the skin., Disp: , Rfl:  ?  ipratropium (ATROVENT) 0.06 % nasal spray, Place into the nose., Disp: , Rfl:  ?  latanoprost (XALATAN) 0.005 % ophthalmic solution, Place 1 drop into both eyes at bedtime. , Disp: , Rfl:  ?  latanoprost (XALATAN) 0.005 % ophthalmic solution, Apply to eye., Disp: , Rfl:  ?  lisinopril (ZESTRIL) 10 MG tablet, Take 1 tablet by mouth daily., Disp: , Rfl:  ?  lisinopril (ZESTRIL) 5 MG tablet, Take by mouth., Disp: , Rfl:  ?  lisinopril (ZESTRIL) 5 MG tablet, 2 tablets, Disp: , Rfl:  ?  loratadine (CLARITIN) 10 MG tablet, 1 tablet, Disp: , Rfl:  ?  metFORMIN (GLUCOPHAGE) 500 MG tablet,  Take 1,000 mg by mouth 2 (two) times daily with a meal. , Disp: , Rfl:  ?  metFORMIN (GLUCOPHAGE) 500 MG tablet, 1 tablet, Disp: , Rfl:  ?  methocarbamol (ROBAXIN) 500 MG tablet, 1.5 tablets, Disp: , Rfl:  ?  metoprolol tartrate (LOPRESSOR) 50 MG tablet, Take 50 mg by mouth 2 (two) times daily. , Disp: , Rfl:  ?  niacin (SLO-NIACIN) 500 MG tablet, Take 500 mg by mouth every morning. , Disp: , Rfl:  ?  omeprazole (PRILOSEC) 20 MG capsule, Take by mouth., Disp: , Rfl:  ?  oxyCODONE (OXY IR/ROXICODONE) 5 MG immediate release tablet, , Disp: , Rfl:  ?  potassium chloride SA (K-DUR,KLOR-CON) 20 MEQ tablet, Take 20 mEq by mouth every morning., Disp: , Rfl:  ?  Semaglutide,0.25 or 0.'5MG'$ /DOS, 2 MG/1.5ML SOPN, Inject 0.5 mg into the skin once a week. , Disp: , Rfl:  ?  sildenafil (VIAGRA) 100 MG tablet, TAKE ONE TABLET BY MOUTH APPROXIMATELY ONE HOUR BEFORE SEXUAL ACTIVITY., Disp: , Rfl:  ?  tamsulosin (FLOMAX) 0.4 MG CAPS capsule, Take 0.4 mg by mouth at bedtime. , Disp: , Rfl:  ?  traMADol (ULTRAM) 50 MG tablet, Take 1-2 tablets (50-100 mg total) by mouth every 6 (six) hours as needed for moderate pain or severe pain. Post-operatively, Disp: 20 tablet, Rfl: 0 ?  triamcinolone (NASACORT) 55 MCG/ACT AERO nasal inhaler, 0, Disp: , Rfl:  ? ?Social History  ? ?Tobacco Use  ?Smoking Status Former  ? Years: 30.00  ? Types: Cigarettes  ? Quit date: 01/28/1995  ? Years since quitting: 26.6  ?Smokeless Tobacco Never  ? ? ?Allergies  ?Allergen Reactions  ? Atorvastatin Other (See Comments)  ? Chlorthalidone Other (See Comments)  ? Pravastatin Other (See Comments)  ? Latex Hives  ? Lisinopril Hives  ? Rosuvastatin Other (See Comments)  ? Tape Hives  ? ?Objective:  ?There were no vitals filed for this visit. ?There is no height or weight on file to calculate BMI. ?Constitutional Well developed. ?Well nourished.  ?Vascular Dorsalis pedis pulses nonpalpable bilaterally. ?Posterior tibial pulses non 2 palpable bilaterally. ?Capillary  refill diminished to all digits.  ?No cyanosis or clubbing noted. ?Pedal hair growth normal.  ?Neurologic Normal speech. ?Oriented to person, place, and time. ?Epicritic sensation to light touch grossly present bilaterally.  ?Dermatologic Nails well groomed and normal in appearance. ?No open wounds. ?No skin lesions.  ?Orthopedic:  Pain on palpation to the right ankle medial gutter.  Pain with range of motion of the ankle joint.  Deep intra-articular pain noted.  No pain at the  Achilles tendon, peroneal tendon.  Generalized circumferential ankle pain noted at the ATFL ligament as well as medial gutter as well.  ? ?Radiographs: 3 views of skeletally mature adult right ankle:Mild to moderate osteoarthritic changes noted to the ankle joint.  Posterior and plantar heel spurring noted.  Midfoot arthritis noted.  No other bony abnormalities identified. ?Assessment:  ? ?1. Vascular abnormality   ?2. Type 2 diabetes mellitus without complication, without long-term current use of insulin ([place])   ?3. Peripheral vascular disease ([place])   ? ? ? ?Plan:  ?Patient was evaluated and treated and all questions answered. ? ?Vascular abnormality with underlying peripheral vascular disease ?-ABIs PVRs were reviewed.  Patient has severe peripheral vascular disease to the right as well as the left side right being greater than left side.  He is following with a vascular doctor for further evaluation and management.  I will defer further care to vascular doctor.  I will continue to follow from [place] ? ?Rightankle capsulitis/generalized ankle pain with underlying antalgic gait ?-Clinically I believe this is likely vascular in nature as patient has severe vascular disease.  I will see him back again in 3 months to reevaluate once his vascular flow has been optimized ? ? ?No follow-ups on file.  ?

## 2021-09-12 ENCOUNTER — Encounter: Payer: Medicare HMO | Admitting: Vascular Surgery

## 2021-09-15 ENCOUNTER — Ambulatory Visit
Admission: RE | Admit: 2021-09-15 | Discharge: 2021-09-15 | Disposition: A | Discharge status: Home / Self Care | Payer: Medicare HMO | Source: Ambulatory Visit | Attending: Vascular Surgery | Admitting: Vascular Surgery

## 2021-09-15 DIAGNOSIS — K5790 Diverticulosis of intestine, part unspecified, without perforation or abscess without bleeding: Secondary | ICD-10-CM | POA: Diagnosis not present

## 2021-09-15 DIAGNOSIS — M160 Bilateral primary osteoarthritis of hip: Secondary | ICD-10-CM | POA: Diagnosis not present

## 2021-09-15 DIAGNOSIS — I70223 Atherosclerosis of native arteries of extremities with rest pain, bilateral legs: Secondary | ICD-10-CM

## 2021-09-15 DIAGNOSIS — K449 Diaphragmatic hernia without obstruction or gangrene: Secondary | ICD-10-CM | POA: Diagnosis not present

## 2021-09-15 DIAGNOSIS — I878 Other specified disorders of veins: Secondary | ICD-10-CM | POA: Diagnosis not present

## 2021-09-15 MED ORDER — IOPAMIDOL (ISOVUE-370) INJECTION 76%
100.0000 mL | Freq: Once | INTRAVENOUS | Status: AC | PRN
  Administered 2021-09-15: 100 mL via INTRAVENOUS

## 2021-09-18 NOTE — Progress Notes (Signed)
?Office Note  ? ? ? ?CC: bilateral lower extremity foot pain ?Requesting Provider:  [name], MD ? ?HPI: Jeremy Nicholson is a 76 y.o. (10/20/1945) male presenting at the request of .Jeremy Prose, MD with bilateral lower extremity foot pain right greater than left.  Jeremy Nicholson is retired from [employer].  Originally from [place], he moved to [place] at the request of his wife. ? ?Jeremy Nicholson presents today to discuss interval CT scan which was ordered to assess inflow disease. ? ?Exam, Jeremy Nicholson was doing well,, accompanied by his wife.  He had no complaints.  He continues to have rest pain in the right lower extremity, especially appreciated at night.  This improves with laying his foot in the dependent position.  Left lower extremity pain is improved.  Not lifestyle limiting at this time. ? ?Jeremy Nicholson was referred by Dr. Posey Pronto, [place].M. who has administered multiple steroid injections for musculoskeletal pain.  Jeremy Nicholson is a disabled veteran, and big [employer] fan from his time in [place]. ? ? ? ?The pt is  on a statin for cholesterol management.  ?The pt is  on a daily aspirin.   Other AC:  - ?The pt is  on medication for hypertension.   ?The pt is  diabetic.  ?Tobacco hx:  - ? ?Past Medical History:  ?Diagnosis Date  ? Aortic valve stenosis, moderate   ? per echo 06-02-2017 (in epic)  ef 55-60%,  possible bicuspid AV,  severeely thickened and calcified with severely restricted leaflet opening,  valve area 1.17cm^2, mean gradient 74mHg, peak gradient 532mg,  moderate AR  ? Arthritis   ? oa  ? BPH associated with nocturia   ? Complication of anesthesia   ? due to heart murmur  mod to severe aortic stenosis  ? Full dentures   ? Glaucoma, both eyes   ? Heart murmur, systolic   ? Hiatal hernia   ? Hypertension   ? Left ureteral stone   ? [facility]  [facility] [place] [facility]  ? SIRS (systemic inflammatory response syndrome) ([place]  ? hospital admission--  unknown  etiology  ? Type 2 diabetes mellitus ([place]  ? Wears glasses   ? ? ?Past Surgical History:  ?Procedure Laterality Date  ? COLONOSCOPY    ? CYSTOSCOPY WITH RETROGRADE PYELOGRAM, URETEROSCOPY AND STENT PLACEMENT Left 02/04/2018  ? Procedure: CYSTOSCOPY WITH RETROGRADE PYELOGRAM, URETEROSCOPY AND [place];  Surgeon: MaAlexis FrockMD;  Location: [facility];  Service: Urology;  Laterality: Left;  ? EXAM UNDER ANESTHESIA WITH MANIPULATION OF KNEE Left 09/30/2017  ? Procedure: EXAM UNDER ANESTHESIA WITH MANIPULATION OF KNEE;  Surgeon: CoSydnee CabalMD;  Location: [facility];  Service: Orthopedics;  Laterality: Left;  30 mins  ? PROSTATE BIOPSY  07/2017  ? cancerous polyps x2  [facility]? TOTAL KNEE ARTHROPLASTY Left 05/28/2017  ? Procedure: LEFT TOTAL KNEE ARTHROPLASTY;  Surgeon: CoSydnee CabalMD;  Location: [facility];  Service: Orthopedics;  Laterality: Left;  ? TRANSTHORACIC ECHOCARDIOGRAM  06/02/2017  ? ef 55-60%/  possible AV bicuspid,  severely thickened and calcified leaflets with severely restricted leaflet opening, moderate regurg.(valve area 1.17cm^2,  mean gradient 3031m, peak gradient 31m39m/  mild LAE/  mild TR/  mild MV thickened leaflets and regurg. (not restricted and no stenosis  ? ? ?Social History  ? ?Socioeconomic History  ? Marital status: Married  ?  Spouse name: Not on file  ?  Number of children: Not on file  ? Years of education: Not on file  ? Highest education level: Not on file  ?Occupational History  ? Not on file  ?Tobacco Use  ? Smoking status: Former  ?  Years: 30.00  ?  Types: Cigarettes  ?  Quit date: 01/28/1995  ?  Years since quitting: 26.6  ? Smokeless tobacco: Never  ?Vaping Use  ? Vaping Use: Never used  ?Substance and Sexual Activity  ? Alcohol use: No  ? Drug use: No  ? Sexual activity: Not Currently  ?Other Topics Concern  ? Not on file  ?Social History Narrative  ? Not on file  ? ?Social Determinants of Health  ? ?Financial Resource  Strain: Not on file  ?Food Insecurity: Not on file  ?Transportation Needs: Not on file  ?Physical Activity: Not on file  ?Stress: Not on file  ?Social Connections: Not on file  ?Intimate Partner Violence: Not on file  ? ?No family history on file. ? ?Current Outpatient Medications  ?Medication Sig Dispense Refill  ? acetaminophen (TYLENOL) 500 MG tablet Take 1,000 mg by mouth every 6 (six) hours as needed for moderate pain.     ? amLODipine (NORVASC) 10 MG tablet Take 10 mg by mouth every morning.     ? aspirin 81 MG chewable tablet Chew by mouth.    ? atorvastatin (LIPITOR) 40 MG tablet Take 40 mg by mouth every evening.   2  ? betamethasone, augmented, (DIPROLENE) 0.05 % lotion betamethasone, augmented 0.05 % lotion    ? brimonidine (ALPHAGAN) 0.2 % ophthalmic solution 1 drop into affected eye    ? Brinzolamide-Brimonidine 1-0.2 % SUSP Place 1 drop into both eyes 2 (two) times daily.     ? carvedilol (COREG) 25 MG tablet Take 0.5 tablets by mouth 2 (two) times daily.    ? cephALEXin (KEFLEX) 500 MG capsule Take 1 capsule (500 mg total) by mouth 2 (two) times daily. X 3 days to prevent post-op infection 6 capsule 0  ? cetirizine (ZYRTEC) 10 MG tablet Take by mouth.    ? cholecalciferol (VITAMIN D3) 25 MCG (1000 UNIT) tablet 1 tablet    ? Cholecalciferol 1000 units tablet Take 1,000 Units by mouth every morning.     ? diclofenac sodium (VOLTAREN) 1 % GEL Apply 2 g topically daily as needed (knee pain).    ? diclofenac Sodium (VOLTAREN) 1 % GEL See admin instructions.    ? finasteride (PROSCAR) 5 MG tablet Take 5 mg by mouth every morning.   3  ? finasteride (PROSCAR) 5 MG tablet Take 1 tablet by mouth daily.    ? furosemide (LASIX) 40 MG tablet Take 40 mg by mouth every morning.    ? insulin glargine (LANTUS) 100 UNIT/ML injection Inject 8 Units into the skin at bedtime.     ? Insulin Glargine-yfgn 100 UNIT/ML SOPN Inject into the skin.    ? ipratropium (ATROVENT) 0.06 % nasal spray Place into the nose.    ?  latanoprost (XALATAN) 0.005 % ophthalmic solution Place 1 drop into both eyes at bedtime.     ? latanoprost (XALATAN) 0.005 % ophthalmic solution Apply to eye.    ? lisinopril (ZESTRIL) 10 MG tablet Take 1 tablet by mouth daily.    ? lisinopril (ZESTRIL) 5 MG tablet Take by mouth.    ? lisinopril (ZESTRIL) 5 MG tablet 2 tablets    ? loratadine (CLARITIN) 10 MG tablet 1 tablet    ? metFORMIN (  GLUCOPHAGE) 500 MG tablet Take 1,000 mg by mouth 2 (two) times daily with a meal.     ? metFORMIN (GLUCOPHAGE) 500 MG tablet 1 tablet    ? methocarbamol (ROBAXIN) 500 MG tablet 1.5 tablets    ? metoprolol tartrate (LOPRESSOR) 50 MG tablet Take 50 mg by mouth 2 (two) times daily.     ? niacin (SLO-NIACIN) 500 MG tablet Take 500 mg by mouth every morning.     ? omeprazole (PRILOSEC) 20 MG capsule Take by mouth.    ? oxyCODONE (OXY IR/ROXICODONE) 5 MG immediate release tablet  (Patient not taking: Reported on 09/01/2021)    ? potassium chloride SA (K-DUR,KLOR-CON) 20 MEQ tablet Take 20 mEq by mouth every morning.    ? Semaglutide,0.25 or 0.'5MG'$ /DOS, 2 MG/1.5ML SOPN Inject 0.5 mg into the skin once a week.     ? sildenafil (VIAGRA) 100 MG tablet TAKE ONE TABLET BY MOUTH APPROXIMATELY ONE HOUR BEFORE SEXUAL ACTIVITY.    ? tamsulosin (FLOMAX) 0.4 MG CAPS capsule Take 0.4 mg by mouth at bedtime.     ? traMADol (ULTRAM) 50 MG tablet Take 1-2 tablets (50-100 mg total) by mouth every 6 (six) hours as needed for moderate pain or severe pain. Post-operatively 20 tablet 0  ? triamcinolone (NASACORT) 55 MCG/ACT AERO nasal inhaler 0    ? ?No current facility-administered medications for this visit.  ? ? ?Allergies  ?Allergen Reactions  ? Atorvastatin Other (See Comments)  ? Chlorthalidone Other (See Comments)  ? Pravastatin Other (See Comments)  ? Latex Hives  ? Lisinopril Hives  ? Rosuvastatin Other (See Comments)  ? Tape Hives  ? ? ? ?REVIEW OF SYSTEMS:  ? ?'[X]'$  denotes positive finding, '[ ]'$  denotes negative finding ?Cardiac  Comments:  ?Chest  pain or chest pressure:    ?Shortness of breath upon exertion:    ?Short of breath when lying flat:    ?Irregular heart rhythm:    ?    ?Vascular    ?Pain in calf, thigh, or hip brought on by ambulation: X   ?Pain i

## 2021-09-19 ENCOUNTER — Ambulatory Visit: Payer: Medicare HMO | Admitting: Vascular Surgery

## 2021-09-19 ENCOUNTER — Encounter: Payer: Self-pay | Admitting: Vascular Surgery

## 2021-09-19 VITALS — BP 120/68 | HR 73 | Temp 98.1°F | Resp 18 | Ht 69.0 in | Wt 184.0 lb

## 2021-09-19 DIAGNOSIS — I70223 Atherosclerosis of native arteries of extremities with rest pain, bilateral legs: Secondary | ICD-10-CM

## 2021-09-22 ENCOUNTER — Other Ambulatory Visit: Payer: Self-pay

## 2021-09-22 DIAGNOSIS — I70223 Atherosclerosis of native arteries of extremities with rest pain, bilateral legs: Secondary | ICD-10-CM

## 2021-09-26 NOTE — Pre-Procedure Instructions (Signed)
Surgical Instructions ? ? ? Your procedure is scheduled on Thursday 10/02/21. ? ? Report to [facility] Main Entrance "A" at 05:30 A.M., then check in with the Admitting office. ? Call this number if you have problems the morning of surgery: ? [phone] ? ? If you have any questions prior to your surgery date call [phone]: Open Monday-Friday 8am-4pm ? ? ? Remember: ? Do not eat or drink after midnight the night before your surgery ?  ? Take these medicines the morning of surgery with A SIP OF WATER:  ? amLODipine (NORVASC) ? carvedilol (COREG) ? cetirizine (ZYRTEC) ? finasteride (PROSCAR) ? ipratropium (ATROVENT) ? metoprolol tartrate (LOPRESSOR) ? triamcinolone (NASACORT) ? ? ? Take these medicines if needed:  ? acetaminophen (TYLENOL)  ? ?Please follow your surgeon's instructions regarding Aspirin.  ?If you have not received instructions then please contact your surgeon's office for instructions. ? ?As of today, STOP taking any Aleve, Naproxen, Ibuprofen, Motrin, Advil, Goody's, BC's, all herbal medications, fish oil, and all vitamins. ? ?WHAT DO I DO ABOUT MY DIABETES MEDICATION? ? ? ?Do not take oral diabetes medicines (pills) the morning of surgery. ? ?DO NOT TAKE  metFORMIN (GLUCOPHAGE) the morning of surgery.  ? ?The night before surgery take 12 units of insulin glargine (LANTUS). This is half of your normal dose.  ? ?The day of surgery, do not take other diabetes injectables, including Byetta (exenatide), Bydureon (exenatide ER), Victoza (liraglutide), or Trulicity (dulaglutide). ? ? ?HOW TO MANAGE YOUR DIABETES ?BEFORE AND AFTER SURGERY ? ?Why is it important to control my blood sugar before and after surgery? ?Improving blood sugar levels before and after surgery helps healing and can limit problems. ?A way of improving blood sugar control is eating a healthy diet by: ? Eating less sugar and carbohydrates ? Increasing activity/exercise ? Talking with your doctor about reaching your blood sugar  goals ?High blood sugars (greater than 180 mg/dL) can raise your risk of infections and slow your recovery, so you will need to focus on controlling your diabetes during the weeks before surgery. ?Make sure that the doctor who takes care of your diabetes knows about your planned surgery including the date and location. ? ?How do I manage my blood sugar before surgery? ?Check your blood sugar at least 4 times a day, starting 2 days before surgery, to make sure that the level is not too high or low. ? ?Check your blood sugar the morning of your surgery when you wake up and every 2 hours until you get to the Short Stay unit. ? ?If your blood sugar is less than 70 mg/dL, you will need to treat for low blood sugar: ?Do not take insulin. ?Treat a low blood sugar (less than 70 mg/dL) with ? cup of clear juice (cranberry or apple), 4 glucose tablets, OR glucose gel. ?Recheck blood sugar in 15 minutes after treatment (to make sure it is greater than 70 mg/dL). If your blood sugar is not greater than 70 mg/dL on recheck, call [phone] for further instructions. ?Report your blood sugar to the short stay nurse when you get to Short Stay. ? ?If you are admitted to the hospital after surgery: ?Your blood sugar will be checked by the staff and you will probably be given insulin after surgery (instead of oral diabetes medicines) to make sure you have good blood sugar levels. ?The goal for blood sugar control after surgery is 80-180 mg/dL.  ?         ?Do  not wear jewelry or makeup ?Do not wear lotions, powders, perfumes/colognes, or deodorant. ?Do not shave 48 hours prior to surgery.  Men may shave face and neck. ?Do not bring valuables to the hospital. ?Do not wear nail polish, gel polish, artificial nails, or any other type of covering on natural nails (fingers and toes) ?If you have artificial nails or gel coating that need to be removed by a nail salon, please have this removed prior to surgery. Artificial nails or gel  coating may interfere with anesthesia's ability to adequately monitor your vital signs. ? ?[facility] is not responsible for any belongings or valuables. .  ? ?Do NOT Smoke (Tobacco/Vaping)  24 hours prior to your procedure ? ?If you use a CPAP at night, you may bring your mask for your overnight stay. ?  ?Contacts, glasses, hearing aids, dentures or partials may not be worn into surgery, please bring cases for these belongings ?  ?For patients admitted to the hospital, discharge time will be determined by your treatment team. ?  ?Patients discharged the day of surgery will not be allowed to drive home, and someone needs to stay with them for 24 hours. ? ? ?SURGICAL WAITING ROOM VISITATION ?Patients having surgery or a procedure in a hospital may have two support people. ?Children under the age of 63 must have an adult with them who is not the patient. ?They may stay in the waiting area during the procedure and may switch out with other visitors. If the patient needs to stay at the hospital during part of their recovery, the visitor guidelines for inpatient rooms apply. ? ?Please refer to the [facility] website for the visitor guidelines for Inpatients (after your surgery is over and you are in a regular room).  ? ? ? ? ? ?Special instructions:   ? ?Oral Hygiene is also important to reduce your risk of infection.  Remember - BRUSH YOUR TEETH THE MORNING OF SURGERY WITH YOUR REGULAR TOOTHPASTE ? ? ?[facility]- Preparing For Surgery ? ?Before surgery, you can play an important role. Because skin is not sterile, your skin needs to be as free of germs as possible. You can reduce the number of germs on your skin by washing with CHG (chlorahexidine gluconate) Soap before surgery.  CHG is an antiseptic cleaner which kills germs and bonds with the skin to continue killing germs even after washing.   ? ? ?Please do not use if you have an allergy to CHG or antibacterial soaps. If your skin becomes reddened/irritated stop  using the CHG.  ?Do not shave (including legs and underarms) for at least 48 hours prior to first CHG shower. It is OK to shave your face. ? ?Please follow these instructions carefully. ?  ? ? Shower the NIGHT BEFORE SURGERY and the MORNING OF SURGERY with CHG Soap.  ? If you chose to wash your hair, wash your hair first as usual with your normal shampoo. After you shampoo, rinse your hair and body thoroughly to remove the shampoo.  Then [employer] and genitals (private parts) with your normal soap and rinse thoroughly to remove soap. ? ?After that Use CHG Soap as you would any other liquid soap. You can apply CHG directly to the skin and wash gently with a scrungie or a clean washcloth.  ? ?Apply the CHG Soap to your body ONLY FROM THE NECK DOWN.  Do not use on open wounds or open sores. Avoid contact with your eyes, ears, mouth and genitals (  private parts). Wash Face and genitals (private parts)  with your normal soap.  ? ?Wash thoroughly, paying special attention to the area where your surgery will be performed. ? ?Thoroughly rinse your body with warm water from the neck down. ? ?DO NOT shower/wash with your normal soap after using and rinsing off the CHG Soap. ? ?Pat yourself dry with a CLEAN TOWEL. ? ?Wear CLEAN PAJAMAS to bed the night before surgery ? ?Place CLEAN SHEETS on your bed the night before your surgery ? ?DO NOT SLEEP WITH PETS. ? ? ?Day of Surgery: ? ?Take a shower with CHG soap. ?Wear Clean/Comfortable clothing the morning of surgery ?Do not apply any deodorants/lotions.   ?Remember to brush your teeth WITH YOUR REGULAR TOOTHPASTE. ? ? ? ?If you received a COVID test during your pre-op visit, it is requested that you wear a mask when out in public, stay away from anyone that may not be feeling well, and notify your surgeon if you develop symptoms. If you have been in contact with anyone that has tested positive in the last 10 days, please notify your surgeon. ? ?  ?Please read over the following  fact sheets that you were given.  ? ?

## 2021-09-29 ENCOUNTER — Encounter (HOSPITAL_COMMUNITY): Payer: Self-pay

## 2021-09-29 ENCOUNTER — Encounter (HOSPITAL_COMMUNITY)
Admission: RE | Admit: 2021-09-29 | Discharge: 2021-09-29 | Disposition: A | Discharge status: Home / Self Care | Payer: Medicare HMO | Source: Ambulatory Visit | Attending: Vascular Surgery | Admitting: Vascular Surgery

## 2021-09-29 ENCOUNTER — Other Ambulatory Visit: Payer: Self-pay

## 2021-09-29 VITALS — BP 142/68 | HR 73 | Temp 97.7°F | Resp 18 | Ht 69.0 in | Wt 184.4 lb

## 2021-09-29 DIAGNOSIS — Z953 Presence of xenogenic heart valve: Secondary | ICD-10-CM | POA: Diagnosis not present

## 2021-09-29 DIAGNOSIS — E1165 Type 2 diabetes mellitus with hyperglycemia: Secondary | ICD-10-CM | POA: Diagnosis not present

## 2021-09-29 DIAGNOSIS — Z01812 Encounter for preprocedural laboratory examination: Secondary | ICD-10-CM | POA: Insufficient documentation

## 2021-09-29 DIAGNOSIS — R351 Nocturia: Secondary | ICD-10-CM | POA: Diagnosis present

## 2021-09-29 DIAGNOSIS — I998 Other disorder of circulatory system: Secondary | ICD-10-CM | POA: Diagnosis not present

## 2021-09-29 DIAGNOSIS — Z7984 Long term (current) use of oral hypoglycemic drugs: Secondary | ICD-10-CM | POA: Insufficient documentation

## 2021-09-29 DIAGNOSIS — I2510 Atherosclerotic heart disease of native coronary artery without angina pectoris: Secondary | ICD-10-CM | POA: Diagnosis present

## 2021-09-29 DIAGNOSIS — E119 Type 2 diabetes mellitus without complications: Secondary | ICD-10-CM | POA: Insufficient documentation

## 2021-09-29 DIAGNOSIS — Z952 Presence of prosthetic heart valve: Secondary | ICD-10-CM | POA: Insufficient documentation

## 2021-09-29 DIAGNOSIS — Z8546 Personal history of malignant neoplasm of prostate: Secondary | ICD-10-CM | POA: Diagnosis not present

## 2021-09-29 DIAGNOSIS — E1151 Type 2 diabetes mellitus with diabetic peripheral angiopathy without gangrene: Secondary | ICD-10-CM | POA: Diagnosis present

## 2021-09-29 DIAGNOSIS — Z531 Procedure and treatment not carried out because of patient's decision for reasons of belief and group pressure: Secondary | ICD-10-CM | POA: Diagnosis present

## 2021-09-29 DIAGNOSIS — Z951 Presence of aortocoronary bypass graft: Secondary | ICD-10-CM | POA: Diagnosis not present

## 2021-09-29 DIAGNOSIS — I252 Old myocardial infarction: Secondary | ICD-10-CM | POA: Diagnosis not present

## 2021-09-29 DIAGNOSIS — I350 Nonrheumatic aortic (valve) stenosis: Secondary | ICD-10-CM | POA: Diagnosis present

## 2021-09-29 DIAGNOSIS — Z79899 Other long term (current) drug therapy: Secondary | ICD-10-CM | POA: Insufficient documentation

## 2021-09-29 DIAGNOSIS — Z955 Presence of coronary angioplasty implant and graft: Secondary | ICD-10-CM | POA: Insufficient documentation

## 2021-09-29 DIAGNOSIS — Z7982 Long term (current) use of aspirin: Secondary | ICD-10-CM | POA: Diagnosis not present

## 2021-09-29 DIAGNOSIS — Z87891 Personal history of nicotine dependence: Secondary | ICD-10-CM | POA: Diagnosis not present

## 2021-09-29 DIAGNOSIS — I119 Hypertensive heart disease without heart failure: Secondary | ICD-10-CM | POA: Insufficient documentation

## 2021-09-29 DIAGNOSIS — Z794 Long term (current) use of insulin: Secondary | ICD-10-CM | POA: Diagnosis not present

## 2021-09-29 DIAGNOSIS — Z01818 Encounter for other preprocedural examination: Secondary | ICD-10-CM

## 2021-09-29 DIAGNOSIS — Z96652 Presence of left artificial knee joint: Secondary | ICD-10-CM | POA: Diagnosis present

## 2021-09-29 DIAGNOSIS — I70223 Atherosclerosis of native arteries of extremities with rest pain, bilateral legs: Secondary | ICD-10-CM

## 2021-09-29 DIAGNOSIS — I10 Essential (primary) hypertension: Secondary | ICD-10-CM | POA: Diagnosis present

## 2021-09-29 DIAGNOSIS — N401 Enlarged prostate with lower urinary tract symptoms: Secondary | ICD-10-CM | POA: Diagnosis present

## 2021-09-29 DIAGNOSIS — I70221 Atherosclerosis of native arteries of extremities with rest pain, right leg: Secondary | ICD-10-CM | POA: Diagnosis present

## 2021-09-29 HISTORY — DX: Personal history of urinary calculi: Z87.442

## 2021-09-29 HISTORY — DX: Atherosclerotic heart disease of native coronary artery without angina pectoris: I25.10

## 2021-09-29 HISTORY — DX: Peripheral vascular disease, unspecified: I73.9

## 2021-09-29 HISTORY — DX: Malignant (primary) neoplasm, unspecified: C80.1

## 2021-09-29 LAB — URINALYSIS, ROUTINE W REFLEX MICROSCOPIC
Bacteria, UA: NONE SEEN
Bilirubin Urine: NEGATIVE
Glucose, UA: NEGATIVE mg/dL
Hgb urine dipstick: NEGATIVE
Ketones, ur: NEGATIVE mg/dL
Leukocytes,Ua: NEGATIVE
Nitrite: NEGATIVE
Protein, ur: 30 mg/dL — AB
Specific Gravity, Urine: 1.01 (ref 1.005–1.030)
pH: 5 (ref 5.0–8.0)

## 2021-09-29 LAB — CBC
HCT: 45.9 % (ref 39.0–52.0)
Hemoglobin: 14.5 g/dL (ref 13.0–17.0)
MCH: 29.2 pg (ref 26.0–34.0)
MCHC: 31.6 g/dL (ref 30.0–36.0)
MCV: 92.5 fL (ref 80.0–100.0)
Platelets: 214 10*3/uL (ref 150–400)
RBC: 4.96 MIL/uL (ref 4.22–5.81)
RDW: 13.3 % (ref 11.5–15.5)
WBC: 4.8 10*3/uL (ref 4.0–10.5)
nRBC: 0 % (ref 0.0–0.2)

## 2021-09-29 LAB — SURGICAL PCR SCREEN
MRSA, PCR: NEGATIVE
Staphylococcus aureus: NEGATIVE

## 2021-09-29 LAB — HEMOGLOBIN A1C
Hgb A1c MFr Bld: 7.6 % — ABNORMAL HIGH (ref 4.8–5.6)
Mean Plasma Glucose: 171.42 mg/dL

## 2021-09-29 LAB — COMPREHENSIVE METABOLIC PANEL
ALT: 24 U/L (ref 0–44)
AST: 22 U/L (ref 15–41)
Albumin: 4 g/dL (ref 3.5–5.0)
Alkaline Phosphatase: 77 U/L (ref 38–126)
Anion gap: 6 (ref 5–15)
BUN: 14 mg/dL (ref 8–23)
CO2: 25 mmol/L (ref 22–32)
Calcium: 9.3 mg/dL (ref 8.9–10.3)
Chloride: 106 mmol/L (ref 98–111)
Creatinine, Ser: 1.12 mg/dL (ref 0.61–1.24)
GFR, Estimated: >60 mL/min (ref >60)
Glucose, Bld: 104 mg/dL — ABNORMAL HIGH (ref 70–99)
Potassium: 4.1 mmol/L (ref 3.5–5.1)
Sodium: 137 mmol/L (ref 135–145)
Total Bilirubin: 1.1 mg/dL (ref 0.3–1.2)
Total Protein: 7.9 g/dL (ref 6.5–8.1)

## 2021-09-29 LAB — PROTIME-INR
INR: 1 (ref 0.8–1.2)
Prothrombin Time: 13.4 seconds (ref 11.4–15.2)

## 2021-09-29 LAB — APTT: aPTT: 31 seconds (ref 24–36)

## 2021-09-29 LAB — GLUCOSE, CAPILLARY: Glucose-Capillary: 102 mg/dL — ABNORMAL HIGH (ref 70–99)

## 2021-09-29 NOTE — Pre-Procedure Instructions (Signed)
Surgical Instructions ? ? ? Your procedure is scheduled on Thursday 10/02/21. ? ? Report to [facility] Main Entrance "A" at 05:30 A.M., then check in with the Admitting office. ? Call this number if you have problems the morning of surgery: ? [phone] ? ? If you have any questions prior to your surgery date call [phone]: Open Monday-Friday 8am-4pm ? ? ? Remember: ? Do not eat or drink after midnight the night before your surgery ?  ? Take these medicines the morning of surgery with A SIP OF WATER:  ? amLODipine (NORVASC) ? cetirizine (ZYRTEC) ? finasteride (PROSCAR) ? ipratropium (ATROVENT) ? metoprolol tartrate (LOPRESSOR) ? triamcinolone (NASACORT) ? ? ? Take these medicines if needed:  ? acetaminophen (TYLENOL)  ? ?Please follow your surgeon's instructions regarding Aspirin.  ?If you have not received instructions then please contact your surgeon's office for instructions. ? ?As of today, STOP taking any Aleve, Naproxen, Ibuprofen, Motrin, Advil, Goody's, BC's, all herbal medications, fish oil, and all vitamins. ? ?WHAT DO I DO ABOUT MY DIABETES MEDICATION? ? ? ?Do not take oral diabetes medicines (pills) the morning of surgery. ? ?DO NOT TAKE  metFORMIN (GLUCOPHAGE) the morning of surgery.  ? ?The night before surgery take 12 units of insulin glargine (LANTUS). This is half of your normal dose.  ? ?The day of surgery, do not take other diabetes injectables, including Byetta (exenatide), Bydureon (exenatide ER), Victoza (liraglutide), or Trulicity (dulaglutide). DO NOT TAKE Semaglutide (Ozempic) the day of surgery ? ? ?HOW TO MANAGE YOUR DIABETES ?BEFORE AND AFTER SURGERY ? ?Why is it important to control my blood sugar before and after surgery? ?Improving blood sugar levels before and after surgery helps healing and can limit problems. ?A way of improving blood sugar control is eating a healthy diet by: ? Eating less sugar and carbohydrates ? Increasing activity/exercise ? Talking with your doctor about  reaching your blood sugar goals ?High blood sugars (greater than 180 mg/dL) can raise your risk of infections and slow your recovery, so you will need to focus on controlling your diabetes during the weeks before surgery. ?Make sure that the doctor who takes care of your diabetes knows about your planned surgery including the date and location. ? ?How do I manage my blood sugar before surgery? ?Check your blood sugar at least 4 times a day, starting 2 days before surgery, to make sure that the level is not too high or low. ? ?Check your blood sugar the morning of your surgery when you wake up and every 2 hours until you get to the Short Stay unit. ? ?If your blood sugar is less than 70 mg/dL, you will need to treat for low blood sugar: ?Do not take insulin. ?Treat a low blood sugar (less than 70 mg/dL) with ? cup of clear juice (cranberry or apple), 4 glucose tablets, OR glucose gel. ?Recheck blood sugar in 15 minutes after treatment (to make sure it is greater than 70 mg/dL). If your blood sugar is not greater than 70 mg/dL on recheck, call [phone] for further instructions. ?Report your blood sugar to the short stay nurse when you get to Short Stay. ? ?If you are admitted to the hospital after surgery: ?Your blood sugar will be checked by the staff and you will probably be given insulin after surgery (instead of oral diabetes medicines) to make sure you have good blood sugar levels. ?The goal for blood sugar control after surgery is 80-180 mg/dL.  ?         ?  Do not wear jewelry or makeup ?Do not wear lotions, powders, perfumes/colognes, or deodorant. ?Do not shave 48 hours prior to surgery.  Men may shave face and neck. ?Do not bring valuables to the hospital. ?Do not wear nail polish, gel polish, artificial nails, or any other type of covering on natural nails (fingers and toes) ?If you have artificial nails or gel coating that need to be removed by a nail salon, please have this removed prior to surgery.  Artificial nails or gel coating may interfere with anesthesia's ability to adequately monitor your vital signs. ? ?[facility] is not responsible for any belongings or valuables. .  ? ?Do NOT Smoke (Tobacco/Vaping)  24 hours prior to your procedure ? ?If you use a CPAP at night, you may bring your mask for your overnight stay. ?  ?Contacts, glasses, hearing aids, dentures or partials may not be worn into surgery, please bring cases for these belongings ?  ?For patients admitted to the hospital, discharge time will be determined by your treatment team. ?  ?Patients discharged the day of surgery will not be allowed to drive home, and someone needs to stay with them for 24 hours. ? ? ?SURGICAL WAITING ROOM VISITATION ?Patients having surgery or a procedure in a hospital may have two support people. ?Children under the age of 18 must have an adult with them who is not the patient. ?They may stay in the waiting area during the procedure and may switch out with other visitors. If the patient needs to stay at the hospital during part of their recovery, the visitor guidelines for inpatient rooms apply. ? ?Please refer to the [facility] website for the visitor guidelines for Inpatients (after your surgery is over and you are in a regular room).  ? ? ? ? ? ?Special instructions:   ? ?Oral Hygiene is also important to reduce your risk of infection.  Remember - BRUSH YOUR TEETH THE MORNING OF SURGERY WITH YOUR REGULAR TOOTHPASTE ? ? ?[facility]- Preparing For Surgery ? ?Before surgery, you can play an important role. Because skin is not sterile, your skin needs to be as free of germs as possible. You can reduce the number of germs on your skin by washing with CHG (chlorahexidine gluconate) Soap before surgery.  CHG is an antiseptic cleaner which kills germs and bonds with the skin to continue killing germs even after washing.   ? ? ?Please do not use if you have an allergy to CHG or antibacterial soaps. If your skin becomes  reddened/irritated stop using the CHG.  ?Do not shave (including legs and underarms) for at least 48 hours prior to first CHG shower. It is OK to shave your face. ? ?Please follow these instructions carefully. ?  ? ? Shower the NIGHT BEFORE SURGERY and the MORNING OF SURGERY with CHG Soap.  ? If you chose to wash your hair, wash your hair first as usual with your normal shampoo. After you shampoo, rinse your hair and body thoroughly to remove the shampoo.  Then [employer] and genitals (private parts) with your normal soap and rinse thoroughly to remove soap. ? ?After that Use CHG Soap as you would any other liquid soap. You can apply CHG directly to the skin and wash gently with a scrungie or a clean washcloth.  ? ?Apply the CHG Soap to your body ONLY FROM THE NECK DOWN.  Do not use on open wounds or open sores. Avoid contact with your eyes, ears, mouth and  genitals (private parts). Wash Face and genitals (private parts)  with your normal soap.  ? ?Wash thoroughly, paying special attention to the area where your surgery will be performed. ? ?Thoroughly rinse your body with warm water from the neck down. ? ?DO NOT shower/wash with your normal soap after using and rinsing off the CHG Soap. ? ?Pat yourself dry with a CLEAN TOWEL. ? ?Wear CLEAN PAJAMAS to bed the night before surgery ? ?Place CLEAN SHEETS on your bed the night before your surgery ? ?DO NOT SLEEP WITH PETS. ? ? ?Day of Surgery: ? ?Take a shower with CHG soap. ?Wear Clean/Comfortable clothing the morning of surgery ?Do not apply any deodorants/lotions.   ?Remember to brush your teeth WITH YOUR REGULAR TOOTHPASTE. ? ? ? ?If you received a COVID test during your pre-op visit, it is requested that you wear a mask when out in public, stay away from anyone that may not be feeling well, and notify your surgeon if you develop symptoms. If you have been in contact with anyone that has tested positive in the last 10 days, please notify your surgeon. ? ?  ?Please  read over the following fact sheets that you were given.  ? ?

## 2021-09-29 NOTE — Progress Notes (Addendum)
PCP - [name] (with [facility]), Shelly Coss [facility]) ?Cardiologist - [facility]- pt reports he received cardiac clearance from MD on 09/25/21- records requested ? ?PPM/ICD - denies ?Chest x-ray - denies ?EKG - 09/25/21 per patient- record requested- DOS if not received.  ?Stress Test - pt reports normal stress test a few years ago- records requested ?ECHO - 06/02/17 ?Cardiac Cath -pt unsure- pt reports he had AVR and Bypass surgery in [facility] at [facility] ? ?Sleep Study - pt denies sleep apnea ?Fasting Blood Sugar - CBG 102 at PAT today- pt reports he checks his CBG daily at home and it is usually around 99. Pt states he had a Hgb A1c drawn a few months ago and it was "around 7"- Hgb A1c drawn today at PAT ? ?Blood Thinner Instructions: Follow your surgeon's instructions on when to stop Aspirin.  If no instructions were given by your surgeon then you will need to call the office to get those instructions.   ? ?BP in PAT 168/81 with re-check 142/68- pt reports he has not taken his BP medications today, but his blood pressure at home this morning was 127/67. Pt brought med list to PAT which showed that he takes Metoprolol and NOT carvedilol.  ? ?ERAS Protcol -NPO orders ?COVID TEST- N/A ? ?Pt is Jehovah's Witness and  refuses blood products- pt brought list of acceptable blood products to PAT- form will need to be signed by MD and faxed to blood bank on DOS. Per Dr. Virl Cagey note, MD is aware that pt refuses blood products.  ? ? 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  ? ?Patient denies shortness of breath, fever, cough and chest pain at PAT appointment ? ? ?All instructions explained to the patient, with a verbal understanding of the material. Patient agrees to go over the instructions while at home for a better understanding. Patient also instructed to self quarantine after being tested for  COVID-19. The opportunity to ask questions was provided. ? ? ?

## 2021-09-30 LAB — NO BLOOD PRODUCTS

## 2021-09-30 NOTE — Anesthesia Preprocedure Evaluation (Deleted)
Anesthesia Evaluation  ? ? ?Airway ? ? ? ? ? ? ? Dental ?  ?Pulmonary ?former smoker,  ?  ? ? ? ? ? ? ? Cardiovascular ?hypertension,  ? ? ?  ?Neuro/Psych ?  ? GI/Hepatic ?  ?Endo/Other  ?diabetes ? Renal/GU ?  ? ?  ?Musculoskeletal ? ? Abdominal ?  ?Peds ? Hematology ?  ?Anesthesia Other Findings ? ? Reproductive/Obstetrics ? ?  ? ? ? ? ? ? ? ? ? ? ? ? ? ?  ?  ? ? ? ? ? ? ? ?                                  Anesthesia Evaluation  ?Patient identified by MRN, date of birth, ID band ?Patient awake ? ? ? ?Reviewed: ?Allergy & Precautions, NPO status , Patient's Chart, lab work & pertinent test results, reviewed documented beta blocker date and time  ? ?Airway ?Mallampati: I ? ?TM Distance: >3 FB ?Neck ROM: Full ? ? ? Dental ?no notable dental hx. ?(+) Edentulous Upper, Edentulous Lower ?  ?Pulmonary ?former smoker,  ?  ?Pulmonary exam normal ?breath sounds clear to auscultation ? ? ? ? ? ? Cardiovascular ?hypertension, Pt. on medications and Pt. on home beta blockers ?+ Past MI  ?Normal cardiovascular exam+ Valvular Problems/Murmurs AS and AI  ?Rhythm:Regular Rate:Normal ?+ Systolic murmurs ?TTE 06/2017 ?EF 55-60% ?Possibly bicuspid aortic valve that is severely thickened an ?calcified with severely restricted leaflet opening. Moderate aortic stenosis and insufficiency. ? ?MI 11/2017 no interventions ? ?Per pt, Stress in 2018 was negative ?  ?Neuro/Psych ?negative neurological ROS ? negative psych ROS  ? GI/Hepatic ?negative GI ROS, Neg liver ROS,   ?Endo/Other  ?negative endocrine ROSdiabetes, Poorly Controlled, Type 2, Insulin Dependent ? Renal/GU ?negative Renal ROS  ?negative genitourinary ?  ?Musculoskeletal ? ?(+) Arthritis ,  ? Abdominal ?  ?Peds ? Hematology ?negative hematology ROS ?(+)   ?Anesthesia Other Findings ? ? Reproductive/Obstetrics ? ?  ? ? ? ? ? ? ? ? ? ? ? ? ? ?  ?  ? ? ? ? ? ? ?Anesthesia Physical ?Anesthesia Plan ? ?ASA: III ? ?Anesthesia Plan: General   ? ?Post-op Pain Management:   ? ?Induction: Intravenous ? ?PONV Risk Score and Plan: 2 and Dexamethasone and Ondansetron ? ?Airway Management Planned: LMA ? ?Additional Equipment:  ? ?Intra-op Plan:  ? ?Post-operative Plan: Extubation in OR ? ?Informed Consent: I have reviewed the patients History and Physical, chart, labs and discussed the procedure including the risks, benefits and alternatives for the proposed anesthesia with the patient or authorized representative who has indicated his/her understanding and acceptance.  ? ?Dental advisory given ? ?Plan Discussed with: CRNA ? ?Anesthesia Plan Comments:   ? ? ? ? ? ? ?Anesthesia Quick Evaluation ? ?Anesthesia Physical ?Anesthesia Plan ? ?ASA:  ? ?Anesthesia Plan:   ? ?Post-op Pain Management:   ? ?Induction:  ? ?PONV Risk Score and Plan:  ? ?Airway Management Planned:  ? ?Additional Equipment:  ? ?Intra-op Plan:  ? ?Post-operative Plan:  ? ?Informed Consent:  ? ?Plan Discussed with:  ? ?Anesthesia Plan Comments: (See APP note by Durel Salts, FNP )  ? ? ? ? ? ? ?Anesthesia Quick Evaluation ? ?

## 2021-09-30 NOTE — Progress Notes (Signed)
Anesthesia Chart Review: ? ? Case: [id number] Date/Time: 10/02/21 0715  ? Procedures:  ?    RIGHT COMMON FEMORAL ENDARTERECTOMY (Right) ?    INSERTION OF RIGHT ILIAC STENT (Right)  ? Anesthesia type: General  ? Pre-op diagnosis: Critical limb ischemia of both lower extremities  ? Location: [facility] ROOM 16 / [facility] OR  ? Surgeons: [name], MD  ? ?  ? ? ?DISCUSSION: ?Pt is 76 years old with hx CAD (s/p CABG 2020: SVG-OM1, LIMA-LAD), aortic stenosis (s/p bioprosthetic AVR 2020), s/p LAA exclusion with Atriclip, HTN, DM ? ?Pt is Jehovah's Witness and  refuses blood products- pt brought list of acceptable blood products to PAT- form will need to be signed by MD and faxed to blood bank on DOS. Per Dr. Virl Cagey note, MD is aware that pt refuses blood products.  ? ? ?VS: BP (!) 142/68   Pulse 73   Temp 36.5 ?C (Oral)   Resp 18   Ht '5\' 9"'$  (1.753 m)   Wt 83.6 kg   SpO2 100%   BMI 27.23 kg/m?  ? ?PROVIDERS: ?- PCP is [name], MD ?- Cardiologist is Conrad Almira at [facility] who is aware of upcoming surgery (see note in care everywhere 09/24/21) ? ?LABS: Labs reviewed: Acceptable for surgery. ?(all labs ordered are listed, but only abnormal results are displayed) ? ?Labs Reviewed  ?GLUCOSE, CAPILLARY - Abnormal; Notable for the following components:  ?    Result Value  ? Glucose-Capillary 102 (*)   ? All other components within normal limits  ?COMPREHENSIVE METABOLIC PANEL - Abnormal; Notable for the following components:  ? Glucose, Bld 104 (*)   ? All other components within normal limits  ?URINALYSIS, ROUTINE W REFLEX MICROSCOPIC - Abnormal; Notable for the following components:  ? Protein, ur 30 (*)   ? All other components within normal limits  ?HEMOGLOBIN A1C - Abnormal; Notable for the following components:  ? Hgb A1c MFr Bld 7.6 (*)   ? All other components within normal limits  ?SURGICAL PCR SCREEN  ?CBC  ?PROTIME-INR  ?APTT  ? ? ? ?EKG [facility]/24/[facility] (at [facility] Richardon's office): tracing requested ?By notes,  EKG showed: nsr w borderline septal q's; no st changes; normal intervals ?- Will obtain EKG day of surgery if requested tracing does not arrive in time ? ? ? ?CV: ?Echo 04/2019 (from Dr. Nena Alexander note 09/24/21 in care everywhere):  ?- Mild left ventricular hypertrophy ?- Normal left ventricular size and function, EF 60-65% ?- Grade II diastolic dysfunction ?- Right ventricle appears borderline dilated. TAPSE=1.1cm suggests reduced systolic function, but visually systolic function appears preserved 6m Carpentier-Edwards Perimount Magna Ease pericardial bioprosthetic valve is well-seated in the aortic position with no significant regurgitation or stenosis; peak velocity 1.5 m/s. ?- Mild mitral regurgitation ?- Normal caliber IVC suggests normal right atrial pressure. ?- Estimated PASP 332mg, upper normal ?- No pericardial effusion ?- In comparison with the images of 07/08/2018 the aortic valve has been replaced. ? ? ?Past Medical History:  ?Diagnosis Date  ? Aortic valve stenosis, moderate   ? per echo 06-02-2017 (in epic)  ef 55-60%,  possible bicuspid AV,  severeely thickened and calcified with severely restricted leaflet opening,  valve area 1.17cm^2, mean gradient 3072m, peak gradient 64m69m  moderate AR  ? Arthritis   ? oa  ? BPH associated with nocturia   ? [facility]  ? Complication of anesthesia   ? due to heart murmur  mod to severe aortic stenosis  ? Coronary artery disease   ? Full dentures   ? Glaucoma, both eyes   ? Heart murmur, systolic   ? Hiatal hernia   ? History of kidney stones   ? Hypertension   ? Left ureteral stone   ? Myocardial infarction [facility])   ? mild in 11-2017 at [facility] F/U from [place] on chart  ? Peripheral vascular disease ([place])   ? SIRS (systemic inflammatory response syndrome) ([place]) 05/31/2017  ? hospital admission--  unknown etiology  ? Type 2 diabetes mellitus ([place])   ? Wears glasses   ? ? ?Past Surgical History:  ?Procedure Laterality Date  ? COLONOSCOPY     ? CORONARY ARTERY BYPASS GRAFT    ? pt reports Aortic valve replacement and CABG in 2020  ? CYSTOSCOPY WITH RETROGRADE PYELOGRAM, URETEROSCOPY AND STENT PLACEMENT Left 02/04/2018  ? Procedure: CYSTOSCOPY WITH RETROGRADE PYELOGRAM, URETEROSCOPY AND STENT [place] OF STONE;  Surgeon: Alexis Frock, MD;  Location: [facility] ORS;  Service: Urology;  Laterality: Left;  ? EXAM UNDER ANESTHESIA WITH MANIPULATION OF KNEE Left 09/30/2017  ? Procedure: EXAM UNDER ANESTHESIA WITH MANIPULATION OF KNEE;  Surgeon: [name], MD;  Location: [facility] ORS;  Service: Orthopedics;  Laterality: Left;  30 mins  ? PROSTATE BIOPSY  07/2017  ? cancerous polyps x2  watching psa dr Tresa Moore at [facility]  ? TOTAL KNEE ARTHROPLASTY Left 05/28/2017  ? Procedure: LEFT TOTAL KNEE ARTHROPLASTY;  Surgeon: [name], MD;  Location: [facility] ORS;  Service: Orthopedics;  Laterality: Left;  ? TRANSTHORACIC ECHOCARDIOGRAM  06/02/2017  ? ef 55-60%/  possible AV bicuspid,  severely thickened and calcified leaflets with severely restricted leaflet opening, moderate regurg.(valve area 1.17cm^2,  mean gradient 33mHg, peak gradient 562mg)/  mild LAE/  mild TR/  mild MV thickened leaflets and regurg. (not restricted and no stenosis  ? ? ?MEDICATIONS: ? potassium chloride SA (KLOR-CON M) 20 MEQ tablet  ? acetaminophen (TYLENOL) 500 MG tablet  ? amLODipine (NORVASC) 10 MG tablet  ? aspirin 81 MG chewable tablet  ? atorvastatin (LIPITOR) 80 MG tablet  ? Brinzolamide-Brimonidine 1-0.2 % SUSP  ? carvedilol (COREG) 25 MG tablet  ? cephALEXin (KEFLEX) 500 MG capsule  ? cetirizine (ZYRTEC) 10 MG tablet  ? cholecalciferol (VITAMIN D3) 25 MCG (1000 UNIT) tablet  ? finasteride (PROSCAR) 5 MG tablet  ? furosemide (LASIX) 40 MG tablet  ? insulin glargine (LANTUS) 100 UNIT/ML injection  ? ipratropium (ATROVENT) 0.06 % nasal spray  ? latanoprost (XALATAN) 0.005 % ophthalmic solution  ? lisinopril (ZESTRIL) 10 MG tablet  ? metFORMIN (GLUCOPHAGE) 500 MG tablet  ?  metoprolol tartrate (LOPRESSOR) 50 MG tablet  ? Semaglutide,0.25 or 0.'5MG'$ /DOS, 2 MG/1.5ML SOPN  ? tamsulosin (FLOMAX) 0.4 MG CAPS capsule  ? traMADol (ULTRAM) 50 MG tablet  ? triamcinolone (NASACORT) 55 MCG/ACT AERO nasal inhaler  ? ?No current facility-administered medications for this encounter.  ? ? ?If no changes, I anticipate pt can proceed with surgery as scheduled.  ? ?AnWilleen CassPhD, FNP-BC ?[facility]est[facility]iology ?Phone: [phone]/07/2021 9:30 AM ? ? ? ? ? ? ? ?

## 2021-10-01 NOTE — Anesthesia Preprocedure Evaluation (Addendum)
Anesthesia Evaluation  ?Patient identified by MRN, date of birth, ID band ?Patient awake ? ? ? ?Reviewed: ?Allergy & Precautions, NPO status , Patient's Chart, lab work & pertinent test results ? ?History of Anesthesia Complications ?Negative for: history of anesthetic complications ? ?Airway ?Mallampati: I ? ?TM Distance: >3 FB ?Neck ROM: Full ? ? ? Dental ? ?(+) Edentulous Upper, Edentulous Lower, Dental Advisory Given ?  ?Pulmonary ?former smoker,  ?  ?Pulmonary exam normal ? ? ? ? ? ? ? Cardiovascular ?hypertension, Pt. on medications and Pt. on home beta blockers ?+ Past MI and + CABG  ?+ Valvular Problems/Murmurs  ?Rhythm:Regular Rate:Normal ?+ Systolic murmurs ? ?Echo 04/2019 (from Dr. Nena Alexander note 09/24/21 in care everywhere):  ?- Mild left ventricular hypertrophy ?- Normal left ventricular size and function, EF 60-65% ?- Grade II diastolic dysfunction ?- Right ventricle appears borderline dilated. TAPSE=1.1cm suggests reduced systolic function, but visually systolic function appears preserved 19m Carpentier-Edwards Perimount Magna Ease pericardial bioprosthetic valve is well-seated in the aortic position with no significant regurgitation or stenosis; peak velocity 1.5 m/s. ?- Mild mitral regurgitation ?- Normal caliber IVC suggests normal right atrial pressure. ?- Estimated PASP 322mg, upper normal ?- No pericardial effusion ?- In comparison with the images of 07/08/2018 the aortic valve has been replaced. ?TTE 06/2017 ?EF 55-60% ?Possibly bicuspid aortic valve that is severely thickened an ?calcified with severely restricted leaflet opening. Moderate aortic stenosis and insufficiency. ? ?MI 11/2017 no interventions ? ?Per pt, Stress in 2018 was negative ?  ?Neuro/Psych ?negative neurological ROS ? negative psych ROS  ? GI/Hepatic ?negative GI ROS, Neg liver ROS,   ?Endo/Other  ?negative endocrine ROSdiabetes, Poorly Controlled, Type 2, Insulin Dependent ?  Renal/GU ?negative Renal ROS  ?negative genitourinary ?  ?Musculoskeletal ? ?(+) Arthritis ,  ? Abdominal ?  ?Peds ? Hematology ?negative hematology ROS ?(+)   ?Anesthesia Other Findings ? ? Reproductive/Obstetrics ? ?  ? ? ? ? ? ? ? ? ? ? ? ? ? ?  ?  ? ? ? ? ? ?Anesthesia Physical ? ?Anesthesia Plan ? ?ASA: 3 ? ?Anesthesia Plan: General  ? ?Post-op Pain Management:   ? ?Induction: Intravenous ? ?PONV Risk Score and Plan: 2 and 3 and Dexamethasone, Ondansetron and Diphenhydramine ? ?Airway Management Planned: Oral ETT ? ?Additional Equipment:  ? ?Intra-op Plan:  ? ?Post-operative Plan: Extubation in OR ? ?Informed Consent: I have reviewed the patients History and Physical, chart, labs and discussed the procedure including the risks, benefits and alternatives for the proposed anesthesia with the patient or authorized representative who has indicated his/her understanding and acceptance.  ? ? ? ?Dental advisory given ? ?Plan Discussed with: Anesthesiologist and CRNA ? ?Anesthesia Plan Comments:   ? ? ? ? ? ?Anesthesia Quick Evaluation ? ?

## 2021-10-02 ENCOUNTER — Encounter (HOSPITAL_COMMUNITY)
Admission: RE | Disposition: A | Discharge status: Home / Self Care | Payer: Self-pay | Source: Home / Self Care | Attending: Vascular Surgery

## 2021-10-02 ENCOUNTER — Other Ambulatory Visit: Payer: Self-pay

## 2021-10-02 ENCOUNTER — Inpatient Hospital Stay (HOSPITAL_COMMUNITY)
Admission: RE | Admit: 2021-10-02 | Discharge: 2021-10-04 | DRG: 254 | Disposition: A | Discharge status: Home / Self Care | Payer: Medicare HMO | Attending: Vascular Surgery | Admitting: Vascular Surgery

## 2021-10-02 ENCOUNTER — Inpatient Hospital Stay (HOSPITAL_COMMUNITY): Payer: Medicare HMO

## 2021-10-02 ENCOUNTER — Encounter (HOSPITAL_COMMUNITY): Payer: Self-pay | Admitting: Vascular Surgery

## 2021-10-02 ENCOUNTER — Inpatient Hospital Stay (HOSPITAL_COMMUNITY): Payer: Medicare HMO | Admitting: Anesthesiology

## 2021-10-02 ENCOUNTER — Inpatient Hospital Stay (HOSPITAL_COMMUNITY): Payer: Medicare HMO | Admitting: Emergency Medicine

## 2021-10-02 DIAGNOSIS — Z951 Presence of aortocoronary bypass graft: Secondary | ICD-10-CM | POA: Diagnosis not present

## 2021-10-02 DIAGNOSIS — E1151 Type 2 diabetes mellitus with diabetic peripheral angiopathy without gangrene: Principal | ICD-10-CM | POA: Diagnosis present

## 2021-10-02 DIAGNOSIS — I350 Nonrheumatic aortic (valve) stenosis: Secondary | ICD-10-CM | POA: Diagnosis not present

## 2021-10-02 DIAGNOSIS — I2510 Atherosclerotic heart disease of native coronary artery without angina pectoris: Secondary | ICD-10-CM | POA: Diagnosis present

## 2021-10-02 DIAGNOSIS — Z794 Long term (current) use of insulin: Secondary | ICD-10-CM

## 2021-10-02 DIAGNOSIS — R351 Nocturia: Secondary | ICD-10-CM | POA: Diagnosis not present

## 2021-10-02 DIAGNOSIS — Z531 Procedure and treatment not carried out because of patient's decision for reasons of belief and group pressure: Secondary | ICD-10-CM | POA: Diagnosis present

## 2021-10-02 DIAGNOSIS — Z8546 Personal history of malignant neoplasm of prostate: Secondary | ICD-10-CM

## 2021-10-02 DIAGNOSIS — I252 Old myocardial infarction: Secondary | ICD-10-CM | POA: Diagnosis not present

## 2021-10-02 DIAGNOSIS — I10 Essential (primary) hypertension: Secondary | ICD-10-CM

## 2021-10-02 DIAGNOSIS — I70221 Atherosclerosis of native arteries of extremities with rest pain, right leg: Secondary | ICD-10-CM | POA: Diagnosis present

## 2021-10-02 DIAGNOSIS — E1165 Type 2 diabetes mellitus with hyperglycemia: Secondary | ICD-10-CM

## 2021-10-02 DIAGNOSIS — Z953 Presence of xenogenic heart valve: Secondary | ICD-10-CM

## 2021-10-02 DIAGNOSIS — Z96652 Presence of left artificial knee joint: Secondary | ICD-10-CM | POA: Diagnosis present

## 2021-10-02 DIAGNOSIS — N401 Enlarged prostate with lower urinary tract symptoms: Secondary | ICD-10-CM | POA: Diagnosis present

## 2021-10-02 DIAGNOSIS — Z7982 Long term (current) use of aspirin: Secondary | ICD-10-CM | POA: Diagnosis not present

## 2021-10-02 DIAGNOSIS — Z87891 Personal history of nicotine dependence: Secondary | ICD-10-CM | POA: Diagnosis not present

## 2021-10-02 DIAGNOSIS — I998 Other disorder of circulatory system: Secondary | ICD-10-CM | POA: Diagnosis not present

## 2021-10-02 DIAGNOSIS — I739 Peripheral vascular disease, unspecified: Principal | ICD-10-CM | POA: Diagnosis present

## 2021-10-02 HISTORY — PX: INSERTION OF ILIAC STENT: SHX6256

## 2021-10-02 HISTORY — PX: PATCH ANGIOPLASTY: SHX6230

## 2021-10-02 HISTORY — PX: ENDARTERECTOMY FEMORAL: SHX5804

## 2021-10-02 LAB — CBC
HCT: 36.8 % — ABNORMAL LOW (ref 39.0–52.0)
Hemoglobin: 11.9 g/dL — ABNORMAL LOW (ref 13.0–17.0)
MCH: 29.1 pg (ref 26.0–34.0)
MCHC: 32.3 g/dL (ref 30.0–36.0)
MCV: 90 fL (ref 80.0–100.0)
Platelets: 172 10*3/uL (ref 150–400)
RBC: 4.09 MIL/uL — ABNORMAL LOW (ref 4.22–5.81)
RDW: 13.2 % (ref 11.5–15.5)
WBC: 6.9 10*3/uL (ref 4.0–10.5)
nRBC: 0 % (ref 0.0–0.2)

## 2021-10-02 LAB — GLUCOSE, CAPILLARY
Glucose-Capillary: 127 mg/dL — ABNORMAL HIGH (ref 70–99)
Glucose-Capillary: 110 mg/dL — ABNORMAL HIGH (ref 70–99)
Glucose-Capillary: 116 mg/dL — ABNORMAL HIGH (ref 70–99)
Glucose-Capillary: 116 mg/dL — ABNORMAL HIGH (ref 70–99)
Glucose-Capillary: 108 mg/dL — ABNORMAL HIGH (ref 70–99)
Glucose-Capillary: 146 mg/dL — ABNORMAL HIGH (ref 70–99)

## 2021-10-02 LAB — CREATININE, SERUM
Creatinine, Ser: 1.07 mg/dL (ref 0.61–1.24)
GFR, Estimated: >60 mL/min (ref >60)

## 2021-10-02 LAB — POCT ACTIVATED CLOTTING TIME
Activated Clotting Time: 275 seconds
Activated Clotting Time: 227 seconds
Activated Clotting Time: 245 seconds

## 2021-10-02 SURGERY — ENDARTERECTOMY, FEMORAL
Anesthesia: General | Site: Groin | Laterality: Right

## 2021-10-02 MED ORDER — ACETAMINOPHEN 650 MG RE SUPP: 325.0000 mg | RECTAL | Status: DC | PRN

## 2021-10-02 MED ORDER — IODIXANOL 320 MG/ML IV SOLN
INTRAVENOUS | Status: DC | PRN
  Administered 2021-10-02: 100 mL

## 2021-10-02 MED ORDER — HEPARIN 6000 UNIT IRRIGATION SOLUTION
Status: AC
  Filled 2021-10-02: qty 500

## 2021-10-02 MED ORDER — SODIUM CHLORIDE 0.9 % IV SOLN: INTRAVENOUS | Status: DC

## 2021-10-02 MED ORDER — ONDANSETRON HCL 4 MG/2ML IJ SOLN: 4.0000 mg | Freq: Four times a day (QID) | INTRAMUSCULAR | Status: DC | PRN

## 2021-10-02 MED ORDER — MIDAZOLAM HCL 5 MG/5ML IJ SOLN
INTRAMUSCULAR | Status: DC | PRN
  Administered 2021-10-02: 2 mg via INTRAVENOUS

## 2021-10-02 MED ORDER — LORATADINE 10 MG PO TABS
10.0000 mg | ORAL_TABLET | Freq: Every day | ORAL | Status: DC
  Administered 2021-10-03 – 2021-10-04 (×2): 10 mg via ORAL
  Filled 2021-10-02 (×2): qty 1

## 2021-10-02 MED ORDER — SUGAMMADEX SODIUM 200 MG/2ML IV SOLN
INTRAVENOUS | Status: DC | PRN
  Administered 2021-10-02: 400 mg via INTRAVENOUS

## 2021-10-02 MED ORDER — CHLORHEXIDINE GLUCONATE CLOTH 2 % EX PADS: 6.0000 | MEDICATED_PAD | Freq: Once | CUTANEOUS | Status: DC

## 2021-10-02 MED ORDER — PANTOPRAZOLE SODIUM 40 MG PO TBEC
40.0000 mg | DELAYED_RELEASE_TABLET | Freq: Every day | ORAL | Status: DC
  Administered 2021-10-02 – 2021-10-04 (×3): 40 mg via ORAL
  Filled 2021-10-02 (×3): qty 1

## 2021-10-02 MED ORDER — LIDOCAINE 2% (20 MG/ML) 5 ML SYRINGE
INTRAMUSCULAR | Status: DC | PRN
  Administered 2021-10-02: 100 mg via INTRAVENOUS

## 2021-10-02 MED ORDER — GUAIFENESIN-DM 100-10 MG/5ML PO SYRP: 15.0000 mL | ORAL_SOLUTION | ORAL | Status: DC | PRN

## 2021-10-02 MED ORDER — ALBUMIN HUMAN 5 % IV SOLN
INTRAVENOUS | Status: DC | PRN
Start: 2021-10-02 — End: 2021-10-02

## 2021-10-02 MED ORDER — INSULIN ASPART 100 UNIT/ML IJ SOLN
0.0000 [IU] | Freq: Three times a day (TID) | INTRAMUSCULAR | Status: DC
  Administered 2021-10-03: 2 [IU] via SUBCUTANEOUS
  Administered 2021-10-03: 3 [IU] via SUBCUTANEOUS
  Administered 2021-10-03 – 2021-10-04 (×2): 2 [IU] via SUBCUTANEOUS

## 2021-10-02 MED ORDER — CHLORHEXIDINE GLUCONATE 0.12 % MT SOLN
OROMUCOSAL | Status: AC
  Administered 2021-10-02: 15 mL via OROMUCOSAL
  Filled 2021-10-02: qty 15

## 2021-10-02 MED ORDER — HYDROMORPHONE HCL 1 MG/ML IJ SOLN: 0.5000 mg | INTRAMUSCULAR | Status: DC | PRN

## 2021-10-02 MED ORDER — PROPOFOL 10 MG/ML IV BOLUS
INTRAVENOUS | Status: AC
  Filled 2021-10-02: qty 20

## 2021-10-02 MED ORDER — HEMOSTATIC AGENTS (NO CHARGE) OPTIME
TOPICAL | Status: DC | PRN
  Administered 2021-10-02: 2 via TOPICAL

## 2021-10-02 MED ORDER — METOPROLOL TARTRATE 5 MG/5ML IV SOLN: 2.0000 mg | INTRAVENOUS | Status: DC | PRN

## 2021-10-02 MED ORDER — ROCURONIUM BROMIDE 10 MG/ML (PF) SYRINGE
PREFILLED_SYRINGE | INTRAVENOUS | Status: AC
  Filled 2021-10-02: qty 10

## 2021-10-02 MED ORDER — MAGNESIUM SULFATE 2 GM/50ML IV SOLN: 2.0000 g | Freq: Every day | INTRAVENOUS | Status: DC | PRN

## 2021-10-02 MED ORDER — CLOPIDOGREL BISULFATE 75 MG PO TABS
75.0000 mg | ORAL_TABLET | Freq: Every day | ORAL | Status: DC
  Administered 2021-10-03 – 2021-10-04 (×2): 75 mg via ORAL
  Filled 2021-10-02 (×2): qty 1

## 2021-10-02 MED ORDER — PHENOL 1.4 % MT LIQD: 1.0000 | OROMUCOSAL | Status: DC | PRN

## 2021-10-02 MED ORDER — MIDAZOLAM HCL 2 MG/2ML IJ SOLN
INTRAMUSCULAR | Status: AC
  Filled 2021-10-02: qty 2

## 2021-10-02 MED ORDER — LISINOPRIL 10 MG PO TABS
10.0000 mg | ORAL_TABLET | Freq: Every day | ORAL | Status: DC
Start: 2021-10-02 — End: 2021-10-04
  Administered 2021-10-02 – 2021-10-04 (×3): 10 mg via ORAL
  Filled 2021-10-02 (×3): qty 1

## 2021-10-02 MED ORDER — FENTANYL CITRATE (PF) 100 MCG/2ML IJ SOLN: 25.0000 ug | INTRAMUSCULAR | Status: DC | PRN

## 2021-10-02 MED ORDER — ROCURONIUM BROMIDE 10 MG/ML (PF) SYRINGE
PREFILLED_SYRINGE | INTRAVENOUS | Status: DC | PRN
Start: 2021-10-02 — End: 2021-10-02
  Administered 2021-10-02: 20 mg via INTRAVENOUS
  Administered 2021-10-02: 30 mg via INTRAVENOUS
  Administered 2021-10-02: 70 mg via INTRAVENOUS
  Administered 2021-10-02 (×2): 40 mg via INTRAVENOUS

## 2021-10-02 MED ORDER — INSULIN ASPART 100 UNIT/ML IJ SOLN: 0.0000 [IU] | INTRAMUSCULAR | Status: DC | PRN

## 2021-10-02 MED ORDER — SODIUM CHLORIDE 0.9 % IV SOLN: 500.0000 mL | Freq: Once | INTRAVENOUS | Status: DC | PRN

## 2021-10-02 MED ORDER — LIDOCAINE 2% (20 MG/ML) 5 ML SYRINGE
INTRAMUSCULAR | Status: AC
  Filled 2021-10-02: qty 5

## 2021-10-02 MED ORDER — ALUM & MAG HYDROXIDE-SIMETH 200-200-20 MG/5ML PO SUSP: 15.0000 mL | ORAL | Status: DC | PRN

## 2021-10-02 MED ORDER — CEFAZOLIN SODIUM-DEXTROSE 2-4 GM/100ML-% IV SOLN
INTRAVENOUS | Status: AC
  Filled 2021-10-02: qty 100

## 2021-10-02 MED ORDER — 0.9 % SODIUM CHLORIDE (POUR BTL) OPTIME
TOPICAL | Status: DC | PRN
  Administered 2021-10-02: 2000 mL

## 2021-10-02 MED ORDER — ATORVASTATIN CALCIUM 80 MG PO TABS
80.0000 mg | ORAL_TABLET | Freq: Every day | ORAL | Status: DC
Start: 2021-10-02 — End: 2021-10-04
  Administered 2021-10-02 – 2021-10-03 (×2): 80 mg via ORAL
  Filled 2021-10-02 (×2): qty 1

## 2021-10-02 MED ORDER — CEFAZOLIN SODIUM-DEXTROSE 2-4 GM/100ML-% IV SOLN
2.0000 g | Freq: Three times a day (TID) | INTRAVENOUS | Status: AC
  Administered 2021-10-02 (×2): 2 g via INTRAVENOUS
  Filled 2021-10-02 (×2): qty 100

## 2021-10-02 MED ORDER — PHENYLEPHRINE 80 MCG/ML (10ML) SYRINGE FOR IV PUSH (FOR BLOOD PRESSURE SUPPORT)
PREFILLED_SYRINGE | INTRAVENOUS | Status: DC | PRN
Start: 2021-10-02 — End: 2021-10-02
  Administered 2021-10-02: 160 ug via INTRAVENOUS
  Administered 2021-10-02: 240 ug via INTRAVENOUS
  Administered 2021-10-02 (×3): 160 ug via INTRAVENOUS
  Administered 2021-10-02: 240 ug via INTRAVENOUS
  Administered 2021-10-02 (×3): 160 ug via INTRAVENOUS
  Administered 2021-10-02: 80 ug via INTRAVENOUS
  Administered 2021-10-02 (×2): 160 ug via INTRAVENOUS

## 2021-10-02 MED ORDER — LABETALOL HCL 5 MG/ML IV SOLN: 10.0000 mg | INTRAVENOUS | Status: DC | PRN

## 2021-10-02 MED ORDER — CEFAZOLIN SODIUM-DEXTROSE 2-4 GM/100ML-% IV SOLN
2.0000 g | INTRAVENOUS | Status: AC
  Administered 2021-10-02: 2 g via INTRAVENOUS

## 2021-10-02 MED ORDER — ORAL CARE MOUTH RINSE: 15.0000 mL | Freq: Once | OROMUCOSAL | Status: AC

## 2021-10-02 MED ORDER — HEPARIN SODIUM (PORCINE) 1000 UNIT/ML IJ SOLN
INTRAMUSCULAR | Status: AC
  Filled 2021-10-02: qty 20

## 2021-10-02 MED ORDER — FINASTERIDE 5 MG PO TABS
5.0000 mg | ORAL_TABLET | Freq: Every morning | ORAL | Status: DC
  Administered 2021-10-02 – 2021-10-04 (×3): 5 mg via ORAL
  Filled 2021-10-02 (×3): qty 1

## 2021-10-02 MED ORDER — FENTANYL CITRATE (PF) 250 MCG/5ML IJ SOLN
INTRAMUSCULAR | Status: DC | PRN
Start: 2021-10-02 — End: 2021-10-02
  Administered 2021-10-02: 50 ug via INTRAVENOUS
  Administered 2021-10-02 (×2): 75 ug via INTRAVENOUS
  Administered 2021-10-02 (×2): 25 ug via INTRAVENOUS

## 2021-10-02 MED ORDER — SURGIFLO WITH THROMBIN (HEMOSTATIC MATRIX KIT) OPTIME
TOPICAL | Status: DC | PRN
  Administered 2021-10-02: 1 via TOPICAL

## 2021-10-02 MED ORDER — SENNOSIDES-DOCUSATE SODIUM 8.6-50 MG PO TABS: 1.0000 | ORAL_TABLET | Freq: Every evening | ORAL | Status: DC | PRN

## 2021-10-02 MED ORDER — ASPIRIN 81 MG PO CHEW
81.0000 mg | CHEWABLE_TABLET | Freq: Every day | ORAL | Status: DC
Start: 2021-10-02 — End: 2021-10-04
  Administered 2021-10-02 – 2021-10-04 (×3): 81 mg via ORAL
  Filled 2021-10-02 (×3): qty 1

## 2021-10-02 MED ORDER — OXYCODONE-ACETAMINOPHEN 5-325 MG PO TABS: 1.0000 | ORAL_TABLET | ORAL | Status: DC | PRN

## 2021-10-02 MED ORDER — ONDANSETRON HCL 4 MG/2ML IJ SOLN
INTRAMUSCULAR | Status: DC | PRN
  Administered 2021-10-02: 4 mg via INTRAVENOUS

## 2021-10-02 MED ORDER — ACETAMINOPHEN 500 MG PO TABS: 1000.0000 mg | ORAL_TABLET | Freq: Once | ORAL | Status: AC

## 2021-10-02 MED ORDER — HEPARIN SODIUM (PORCINE) 5000 UNIT/ML IJ SOLN
5000.0000 [IU] | Freq: Three times a day (TID) | INTRAMUSCULAR | Status: DC
  Administered 2021-10-03 – 2021-10-04 (×4): 5000 [IU] via SUBCUTANEOUS
  Filled 2021-10-02 (×4): qty 1

## 2021-10-02 MED ORDER — AMISULPRIDE (ANTIEMETIC) 5 MG/2ML IV SOLN: 10.0000 mg | Freq: Once | INTRAVENOUS | Status: DC | PRN

## 2021-10-02 MED ORDER — DOCUSATE SODIUM 100 MG PO CAPS
100.0000 mg | ORAL_CAPSULE | Freq: Every day | ORAL | Status: DC
  Administered 2021-10-03 – 2021-10-04 (×2): 100 mg via ORAL
  Filled 2021-10-02 (×2): qty 1

## 2021-10-02 MED ORDER — FENTANYL CITRATE (PF) 250 MCG/5ML IJ SOLN
INTRAMUSCULAR | Status: AC
  Filled 2021-10-02: qty 5

## 2021-10-02 MED ORDER — PHENYLEPHRINE 80 MCG/ML (10ML) SYRINGE FOR IV PUSH (FOR BLOOD PRESSURE SUPPORT)
PREFILLED_SYRINGE | INTRAVENOUS | Status: AC
  Filled 2021-10-02: qty 30

## 2021-10-02 MED ORDER — TAMSULOSIN HCL 0.4 MG PO CAPS
0.4000 mg | ORAL_CAPSULE | Freq: Every day | ORAL | Status: DC
  Administered 2021-10-02 – 2021-10-03 (×2): 0.4 mg via ORAL
  Filled 2021-10-02 (×2): qty 1

## 2021-10-02 MED ORDER — PROPOFOL 10 MG/ML IV BOLUS
INTRAVENOUS | Status: DC | PRN
  Administered 2021-10-02: 130 mg via INTRAVENOUS
  Administered 2021-10-02 (×2): 30 mg via INTRAVENOUS

## 2021-10-02 MED ORDER — CHLORHEXIDINE GLUCONATE 0.12 % MT SOLN: 15.0000 mL | Freq: Once | OROMUCOSAL | Status: AC

## 2021-10-02 MED ORDER — PHENYLEPHRINE HCL-NACL 20-0.9 MG/250ML-% IV SOLN
INTRAVENOUS | Status: DC | PRN
  Administered 2021-10-02: 20 ug/min via INTRAVENOUS

## 2021-10-02 MED ORDER — ONDANSETRON HCL 4 MG/2ML IJ SOLN
INTRAMUSCULAR | Status: AC
  Filled 2021-10-02: qty 2

## 2021-10-02 MED ORDER — BISACODYL 5 MG PO TBEC: 5.0000 mg | DELAYED_RELEASE_TABLET | Freq: Every day | ORAL | Status: DC | PRN

## 2021-10-02 MED ORDER — ACETAMINOPHEN 500 MG PO TABS
ORAL_TABLET | ORAL | Status: AC
  Administered 2021-10-02: 1000 mg via ORAL
  Filled 2021-10-02: qty 2

## 2021-10-02 MED ORDER — FUROSEMIDE 40 MG PO TABS
40.0000 mg | ORAL_TABLET | Freq: Every morning | ORAL | Status: DC
  Administered 2021-10-02 – 2021-10-04 (×3): 40 mg via ORAL
  Filled 2021-10-02 (×3): qty 1

## 2021-10-02 MED ORDER — HEPARIN SODIUM (PORCINE) 1000 UNIT/ML IJ SOLN
INTRAMUSCULAR | Status: DC | PRN
  Administered 2021-10-02: 2000 [IU] via INTRAVENOUS
  Administered 2021-10-02: 8000 [IU] via INTRAVENOUS
  Administered 2021-10-02: 1000 [IU] via INTRAVENOUS

## 2021-10-02 MED ORDER — AMLODIPINE BESYLATE 10 MG PO TABS
10.0000 mg | ORAL_TABLET | Freq: Every morning | ORAL | Status: DC
  Administered 2021-10-03 – 2021-10-04 (×2): 10 mg via ORAL
  Filled 2021-10-02 (×2): qty 1

## 2021-10-02 MED ORDER — LACTATED RINGERS IV SOLN: INTRAVENOUS | Status: DC

## 2021-10-02 MED ORDER — GLYCOPYRROLATE 0.2 MG/ML IJ SOLN
INTRAMUSCULAR | Status: DC | PRN
  Administered 2021-10-02: .1 mg via INTRAVENOUS

## 2021-10-02 MED ORDER — HYDRALAZINE HCL 20 MG/ML IJ SOLN: 5.0000 mg | INTRAMUSCULAR | Status: DC | PRN

## 2021-10-02 MED ORDER — METFORMIN HCL 500 MG PO TABS
500.0000 mg | ORAL_TABLET | Freq: Two times a day (BID) | ORAL | Status: DC
  Administered 2021-10-02 – 2021-10-04 (×4): 500 mg via ORAL
  Filled 2021-10-02 (×4): qty 1

## 2021-10-02 MED ORDER — PROTAMINE SULFATE 10 MG/ML IV SOLN
INTRAVENOUS | Status: DC | PRN
  Administered 2021-10-02: 20 mg via INTRAVENOUS
  Administered 2021-10-02: 30 mg via INTRAVENOUS

## 2021-10-02 MED ORDER — POTASSIUM CHLORIDE CRYS ER 20 MEQ PO TBCR: 20.0000 meq | EXTENDED_RELEASE_TABLET | Freq: Every day | ORAL | Status: DC | PRN

## 2021-10-02 MED ORDER — HEPARIN 6000 UNIT IRRIGATION SOLUTION
Status: DC | PRN
  Administered 2021-10-02: 1

## 2021-10-02 MED ORDER — EPHEDRINE SULFATE-NACL 50-0.9 MG/10ML-% IV SOSY
PREFILLED_SYRINGE | INTRAVENOUS | Status: DC | PRN
Start: 2021-10-02 — End: 2021-10-02
  Administered 2021-10-02 (×2): 10 mg via INTRAVENOUS
  Administered 2021-10-02 (×2): 5 mg via INTRAVENOUS

## 2021-10-02 MED ORDER — ACETAMINOPHEN 325 MG PO TABS
325.0000 mg | ORAL_TABLET | ORAL | Status: DC | PRN
  Administered 2021-10-02 – 2021-10-04 (×5): 650 mg via ORAL
  Filled 2021-10-02 (×5): qty 2

## 2021-10-02 MED ORDER — EPHEDRINE 5 MG/ML INJ
INTRAVENOUS | Status: AC
  Filled 2021-10-02: qty 5

## 2021-10-02 SURGICAL SUPPLY — 80 items
BAG COUNTER SPONGE SURGICOUNT (BAG) ×2 IMPLANT
BAG SNAP BAND KOVER 36X36 (MISCELLANEOUS) ×1 IMPLANT
BALLN MUSTANG 4X60X75 (BALLOONS) ×2
BALLN MUSTANG 7X60X75 (BALLOONS) ×2
BALLOON MUSTANG 4X60X75 (BALLOONS) IMPLANT
BALLOON MUSTANG 7X60X75 (BALLOONS) IMPLANT
CANISTER SUCT 3000ML PPV (MISCELLANEOUS) ×2 IMPLANT
CATH BEACON 5 .035 65 KMP TIP (CATHETERS) IMPLANT
CATH OMNI FLUSH .035X70CM (CATHETERS) ×1 IMPLANT
CATH OMNI FLUSH 5F 65CM (CATHETERS) ×2 IMPLANT
CATH QUICKCROSS SUPP .035X90CM (MICROCATHETER) ×1 IMPLANT
CHLORAPREP W/TINT 26 (MISCELLANEOUS) ×2 IMPLANT
CLIP LIGATING EXTRA MED SLVR (CLIP) ×2 IMPLANT
CLIP LIGATING EXTRA SM BLUE (MISCELLANEOUS) ×4 IMPLANT
COVER DOME SNAP 22 D (MISCELLANEOUS) ×1 IMPLANT
COVER PROBE W GEL 5X96 (DRAPES) ×1 IMPLANT
DERMABOND ADVANCED (GAUZE/BANDAGES/DRESSINGS) ×1
DERMABOND ADVANCED .7 DNX12 (GAUZE/BANDAGES/DRESSINGS) ×1 IMPLANT
DRAIN CHANNEL 15F RND FF W/TCR (WOUND CARE) IMPLANT
DRAPE C-ARM 42X72 X-RAY (DRAPES) IMPLANT
ELECT REM PT RETURN 9FT ADLT (ELECTROSURGICAL) ×2
ELECTRODE REM PT RTRN 9FT ADLT (ELECTROSURGICAL) ×1 IMPLANT
EVACUATOR SILICONE 100CC (DRAIN) IMPLANT
GAUZE 4X4 16PLY ~~LOC~~+RFID DBL (SPONGE) ×1 IMPLANT
GLOVE BIO SURGEON STRL SZ7.5 (GLOVE) ×2 IMPLANT
GLOVE BIOGEL PI IND STRL 7.0 (GLOVE) IMPLANT
GLOVE BIOGEL PI IND STRL 8 (GLOVE) ×1 IMPLANT
GLOVE BIOGEL PI INDICATOR 7.0 (GLOVE) ×2
GLOVE BIOGEL PI INDICATOR 8 (GLOVE) ×1
GLOVE SRG 8 PF TXTR STRL LF DI (GLOVE) ×1 IMPLANT
GLOVE SURG POLYISO LF SZ8 (GLOVE) IMPLANT
GLOVE SURG SS PI 6.5 STRL IVOR (GLOVE) ×4 IMPLANT
GLOVE SURG UNDER POLY LF SZ8 (GLOVE) ×1
GOWN STRL REUS W/ TWL LRG LVL3 (GOWN DISPOSABLE) ×3 IMPLANT
GOWN STRL REUS W/TWL 2XL LVL3 (GOWN DISPOSABLE) ×2 IMPLANT
GOWN STRL REUS W/TWL LRG LVL3 (GOWN DISPOSABLE) ×3
GRAFT VASC PATCH XENOSURE 1X14 (Vascular Products) ×1 IMPLANT
HEMOSTAT SNOW SURGICEL 2X4 (HEMOSTASIS) ×2 IMPLANT
KIT BASIN OR (CUSTOM PROCEDURE TRAY) ×2 IMPLANT
KIT ENCORE 26 ADVANTAGE (KITS) ×2 IMPLANT
KIT TURNOVER KIT B (KITS) ×2 IMPLANT
NS IRRIG 1000ML POUR BTL (IV SOLUTION) ×4 IMPLANT
PACK ENDO MINOR (CUSTOM PROCEDURE TRAY) ×2 IMPLANT
PACK PERIPHERAL VASCULAR (CUSTOM PROCEDURE TRAY) ×2 IMPLANT
PAD ARMBOARD 7.5X6 YLW CONV (MISCELLANEOUS) ×4 IMPLANT
SET MICROPUNCTURE 5F STIFF (MISCELLANEOUS) IMPLANT
SHEATH BRITE TIP 8FR 23CM (SHEATH) ×1 IMPLANT
SHEATH PINNACLE 5F 10CM (SHEATH) ×2 IMPLANT
SHEATH PINNACLE 8F 10CM (SHEATH) ×2 IMPLANT
SPONGE T-LAP 18X18 ~~LOC~~+RFID (SPONGE) ×1 IMPLANT
STENT VIABAHN 10X120X8X (Permanent Stent) IMPLANT
STENT VIABAHN 8X100X120 (Permanent Stent) ×1 IMPLANT
STOPCOCK MORSE 400PSI 3WAY (MISCELLANEOUS) ×1 IMPLANT
SUT MNCRL AB 4-0 PS2 18 (SUTURE) ×2 IMPLANT
SUT PROLENE 5 0 C 1 24 (SUTURE) ×5 IMPLANT
SUT PROLENE 5 0 C 1 36 (SUTURE) ×1 IMPLANT
SUT PROLENE 6 0 BV (SUTURE) ×2 IMPLANT
SUT VIC AB 2-0 CT1 27 (SUTURE) ×1
SUT VIC AB 2-0 CT1 36 (SUTURE) ×1 IMPLANT
SUT VIC AB 2-0 CT1 TAPERPNT 27 (SUTURE) ×1 IMPLANT
SUT VIC AB 3-0 SH 27 (SUTURE) ×1
SUT VIC AB 3-0 SH 27X BRD (SUTURE) ×1 IMPLANT
SYR 10ML LL (SYRINGE) ×2 IMPLANT
SYR 20CC LL (SYRINGE) ×2 IMPLANT
SYR MEDRAD MARK V 150ML (SYRINGE) ×3 IMPLANT
TOWEL GREEN STERILE (TOWEL DISPOSABLE) ×2 IMPLANT
TRAY FOLEY MTR SLVR 16FR STAT (SET/KITS/TRAYS/PACK) IMPLANT
TUBING HIGH PRESSURE 120CM (CONNECTOR) IMPLANT
TUBING INJECTOR 48 (MISCELLANEOUS) ×1 IMPLANT
UNDERPAD 30X36 HEAVY ABSORB (UNDERPADS AND DIAPERS) ×2 IMPLANT
WATER STERILE IRR 1000ML POUR (IV SOLUTION) ×2 IMPLANT
WIRE AMPLATZ SS-J .035X180CM (WIRE) IMPLANT
WIRE AMPLATZ SS-J .035X260CM (WIRE) IMPLANT
WIRE BENTSON .035X145CM (WIRE) ×2 IMPLANT
WIRE G V18X300CM (WIRE) ×1 IMPLANT
WIRE MICRO SET SILHO 5FR 7 (SHEATH) ×1 IMPLANT
WIRE ROSEN 145CM (WIRE) ×1 IMPLANT
WIRE ROSEN-J .035X260CM (WIRE) ×1 IMPLANT
WIRE STARTER WORKHORSE .035X15 (WIRE) ×1 IMPLANT
WIRE TORQFLEX AUST .018X40CM (WIRE) ×1 IMPLANT

## 2021-10-02 NOTE — Anesthesia Procedure Notes (Signed)
Arterial Line Insertion ?Start/End5/08/2021 7:10 AM, 10/02/2021 7:15 AM ?Performed by: Duane Boston, MD, Vonna Drafts, CRNA, CRNA ? Patient location: Pre-op. ?Preanesthetic checklist: patient identified, IV checked, site marked, risks and benefits discussed, surgical consent, monitors and equipment checked, pre-op evaluation, timeout performed and anesthesia consent ?Lidocaine 1% used for infiltration ?Left, radial was placed ?Catheter size: 20 G ?Hand hygiene performed  and maximum sterile barriers used  ? ?Attempts: 1 ?Procedure performed without using ultrasound guided technique. ?Following insertion, dressing applied and Biopatch. ?Post procedure assessment: normal ? ?Patient tolerated the procedure well with no immediate complications. ? ? ?

## 2021-10-02 NOTE — Op Note (Signed)
? ? ?NAME: Jeremy Nicholson   MRN: [id number] ?DOB: 30-Aug-1945    DATE OF OPERATION: 10/02/2021 ? ?PREOP DIAGNOSIS:   ? 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 ?POSTOP DIAGNOSIS:   ? ?Same ? ?PROCEDURE:  ?  ?Right external iliac stenting -8 x 10 cm Viabahn ?Iliofemoral endarterectomy extending onto the profunda, patched using bovine pericardium ? ?SURGEON: [name] ? ?ASSIST: Paulo Fruit PA ? ?ANESTHESIA: General ? ?EBL: 300 mL ? ?INDICATIONS:  ? ?Jeremy Nicholson is a 76 y.o. male presenting with right lower extremity rest pain, left lower extremity lifestyle limiting claudication.  ABIs demonstrate monophasic waveforms bilaterally with absent toe pressure on the right, 60 mmHg left.  On physical exam, right femoral pulse was nonpalpable, left femoral pulse 1+. Pedal pulses were not palpable. ?Recent CT angio was reviewed demonstrating severe, flow-limiting stenosis in bilateral external iliac arteries, with near occlusion of the right common femoral artery.  There is also ostial stenosis at the left profunda. Severe superficial femoral artery disease bilaterally.   ?Jeremy Nicholson would benefit from right common femoral endarterectomy  iliac artery stenting for right-sided Rutherford 4 critical limb ischemia.  Common femoral artery reconstruction will also require profunda patch plasty.  Of note, Jeremy Nicholson is [employer], and does not accept blood product. ?I had a long conversation with he and his wife regarding the above.  After discussing the risk and benefits, Jeremy Nicholson elected to proceed.  We will work to schedule in the coming weeks. ? ?FINDINGS:  ? ?Severe stenosis through the right external iliac, near-occlusion of the common femoral artery and ostia of the profunda.  Superficial femoral artery occluded. ? ?TECHNIQUE:  ? ?Patient was brought to the OR laid in the supine position.  General anesthesia was induced and the patient was prepped and draped in standard fashion.  The case began  with ultrasound insonation of the right common femoral artery.  This was marked and an oblique incision was made.  The right common femoral artery was exposed in standard fashion.  This was extended proximally and a vessel loop was placed around the distal external iliac artery.  Distally, the superficial femoral artery and profunda were controlled with Vesseloops.   ? ?Next, I moved to the endovascular portion of the case.  The right common femoral artery was accessed using a micropuncture needle and wire.  There was difficulty traversing the external iliac artery, and the tip of the micropuncture wire sheared in the patient's external iliac artery.  Another wire was used and I was able to traverse the external iliac artery, common iliac artery, ensuring I was in true lumen. Angiography followed demonstrating near occlusion of nearly the entirety the external iliac artery.  The patient was heparinized with 10000 units IV heparin to an ACT greater than 250 for the remainder of the case.  The lesion was so dense, I was unable to pass an 8 [place] sheath.  I moved to the balloon and swallow technique using a 4 mm balloon, which allowed me to deliver the sheath into the common iliac artery.  From this location, a marker pigtail was used and length measurement made.  Using prior CT scan, I elected to deploy an 8 mm x 10 cm Viabahn stent.  This deployed nicely and was postdilated with a 7 mm balloon.  On completion imaging, there was an area of ulcerated plaque in th common iliac artery, that was present at the beginning of the case.  I elected not to extend  the stents further.  Initially I thought I have covered the hypogastric artery, however it filled normally. ? ?I then moved to the second portion of the case - right iliofemoral endarterectomy. ?The external iliac artery was controlled using an angioplasty balloon in the previously placed Viabahn stent.  Arteries were controlled distally, and a longitudinal  arteriotomy was made.  There was severe, dense atherosclerotic disease throughout the common femoral artery and external iliac artery that was nearly occlusive.  The arteriotomy was extended to the Viabahn stent that was in the distal external iliac artery.  Distally, the profunda was endarterectomized as well as the superficial femoral artery. There was excellent back-bleeding from the profunda. The SFA was chronically occluded, but I endarterectomized for possible future endovascular intervention. Next, a long bovine pericardial patch was brought onto the field and sewn in running fashion.  Prior to completion, the profunda was backbled to ensure the patch did not have major leaks, followed by balloon deflation, removal, and patch completion. ? ? ?Given the complexity of the case,  the assistant was necessary in order to expedient the procedure and safely perform the technical aspects of the operation.  The assistant provided traction and countertraction to assist with exposure of the artery proximally and distally.  They assisted with suture ligature of multiple branches.  Their assistance was critical in sewing the patch. These skills, especially following the Prolene suture for the anastomosis, could not have been adequately performed by a scrub tech assistant.   ? ?Macie Burows, MD ?[facility] ? ?DATE OF DICTATION:   10/02/2021 ? ?

## 2021-10-02 NOTE — H&P (Signed)
?Office Note  ? ?Patient seen and examined in preop holding.  No complaints. ?No changes to medication history or physical exam since last seen in clinic. ?After discussing the risks and benefits of right common femoral endarterectomy, retrograde stenting, Jeremy Nicholson elected to proceed.  ? ?Jeremy John MD ? ? ?CC: bilateral lower extremity foot pain ?Requesting Provider:  No ref. provider found ? ?HPI: Jeremy Nicholson is a 76 y.o. ([birth year]) male presenting at the request of .Jeremy Prose, MD with bilateral lower extremity foot pain right greater than left.  Jeremy Nicholson is retired from [employer].  Originally from [place], he moved to [place] at the request of his wife. ? ?Jeremy Nicholson presents today to discuss interval CT scan which was ordered to assess inflow disease. ? ?Exam, Jill was doing well,, accompanied by his wife.  He had no complaints.  He continues to have rest pain in the right lower extremity, especially appreciated at night.  This improves with laying his foot in the dependent position.  Left lower extremity pain is improved.  Not lifestyle limiting at this time. ? ?Jeremy Nicholson was referred by Dr. Posey Pronto, [place].M. who has administered multiple steroid injections for musculoskeletal pain.  Jeremy Nicholson is a disabled veteran, and big [employer] fan from his time in [place]. ? ? ? ?The pt is  on a statin for cholesterol management.  ?The pt is  on a daily aspirin.   Other AC:  - ?The pt is  on medication for hypertension.   ?The pt is  diabetic.  ?Tobacco hx:  - ? ?Past Medical History:  ?Diagnosis Date  ? Aortic valve stenosis, moderate   ? per echo 06-02-2017 (in epic)  ef 55-60%,  possible bicuspid AV,  severeely thickened and calcified with severely restricted leaflet opening,  valve area 1.17cm^2, mean gradient 66mHg, peak gradient 560mg,  moderate AR  ? Arthritis   ? oa  ? BPH associated with nocturia   ? [facility]  ? [facility] cancer  ? Complication of anesthesia   ? due to heart murmur  mod  to severe aortic stenosis  ? Coronary artery disease   ? Full dentures   ? Glaucoma, both eyes   ? Heart murmur, systolic   ? Hiatal hernia   ? History of kidney stones   ? Hypertension   ? Left ureteral stone   ? [facility]  [facility] [place] [facility]  ? [facility]ipheral vascular disease ([facility]  ? S[facility] (systemic inflammatory response syndrome) [facility]  ? hospital admission--  unknown etiology  ? Type 2 diabetes mellitus ([facility]  ? Wears glasses   ? ? ?Past Surgical History:  ?Procedure Laterality Date  ? COLONOSCOPY    ? CORONARY ARTERY BYPASS GRAFT    ? pt reports Aortic valve replacement and CABG in 2020  ? CYSTOSCOPY WITH RETROGRADE PYELOGRAM, URETEROSCOPY AND STENT PLACEMENT Left 02/04/2018  ? Procedure: CYSTOSCOPY WITH RETROGRADE PYELOGRAM, URETEROSCOPY AND STENT EXIngenioF STONE;  Surgeon: Jeremy FrockMD;  Location: [facility];  Service: Urology;  Laterality: Left;  ? EXAM UNDER ANESTHESIA WITH MANIPULATION OF KNEE Left 09/30/2017  ? Procedure: EXAM UNDER ANESTHESIA WITH MANIPULATION OF KNEE;  Surgeon: CoSydnee CabalMD;  Location: [facility];  Service: Orthopedics;  Laterality: Left;  30 mins  ? PROSTATE BIOPSY  07/2017  ? cancerous polyps x2  [facility] TOTAL KNEE ARTHROPLASTY Left 05/28/2017  ?  Procedure: LEFT TOTAL KNEE ARTHROPLASTY;  Surgeon: Jeremy Cabal, MD;  Location: [facility];  Service: Orthopedics;  Laterality: Left;  ? TRANSTHORACIC ECHOCARDIOGRAM  06/02/2017  ? ef 55-60%/  possible AV bicuspid,  severely thickened and calcified leaflets with severely restricted leaflet opening, moderate regurg.(valve area 1.17cm^2,  mean gradient 61mHg, peak gradient 531mg)/  mild LAE/  mild TR/  mild MV thickened leaflets and regurg. (not restricted and no stenosis  ? ? ?Social History  ? ?Socioeconomic History  ? Marital status: Married  ?  Spouse name: Not on file  ? Number of children: Not on file  ? Years of education: Not  on file  ? Highest education level: Not on file  ?Occupational History  ? Not on file  ?Tobacco Use  ? Smoking status: Former  ?  Years: 30.00  ?  Types: Cigarettes  ?  Quit date: 01/28/1995  ?  Years since quitting: 26.6  ? Smokeless tobacco: Never  ?Vaping Use  ? Vaping Use: Never used  ?Substance and Sexual Activity  ? Alcohol use: No  ? Drug use: No  ? Sexual activity: Not Currently  ?Other Topics Concern  ? Not on file  ?Social History Narrative  ? Not on file  ? ?Social Determinants of Health  ? ?Financial Resource Strain: Not on file  ?Food Insecurity: Not on file  ?Transportation Needs: Not on file  ?Physical Activity: Not on file  ?Stress: Not on file  ?Social Connections: Not on file  ?Intimate Partner Violence: Not on file  ? ?History reviewed. No pertinent family history. ? ?Current Facility-Administered Medications  ?Medication Dose Route Frequency Provider Last Rate Last Admin  ? 0.9 %  sodium chloride infusion   Intravenous Continuous RoBroadus JohnMD      ? ceFAZolin (ANCEF) 2-4 GM/100ML-% IVPB           ? ceFAZolin (ANCEF) IVPB 2g/100 mL premix  2 g Intravenous 30 min Pre-Op RoBroadus JohnMD      ? Chlorhexidine Gluconate Cloth 2 % PADS 6 each  6 each Topical Once RoBroadus JohnMD      ? And  ? Chlorhexidine Gluconate Cloth 2 % PADS 6 each  6 each Topical Once RoBroadus JohnMD      ? insulin aspart (novoLOG) injection 0-7 Units  0-7 Units Subcutaneous Q2H PRN SiDuane BostonMD      ? lactated ringers infusion   Intravenous Continuous Jeremy MouseMD      ? ? ?Allergies  ?Allergen Reactions  ? Atorvastatin Other (See Comments)  ?  Muscle pain  ? Chlorthalidone Other (See Comments)  ?  Pt unsure of reaction  ? Pravastatin Other (See Comments)  ?  Muscle pain  ? Latex Hives  ? Lisinopril Hives  ? Rosuvastatin Other (See Comments)  ?  Muscle pain  ? Tape Hives  ?  Latex tape  ? ? ? ?REVIEW OF SYSTEMS:  ? ?'[X]'$  denotes positive finding, '[ ]'$  denotes negative finding ?Cardiac   Comments:  ?Chest pain or chest pressure:    ?Shortness of breath upon exertion:    ?Short of breath when lying flat:    ?Irregular heart rhythm:    ?    ?Vascular    ?Pain in calf, thigh, or hip brought on by ambulation: X   ?Pain in feet at night that wakes you up from your sleep:     ?Blood clot in your veins:    ?Leg  swelling:     ?    ?Pulmonary    ?Oxygen at home:    ?Productive cough:     ?Wheezing:     ?    ?Neurologic    ?Sudden weakness in arms or legs:     ?Sudden numbness in arms or legs:     ?Sudden onset of difficulty speaking or slurred speech:    ?Temporary loss of vision in one eye:     ?Problems with dizziness:     ?    ?Gastrointestinal    ?Blood in stool:     ?Vomited blood:     ?    ?Genitourinary    ?Burning when urinating:     ?Blood in urine:    ?    ?Psychiatric    ?Major depression:     ?    ?Hematologic    ?Bleeding problems:    ?Problems with blood clotting too easily:    ?    ?Skin    ?Rashes or ulcers:    ?    ?Constitutional    ?Fever or chills:    ? ? ?PHYSICAL EXAMINATION: ? ?Vitals:  ? 10/02/21 0545  ?BP: (!) 163/71  ?Pulse: 83  ?Resp: 18  ?Temp: 97.7 ?F (36.5 ?C)  ?TempSrc: Oral  ?SpO2: 100%  ?Weight: 83.6 kg  ?Height: '5\' 9"'$  (1.753 m)  ? ? ? ?General:  WDWN in NAD; vital signs documented above ?Gait: Not observed ?HENT: WNL, normocephalic ?Pulmonary: normal non-labored breathing , without wheezing ?Cardiac: regular HR ?Abdomen: soft, NT, no masses ?Skin: without rashes ?Vascular Exam/Pulses: ? Right Left  ?Radial 2+ (normal) 2+ (normal)  ?Ulnar 2+ (normal) 2+ (normal)  ?Femoral absent 1+ (weak)  ?Popliteal absent absent  ?DP absent absent  ?PT absent absent  ? ?Extremities: without ischemic changes, without Gangrene , without cellulitis; without open wounds;  ?Musculoskeletal: no muscle wasting or atrophy  ?Neurologic: A&O X 3;  No focal weakness or paresthesias are detected ?Psychiatric:  The pt has Normal affect. ? ? ?Non-Invasive Vascular Imaging:   ?ABI Findings:   ?+---------+------------------+-----+----------+--------+  ?Right    Rt Pressure (mmHg)IndexWaveform  Comment   ?+---------+------------------+-----+----------+--------+  ?Brachial 123

## 2021-10-02 NOTE — Anesthesia Postprocedure Evaluation (Signed)
Anesthesia Post Note ? ?Patient: Jeremy Nicholson ? ?Procedure(s) Performed: RIGHT COMMON FEMORAL ENDARTERECTOMY WITH 1CM X 14CM XENOSURE PATCH (Right: Groin) ?INSERTION OF RIGHT ILIAC STENT (Right: Groin) ?PATCH ANGIOPLASTY (Right: Groin) ? ?  ? ?Patient location during evaluation: PACU ?Anesthesia Type: General ?Level of consciousness: sedated ?Pain management: pain level controlled ?Vital Signs Assessment: post-procedure vital signs reviewed and stable ?Respiratory status: spontaneous breathing and respiratory function stable ?Cardiovascular status: stable ?Postop Assessment: no apparent nausea or vomiting ?Anesthetic complications: no ? ? ?No notable events documented. ? ?Last Vitals:  ?Vitals:  ? 10/02/21 1208 10/02/21 1223  ?BP: 124/64 140/61  ?Pulse: 68 65  ?Resp: 10 11  ?Temp: 36.8 ?C   ?SpO2: 96% 95%  ?  ?Last Pain:  ?Vitals:  ? 10/02/21 1208  ?TempSrc:   ?PainSc: Asleep  ? ? ?  ?  ?  ?  ?  ?  ? ?[name] DANIEL ? ? ? ? ?

## 2021-10-02 NOTE — Anesthesia Procedure Notes (Signed)
Procedure Name: Intubation ?Date/Time: 10/02/2021 7:41 AM ?Performed by: Vonna Drafts, CRNA ?Pre-anesthesia Checklist: Patient identified, Emergency Drugs available, Suction available and Patient being monitored ?Patient Re-evaluated:Patient Re-evaluated prior to induction ?Oxygen Delivery Method: Circle system utilized ?Preoxygenation: Pre-oxygenation with 100% oxygen ?Induction Type: IV induction ?Ventilation: Mask ventilation without difficulty ?Laryngoscope Size: Mac and 4 ?Grade View: Grade I ?Tube type: Oral ?Tube size: 7.5 mm ?Number of attempts: 1 ?Airway Equipment and Method: Stylet ?Placement Confirmation: ETT inserted through vocal cords under direct vision, positive ETCO2 and breath sounds checked- equal and bilateral ?Secured at: 23 cm ?Tube secured with: Tape ?Dental Injury: Teeth and Oropharynx as per pre-operative assessment  ? ? ? ? ?

## 2021-10-02 NOTE — Transfer of Care (Signed)
Immediate Anesthesia Transfer of Care Note ? ?Patient: Jeremy Nicholson ? ?Procedure(s) Performed: RIGHT COMMON FEMORAL ENDARTERECTOMY WITH 1CM X 14CM XENOSURE PATCH (Right: Groin) ?INSERTION OF RIGHT ILIAC STENT (Right: Groin) ?PATCH ANGIOPLASTY (Right: Groin) ? ?Patient Location: PACU ? ?Anesthesia Type:General ? ?Level of Consciousness: drowsy ? ?Airway & Oxygen Therapy: Patient Spontanous Breathing and Patient connected to face mask oxygen ? ?Post-op Assessment: Report given to RN and Post -op Vital signs reviewed and stable ? ?Post vital signs: Reviewed and stable ? ?Last Vitals:  ?Vitals Value Taken Time  ?BP 121/68 10/02/21 1137  ?Temp    ?Pulse 73 10/02/21 1136  ?Resp 10 10/02/21 1136  ?SpO2 100 % 10/02/21 1136  ?Vitals shown include unvalidated device data. ? ?Last Pain:  ?Vitals:  ? 10/02/21 0600  ?TempSrc:   ?PainSc: 0-No pain  ?   ? ?  ? ?Complications: No notable events documented. ?

## 2021-10-03 LAB — GLUCOSE, CAPILLARY
Glucose-Capillary: 146 mg/dL — ABNORMAL HIGH (ref 70–99)
Glucose-Capillary: 182 mg/dL — ABNORMAL HIGH (ref 70–99)
Glucose-Capillary: 141 mg/dL — ABNORMAL HIGH (ref 70–99)
Glucose-Capillary: 182 mg/dL — ABNORMAL HIGH (ref 70–99)

## 2021-10-03 LAB — CBC
HCT: 35.7 % — ABNORMAL LOW (ref 39.0–52.0)
Hemoglobin: 12.2 g/dL — ABNORMAL LOW (ref 13.0–17.0)
MCH: 30.4 pg (ref 26.0–34.0)
MCHC: 34.2 g/dL (ref 30.0–36.0)
MCV: 89 fL (ref 80.0–100.0)
Platelets: 173 10*3/uL (ref 150–400)
RBC: 4.01 MIL/uL — ABNORMAL LOW (ref 4.22–5.81)
RDW: 13.3 % (ref 11.5–15.5)
WBC: 6.1 10*3/uL (ref 4.0–10.5)
nRBC: 0 % (ref 0.0–0.2)

## 2021-10-03 LAB — LIPID PANEL
Cholesterol: 104 mg/dL (ref 0–200)
HDL: 25 mg/dL — ABNORMAL LOW (ref >40)
LDL Cholesterol: 59 mg/dL (ref 0–99)
Total CHOL/HDL Ratio: 4.2 RATIO
Triglycerides: 99 mg/dL (ref <150)
VLDL: 20 mg/dL (ref 0–40)

## 2021-10-03 LAB — BASIC METABOLIC PANEL
Anion gap: 5 (ref 5–15)
BUN: 12 mg/dL (ref 8–23)
CO2: 22 mmol/L (ref 22–32)
Calcium: 8.3 mg/dL — ABNORMAL LOW (ref 8.9–10.3)
Chloride: 110 mmol/L (ref 98–111)
Creatinine, Ser: 1.3 mg/dL — ABNORMAL HIGH (ref 0.61–1.24)
GFR, Estimated: 58 mL/min — ABNORMAL LOW (ref >60)
Glucose, Bld: 119 mg/dL — ABNORMAL HIGH (ref 70–99)
Potassium: 3.6 mmol/L (ref 3.5–5.1)
Sodium: 137 mmol/L (ref 135–145)

## 2021-10-03 NOTE — Progress Notes (Addendum)
?  Progress Note ? ? ? ?10/03/2021 ?7:43 AM ?1 Day Post-Op ? ?Subjective:  R foot feels better compared to pre-op ? ? ?Vitals:  ? 10/02/21 2000 10/03/21 0000  ?BP: (!) 151/65 (!) 148/68  ?Pulse: 86 77  ?Resp: 18 16  ?Temp: 97.6 ?F (36.4 ?C) 98.7 ?F (37.1 ?C)  ?SpO2: 97% 96%  ? ?Physical Exam ?Lungs:  non labored ?Incisions:  R groin incision c/d/i ?Extremities:  multiphasic R PT by doppler ?Abdomen:  soft ?Neurologic: A&O ? ?CBC ?   ?Component Value Date/Time  ? WBC 6.1 10/03/2021 0344  ? RBC 4.01 (L) 10/03/2021 0344  ? HGB 12.2 (L) 10/03/2021 0344  ? HCT 35.7 (L) 10/03/2021 0344  ? PLT 173 10/03/2021 0344  ? MCV 89.0 10/03/2021 0344  ? MCH 30.4 10/03/2021 0344  ? MCHC 34.2 10/03/2021 0344  ? RDW 13.3 10/03/2021 0344  ? LYMPHSABS 1.3 05/31/2017 0527  ? MONOABS 1.7 (H) 05/31/2017 0527  ? EOSABS 0.1 05/31/2017 0527  ? BASOSABS 0.0 05/31/2017 0527  ? ? ?BMET ?   ?Component Value Date/Time  ? NA 137 10/03/2021 0344  ? K 3.6 10/03/2021 0344  ? CL 110 10/03/2021 0344  ? CO2 22 10/03/2021 0344  ? GLUCOSE 119 (H) 10/03/2021 0344  ? BUN 12 10/03/2021 0344  ? CREATININE 1.30 (H) 10/03/2021 0344  ? CALCIUM 8.3 (L) 10/03/2021 0344  ? GFRNONAA 58 (L) 10/03/2021 0344  ? GFRAA >60 02/03/2018 0834  ? ? ?INR ?   ?Component Value Date/Time  ? INR 1.0 09/29/2021 0920  ? ? ? ?Intake/Output Summary (Last 24 hours) at 10/03/2021 0743 ?Last data filed at 10/03/2021 0626 ?Gross per 24 hour  ?Intake 2329.22 ml  ?Output 5575 ml  ?Net -3245.78 ml  ? ? ? ?Assessment/Plan:  76 y.o. male is s/p R iliofemoral endarterectomy and EIA stent 1 Day Post-Op  ? ?R foot well perfused based on doppler exam; R foot feels subjectively better ?OOB today with therapy ?Continue DAPT ?Probable d/c home tomorrow ? ? ? ?Dagoberto Ligas, PA-C ?[facility] ?[phone] ?10/03/2021 ?7:43 AM ? ? ?I agree with the above.  I have seen and evaluated the patient.  He is postop day 1 status post iliofemoral endarterectomy and external iliac stent by Dr. Unk Lightning  yesterday.  He states his foot feels much better today.  He will be mobilized today with plans for discharge home tomorrow on dual antiplatelet therapy. ? ?Annamarie Major ?

## 2021-10-03 NOTE — Evaluation (Signed)
Occupational Therapy Evaluation Patient Details Name: Jeremy Nicholson MRN: [id number] DOB: 1945-10-14 Today's Date: 10/03/2021   History of Present Illness Patient is a 76 yo male admitted status post R common femoral endarterectomy  iliac artery stenting for right-sided Rutherford 4 critical limb ischemia. PMH includes: Jehovah's witness, prostate cnacer, MI, DM2, HTN, PVD, and glaucoma   Clinical Impression   Prior to this admission, patient was independent in his ADLs and IADLs, occasionally driving short distances. Stating he liked to walk for exercise before the pain got too bad and was using a cane to assist with ambulation recently. Currently, patient is min A for lower body ADLs, and min guard for transfers. Patient utilizing RW in session due to pain (7/10) at incision site when ambulating (OT recommending RW for home use initially). OT not recommending OT services at discharge, with all questions answered from wife Jeremy Nicholson and patient. OT will continue to follow acutely to address deficits listed below.     Recommendations for follow up therapy are one component of a multi-disciplinary discharge planning process, led by the attending physician.  Recommendations may be updated based on patient status, additional functional criteria and insurance authorization.   Follow Up Recommendations  No OT follow up    Assistance Recommended at Discharge Set up Supervision/Assistance  Patient can return home with the following A little help with walking and/or transfers;A little help with bathing/dressing/bathroom;Assist for transportation;Assistance with cooking/housework    Functional Status Assessment  Patient has had a recent decline in their functional status and demonstrates the ability to make significant improvements in function in a reasonable and predictable amount of time.  Equipment Recommendations  Other (comment) Adult nurse)    Recommendations for Other Services        Precautions / Restrictions Precautions Precautions: Fall Restrictions Weight Bearing Restrictions: No      Mobility Bed Mobility Overal bed mobility: Needs Assistance Bed Mobility: Supine to Sit     Supine to sit: Supervision     General bed mobility comments: minimal increased time to come to sitting, HOB elevated    Transfers Overall transfer level: Needs assistance Equipment used: Rolling walker (2 wheels) Transfers: Sit to/from Stand Sit to Stand: Supervision           General transfer comment: Cues for hand placement, bed elevated to simulate bed height at home      Balance Overall balance assessment: Needs assistance Sitting-balance support: Single extremity supported, No upper extremity supported, Feet supported Sitting balance-Leahy Scale: Fair     Standing balance support: Bilateral upper extremity supported, During functional activity, Reliant on assistive device for balance Standing balance-Leahy Scale: Poor Standing balance comment: Reliant on RW (more so due to pain)                           ADL either performed or assessed with clinical judgement   ADL Overall ADL's : Needs assistance/impaired Eating/Feeding: Set up;Sitting   Grooming: Wash/dry hands;Set up;Standing   Upper Body Bathing: Set up;Sitting   Lower Body Bathing: Minimal assistance;Sitting/lateral leans;Sit to/from stand   Upper Body Dressing : Set up;Sitting   Lower Body Dressing: Minimal assistance;Sitting/lateral leans;Sit to/from stand   Toilet Transfer: Min guard;Ambulation;Regular Social worker and Hygiene: Min guard;Sit to/from stand       Functional mobility during ADLs: Minimal assistance;Cueing for safety;Cueing for sequencing;Rolling walker (2 wheels) General ADL Comments: Patient presenting with minimal decrease in activity tolerance, with  greatest complaint with pain at R incision site     Vision Baseline Vision/History:  1 Wears glasses;3 Glaucoma Ability to See in Adequate Light: 0 Adequate Patient Visual Report: No change from baseline       Perception     Praxis      Pertinent Vitals/Pain Pain Assessment Pain Assessment: 0-10 Pain Score: 7  Pain Location: Incisional site R leg Pain Descriptors / Indicators: Discomfort, Grimacing, Guarding Pain Intervention(s): Limited activity within patient's tolerance, Monitored during session, Repositioned     Hand Dominance     Extremity/Trunk Assessment Upper Extremity Assessment Upper Extremity Assessment: Overall WFL for tasks assessed   Lower Extremity Assessment Lower Extremity Assessment: Defer to PT evaluation   Cervical / Trunk Assessment Cervical / Trunk Assessment: Normal   Communication Communication Communication: No difficulties   Cognition Arousal/Alertness: Awake/alert Behavior During Therapy: WFL for tasks assessed/performed Overall Cognitive Status: Within Functional Limits for tasks assessed                                 General Comments: Alert and participatory     General Comments  VSS on RA    Exercises     Shoulder Instructions      Home Living Family/patient expects to be discharged to:: Private residence Living Arrangements: Spouse/significant other Available Help at Discharge: Family Type of Home: House Home Access: Stairs to enter Secretary/administrator of Steps: 2   Home Layout: Two level;Able to live on main level with bedroom/bathroom     Bathroom Shower/Tub: Producer, television/film/video: Standard     Home Equipment: Shower seat - built in;Cane - single point          Prior Functioning/Environment Prior Level of Function : Independent/Modified Independent;Driving             Mobility Comments: walking with cane due to pain recently ADLs Comments: independent in all aspects of care, occasionally drives short distances        OT Problem List: Decreased activity  tolerance;Decreased strength;Impaired balance (sitting and/or standing);Decreased coordination;Decreased knowledge of use of DME or AE;Pain      OT Treatment/Interventions: Self-care/ADL training;Therapeutic exercise;Energy conservation;DME and/or AE instruction;Manual therapy;Therapeutic activities;Patient/family education;Balance training    OT Goals(Current goals can be found in the care plan section) Acute Rehab OT Goals Patient Stated Goal: to get back to walking for exercise at home OT Goal Formulation: With patient Time For Goal Achievement: 10/17/21 Potential to Achieve Goals: Good ADL Goals Pt Will Perform Lower Body Bathing: Independently;sit to/from stand;sitting/lateral leans Pt Will Perform Lower Body Dressing: Independently;sitting/lateral leans;sit to/from stand Pt Will Transfer to Toilet: Independently;ambulating;regular height toilet Additional ADL Goal #1: Patient will verbalize 3 strategies for fall prevention for safe discharge home.  OT Frequency: Min 2X/week    Co-evaluation              AM-PAC OT "6 Clicks" Daily Activity     Outcome Measure Help from another person eating meals?: A Little Help from another person taking care of personal grooming?: A Little Help from another person toileting, which includes using toliet, bedpan, or urinal?: A Little Help from another person bathing (including washing, rinsing, drying)?: A Little Help from another person to put on and taking off regular upper body clothing?: A Little Help from another person to put on and taking off regular lower body clothing?: A Little 6 Click Score: 18   End  of Session Equipment Utilized During Treatment: Gait belt;Rolling walker (2 wheels) Nurse Communication: Mobility status  Activity Tolerance: Patient tolerated treatment well Patient left: in chair;with call bell/phone within reach;with chair alarm set;with family/visitor present  OT Visit Diagnosis: Unsteadiness on feet  (R26.81);Other abnormalities of gait and mobility (R26.89);Pain Pain - Right/Left: Right Pain - part of body: Leg                Time: 1610-9604 OT Time Calculation (min): 20 min Charges:  OT General Charges $OT Visit: 1 Visit OT Evaluation $OT Eval Moderate Complexity: 1 Mod  Pollyann Glen E. [place] Nordquist, OTR/L Acute Rehabilitation Services [phone]   Cherlyn Cushing 10/03/2021, 9:45 AM

## 2021-10-03 NOTE — Progress Notes (Signed)
Mobility Specialist Progress Note ? ? 10/03/21 1600  ?Mobility  ?Activity Ambulated independently in hallway  ?Level of Assistance Contact guard assist, steadying assist  ?Assistive Device None  ?Distance Ambulated (ft) 226 ft  ?Activity Response Tolerated well  ?$Mobility charge 1 Mobility  ? ?Pre Mobility: 68 HR, 121/ 74 BP, 100% SpO2 ?During Mobility: 112 HR, 100% SpO2 ?Post Mobility: 74 HR, 148/82 BP, 92% SpO2 ? ?Received pt in bed having slight discomfort at groin site but agreeable. Able to ambulate in hall w/o AD and no faults, required railing on wall for support but had x5 bouts of ambulation w/o any support at all, CG for safety. Returned back to bed w/ call bell in reach and wife in room.  ? ?Holland Falling ?Mobility Specialist ?Phone Number [phone] ? ?

## 2021-10-03 NOTE — Evaluation (Signed)
Physical Therapy Evaluation & Discharge ?Patient Details ?Name: Jeremy Nicholson ?MRN: [id number] ?DOB: 1945/11/25 ?Today's Date: 10/03/2021 ? ?History of Present Illness ? Patient is a 76 yo male admitted 10/02/21 status post R common femoral endarterectomy  iliac artery stenting for right-sided Rutherford 4 critical limb ischemia. PMH includes: Jehovah's witness, prostate cnacer, MI, DM2, HTN, PVD, and glaucoma ?  ?Clinical Impression ? Pt presents with condition above. Prior to worsening of his R leg pain, pt was IND without DME but has recently began to use a cane due to increasing R leg pain. Pt lives with his wife in a 2-level house with 2 STE. Currently, pt is demonstrating improved R lower extremity sensation (reportedly normal and intact currently) compared to prior to surgery. Pt with R hip incisional site pain limiting R hip strength and speed and stride length of gait. Despite the pain, pt demonstrates good static and dynamic balance without UE support, progressing from needing SUP with RW initially for safety with gait to no UE support at a mod I level during the session. He benefits from the RW for pain management with longer distance mobility. Educated pt to mobilize with staff, how to progress mobility, and how to unhook/hook up lines/leads to mobilize around room. He verbalized understanding. As pt appears to be close to baseline and will likely progress back to baseline quickly, pt does not need any further acute PT or follow-up PT services. All education completed and questions answered, PT will sign off. ?   ? ?Recommendations for follow up therapy are one component of a multi-disciplinary discharge planning process, led by the attending physician.  Recommendations may be updated based on patient status, additional functional criteria and insurance authorization. ? ?Follow Up Recommendations No PT follow up ? ?  ?Assistance Recommended at Discharge PRN  ?Patient can return home with the following ? Help  with stairs or ramp for entrance;Assist for transportation;Assistance with cooking/housework ? ?  ?Equipment Recommendations Rolling walker (2 wheels)  ?Recommendations for Other Services ?    ?  ?Functional Status Assessment Patient has had a recent decline in their functional status and demonstrates the ability to make significant improvements in function in a reasonable and predictable amount of time.  ? ?  ?Precautions / Restrictions Precautions ?Precautions: None ?Restrictions ?Weight Bearing Restrictions: No  ? ?  ? ?Mobility ? Bed Mobility ?Overal bed mobility: Modified Independent ?Bed Mobility: Supine to Sit, Sit to Supine ?  ?  ?Supine to sit: Modified independent (Device/Increase time), HOB elevated ?Sit to supine: Modified independent (Device/Increase time), HOB elevated ?  ?General bed mobility comments: No assistance needed, extra time due to pain ?  ? ?Transfers ?Overall transfer level: Needs assistance ?Equipment used: Rolling walker (2 wheels) ?Transfers: Sit to/from Stand ?Sit to Stand: Supervision ?  ?  ?  ?  ?  ?General transfer comment: Cues for hand placement, no LOB, Supervision for safety ?  ? ?Ambulation/Gait ?Ambulation/Gait assistance: Supervision, Modified independent (Device/Increase time) ?Gait Distance (Feet): 240 Feet ?Assistive device: Rolling walker (2 wheels), None ?Gait Pattern/deviations: Step-through pattern, Decreased stride length ?Gait velocity: reduced ?Gait velocity interpretation: 1.31 - 2.62 ft/sec, indicative of limited community ambulator ?  ?General Gait Details: Pt initially using RW for pain management, holding shoulders in elevated position needed cueing to relax them. Supervision initially for safety. Pt able to progress to no UE support with good stability and similar gait pattern as when using RW. Pt with semi-slow gait and short strides, but able to increase  both when cued without LOB while ambulating without UE support. Able to change head positions and stop  when cued without LOB without UE support. Pt mod I with or without UE support by end of gait bout. ? ?Stairs ?  ?  ?  ?  ?General stair comments: Educated pt on ascending leading with L leg and descending leading with R leg for comfort. He verbalized understanding. ? ?Wheelchair Mobility ?  ? ?Modified Rankin (Stroke Patients Only) ?  ? ?  ? ?Balance Overall balance assessment: Needs assistance ?Sitting-balance support: No upper extremity supported, Feet supported ?Sitting balance-Leahy Scale: Good ?  ?  ?Standing balance support: Bilateral upper extremity supported, During functional activity, No upper extremity supported ?Standing balance-Leahy Scale: Good ?Standing balance comment: Able to ambulate with dynamic challenges without LOB with RW and without UE support, no LOB ?  ?  ?  ?  ?  ?  ?  ?  ?  ?  ?  ?   ? ? ? ?Pertinent Vitals/Pain Pain Assessment ?Pain Assessment: Faces ?Faces Pain Scale: Hurts a little bit ?Pain Location: Incisional site R leg ?Pain Descriptors / Indicators: Discomfort, Grimacing, Guarding ?Pain Intervention(s): Limited activity within patient's tolerance, Monitored during session, Premedicated before session, Repositioned  ? ? ?Home Living Family/patient expects to be discharged to:: Private residence ?Living Arrangements: Spouse/significant other ?Available Help at Discharge: Family ?Type of Home: House ?Home Access: Stairs to enter ?  ?Entrance Stairs-Number of Steps: 2 ?  ?Home Layout: Two level;Able to live on main level with bedroom/bathroom ?Home Equipment: Shower seat - built in;Cane - single point ?   ?  ?Prior Function Prior Level of Function : Independent/Modified Independent;Driving ?  ?  ?  ?  ?  ?  ?Mobility Comments: walking with cane due to pain recently but otherwise was IND without DME prior to worsening of pain. No recent falls ?ADLs Comments: independent in all aspects of care, occasionally drives short distances ?  ? ? ?Hand Dominance  ?   ? ?  ?Extremity/Trunk  Assessment  ? Upper Extremity Assessment ?Upper Extremity Assessment: Defer to OT evaluation ?  ? ?Lower Extremity Assessment ?Lower Extremity Assessment: RLE deficits/detail ?RLE Deficits / Details: Sensation intact and reportedly normal throughout leg (was impaired at foot prior to surgery); hip flexion strength limited due to pain from incision site ?RLE Sensation: WNL ?  ? ?Cervical / Trunk Assessment ?Cervical / Trunk Assessment: Normal  ?Communication  ? Communication: No difficulties  ?Cognition Arousal/Alertness: Awake/alert ?Behavior During Therapy: [facility] for tasks assessed/performed ?Overall Cognitive Status: Within Functional Limits for tasks assessed ?  ?  ?  ?  ?  ?  ?  ?  ?  ?  ?  ?  ?  ?  ?  ?  ?General Comments: Alert and participatory ?  ?  ? ?  ?General Comments General comments (skin integrity, edema, etc.): Educated pt on progressing mobility and mobilizing with staff and to use RW for pain management. Educated pt on unhooking and hooking back up lines for mobility within room. He and wife verbalized understanding. ? ?  ?Exercises    ? ?Assessment/Plan  ?  ?PT Assessment Patient does not need any further PT services  ?PT Problem List   ? ?   ?  ?PT Treatment Interventions     ? ?PT Goals (Current goals can be found in the Care Plan section)  ?Acute Rehab PT Goals ?Patient Stated Goal: to return to baseline ?PT Goal  Formulation: All assessment and education complete, DC therapy ?Time For Goal Achievement: 10/04/21 ?Potential to Achieve Goals: Good ? ?  ?Frequency   ?  ? ? ?Co-evaluation   ?  ?  ?  ?  ? ? ?  ?AM-PAC PT "6 Clicks" Mobility  ?Outcome Measure Help needed turning from your back to your side while in a flat bed without using bedrails?: None ?Help needed moving from lying on your back to sitting on the side of a flat bed without using bedrails?: None ?Help needed moving to and from a bed to a chair (including a wheelchair)?: None ?Help needed standing up from a chair using your arms (e.g.,  wheelchair or bedside chair)?: None ?Help needed to walk in hospital room?: None ?Help needed climbing 3-5 steps with a railing? : A Little ?6 Click Score: 23 ? ?  ?End of Session   ?Activity Tolerance: Pati

## 2021-10-03 NOTE — Progress Notes (Signed)
?  Transition of Care (TOC) Screening Note ? ? ?Patient Details  ?Name: Jeremy Nicholson ?Date of Birth: 02/05/1946 ? ? ?Transition of Care (TOC) CM/SW Contact:    ?Dahlia Client, Romeo Rabon, RN ?Phone Number: ?10/03/2021, 10:01 AM ? ? ? ?Transition of Care Department [facility]) has reviewed patient and no TOC needs have been identified at this time. We will continue to monitor patient advancement through interdisciplinary progression rounds. If new patient transition needs arise, please place a TOC consult. ?  ?

## 2021-10-04 LAB — GLUCOSE, CAPILLARY
Glucose-Capillary: 149 mg/dL — ABNORMAL HIGH (ref 70–99)
Glucose-Capillary: 178 mg/dL — ABNORMAL HIGH (ref 70–99)

## 2021-10-04 MED ORDER — OXYCODONE-ACETAMINOPHEN 5-325 MG PO TABS
1.0000 | ORAL_TABLET | Freq: Four times a day (QID) | ORAL | 0 refills | Status: DC | PRN
Start: 2021-10-04 — End: 2022-06-10

## 2021-10-04 NOTE — Discharge Instructions (Signed)
 [facility]  Discharge instructions  Lower Extremity Bypass Surgery  Please refer to the following instruction for your post-procedure care. Your surgeon or physician assistant will discuss any changes with you.  Activity  You are encouraged to walk as much as you can. You can slowly return to normal activities during the month after your surgery. Avoid strenuous activity and heavy lifting until your doctor tells you it's OK. Avoid activities such as vacuuming or swinging a golf club. Do not drive until your doctor give the OK and you are no longer taking prescription pain medications. It is also normal to have difficulty with sleep habits, eating and bowel movement after surgery. These will go away with time.  Bathing/Showering  You may shower after you go home. Do not soak in a bathtub, hot tub, or swim until the incision heals completely.  Incision Care  Clean your incision with mild soap and water. Shower every day. Pat the area dry with a clean towel. You do not need a bandage unless otherwise instructed. Do not apply any ointments or creams to your incision. If you have open wounds you will be instructed how to care for them or a visiting nurse may be arranged for you. If you have staples or sutures along your incision they will be removed at your post-op appointment. You may have skin glue on your incision. Do not peel it off. It will come off on its own in about one week. If you have a great deal of moisture in your groin, use a gauze help keep this area dry.  Diet  Resume your normal diet. There are no special food restrictions following this procedure. A low fat/ low cholesterol diet is recommended for all patients with vascular disease. In order to heal from your surgery, it is CRITICAL to get adequate nutrition. Your body requires vitamins, minerals, and protein. Vegetables are the best source of vitamins and minerals. Vegetables also provide the  perfect balance of protein. Processed food has little nutritional value, so try to avoid this.  Medications  Resume taking all your medications unless your doctor or nurse practitioner tells you not to. If your incision is causing pain, you may take over-the-counter pain relievers such as acetaminophen (Tylenol). If you were prescribed a stronger pain medication, please aware these medication can cause nausea and constipation. Prevent nausea by taking the medication with a snack or meal. Avoid constipation by drinking plenty of fluids and eating foods with high amount of fiber, such as fruits, vegetables, and grains. Take Colase 100 mg (an over-the-counter stool softener) twice a day as needed for constipation. Do not take Tylenol if you are taking prescription pain medications.  Follow Up  Our office will schedule a follow up appointment 2-3 weeks following discharge.  Please call us immediately for any of the following conditions  Severe or worsening pain in your legs or feet while at rest or while walking Increase pain, redness, warmth, or drainage (pus) from your incision site(s) Fever of 101 degree or higher The swelling in your leg with the bypass suddenly worsens and becomes more painful than when you were in the hospital If you have been instructed to feel your graft pulse then you should do so every day. If you can no longer feel this pulse, call the office immediately. Not all patients are given this instruction.  Leg swelling is common after leg bypass surgery.  The swelling should improve over a few months   following surgery. To improve the swelling, you may elevate your legs above the level of your heart while you are sitting or resting. Your surgeon or physician assistant may ask you to apply an ACE wrap or wear compression (TED) stockings to help to reduce swelling.  Reduce your risk of vascular disease  Stop smoking. If you would like help call [facility] at 1-800-QUIT-NOW  (1-800-784-8669) or [place] at 336-586-4000.  Manage your cholesterol Maintain a desired weight Control your diabetes weight Control your diabetes Keep your blood pressure down  If you have any questions, please call the office at [phone]   

## 2021-10-04 NOTE — Progress Notes (Addendum)
?  Progress Note ? ? ? ?10/04/2021 ?8:37 AM ?2 Days Post-Op ? ?Subjective:  ready for discharge home ? ? ?Vitals:  ? 10/03/21 2348 10/04/21 0442  ?BP: 125/65 109/67  ?Pulse: 95 99  ?Resp: 16 17  ?Temp: 98.8 ?F (37.1 ?C) 98.7 ?F (37.1 ?C)  ?SpO2: 93% 96%  ? ?Physical Exam: ?Lungs:  non labored ?Incisions:  R groin incision c/d/i ?Extremities:  R foot warm and well perfused ?Neurologic: a&O ? ?CBC ?   ?Component Value Date/Time  ? WBC 6.1 10/03/2021 0344  ? RBC 4.01 (L) 10/03/2021 0344  ? HGB 12.2 (L) 10/03/2021 0344  ? HCT 35.7 (L) 10/03/2021 0344  ? PLT 173 10/03/2021 0344  ? MCV 89.0 10/03/2021 0344  ? MCH 30.4 10/03/2021 0344  ? MCHC 34.2 10/03/2021 0344  ? RDW 13.3 10/03/2021 0344  ? LYMPHSABS 1.3 05/31/2017 0527  ? MONOABS 1.7 (H) 05/31/2017 0527  ? EOSABS 0.1 05/31/2017 0527  ? BASOSABS 0.0 05/31/2017 0527  ? ? ?BMET ?   ?Component Value Date/Time  ? NA 137 10/03/2021 0344  ? K 3.6 10/03/2021 0344  ? CL 110 10/03/2021 0344  ? CO2 22 10/03/2021 0344  ? GLUCOSE 119 (H) 10/03/2021 0344  ? BUN 12 10/03/2021 0344  ? CREATININE 1.30 (H) 10/03/2021 0344  ? CALCIUM 8.3 (L) 10/03/2021 0344  ? GFRNONAA 58 (L) 10/03/2021 0344  ? GFRAA >60 02/03/2018 0834  ? ? ?INR ?   ?Component Value Date/Time  ? INR 1.0 09/29/2021 0920  ? ? ?No intake or output data in the 24 hours ending 10/04/21 0837 ? ? ?Assessment/Plan:  76 y.o. male is s/p R iliofemoral endarterectomy and EIA stent 2 Days Post-Op  ? ?R foot well perfused; subjectively better than pre-op ?Continue DAPT ?D/c home today; office will arrange incision check in a few weeks ? ? ?Dagoberto Ligas, PA-C ?[facility] ?[phone] ?10/04/2021 ?8:37 AM ? ?I agree with the above.  Have seen and evaluated the patient.  He is very pleased with the way his right leg feels.  His incision is healing nicely.  He is stable for discharge ? ?Jeremy Nicholson ? ?

## 2021-10-04 NOTE — Progress Notes (Signed)
D/c tele and Ivs. Went over AVS with pt and all questions were addressed.  Emmamae Mcnamara S Coco Sharpnack, RN  

## 2021-10-05 NOTE — Discharge Summary (Signed)
?Discharge Summary ? ?Patient ID: ?Jeremy Nicholson ?[id number] ?76 y.o. ?[birth year] ? ?Admit date: 10/02/2021 ? ?Discharge date and time: 10/04/2021 11:51 AM  ? ?Admitting Physician: [name], MD  ? ?Discharge Physician: Dr. Trula Slade ? ?Admission Diagnoses: PAD (peripheral artery disease) ([place]) [I73.9] ? ?Discharge Diagnoses: same ? ?Admission Condition: fair ? ?Discharged Condition: fair ? ?Indication for Admission: Postoperative care ? ?Hospital Course: Jeremy Nicholson is a 76 year old male who was brought in as an outpatient and underwent right external iliac artery stenting with iliofemoral endarterectomy and bovine pericardial patch angioplasty by Dr. Virl Cagey on 10/02/2021.  He tolerated the procedure well and was admitted to the hospital postoperatively.  His hospital stay was uneventful and consisted of postoperative pain control and increasing mobility.  On POD #2 right groin incision was well-appearing and right foot was well-perfused on exam.  He will follow-up in the office in 2 to 3 weeks for incision check.  He will be prescribed 1 to 2 days of narcotic pain medication for postoperative pain control.  He was discharged home in stable condition. ? ?Consults: None ? ?Treatments: surgery: Right external iliac artery stenting with iliofemoral endarterectomy and bovine pericardial patch angioplasty by Dr. Virl Cagey on 10/02/2021 ? ?Discharge Exam: See progress note 10/04/21 ?Vitals:  ? 10/04/21 0923 10/04/21 1054  ?BP: 135/69 135/71  ?Pulse: (!) 101 (!) 101  ?Resp: 14 15  ?Temp: 98.8 ?F (37.1 ?C) 98.5 ?F (36.9 ?C)  ?SpO2: 95% 97%  ? ? ? ?Disposition: Discharge disposition: 01-Home or Self Care ? ? ? ? ? ? ?Patient Instructions:  ?Allergies as of 10/04/2021   ? ?   Reactions  ? Atorvastatin Other (See Comments)  ? Muscle pain  ? Chlorthalidone Other (See Comments)  ? Pt unsure of reaction  ? Pravastatin Other (See Comments)  ? Muscle pain  ? Latex Hives  ? Lisinopril Hives  ? Rosuvastatin Other (See Comments)  ?  Muscle pain  ? Tape Hives  ? Latex tape  ? ?  ? ?  ?Medication List  ?  ? ?TAKE these medications   ? ?acetaminophen 500 MG tablet ?Commonly known as: TYLENOL ?Take 1,000 mg by mouth every 6 (six) hours as needed for moderate pain. ?  ?amLODipine 10 MG tablet ?Commonly known as: NORVASC ?Take 10 mg by mouth every morning. ?  ?aspirin 81 MG chewable tablet ?Chew 81 mg by mouth daily. ?  ?atorvastatin 80 MG tablet ?Commonly known as: LIPITOR ?Take 80 mg by mouth at bedtime. ?  ?Brinzolamide-Brimonidine 1-0.2 % Susp ?Place 1 drop into both eyes 2 (two) times daily. ?  ?cetirizine 10 MG tablet ?Commonly known as: ZYRTEC ?Take 10 mg by mouth daily. ?  ?cholecalciferol 25 MCG (1000 UNIT) tablet ?Commonly known as: VITAMIN D3 ?Take 1,000 Units by mouth daily. ?  ?finasteride 5 MG tablet ?Commonly known as: PROSCAR ?Take 5 mg by mouth every morning. ?  ?furosemide 40 MG tablet ?Commonly known as: LASIX ?Take 40 mg by mouth every morning. ?  ?insulin glargine 100 UNIT/ML injection ?Commonly known as: LANTUS ?Inject 25 Units into the skin at bedtime. ?  ?ipratropium 0.06 % nasal spray ?Commonly known as: ATROVENT ?Place 1 spray into both nostrils in the morning and at bedtime. ?  ?latanoprost 0.005 % ophthalmic solution ?Commonly known as: XALATAN ?Place 1 drop into both eyes at bedtime. ?  ?lisinopril 10 MG tablet ?Commonly known as: ZESTRIL ?Take 10 mg by mouth daily. ?  ?metFORMIN 500 MG tablet ?Commonly known as:  GLUCOPHAGE ?Take 500 mg by mouth 2 (two) times daily with a meal. ?  ?oxyCODONE-acetaminophen 5-325 MG tablet ?Commonly known as: PERCOCET/ROXICET ?Take 1 tablet by mouth every 6 (six) hours as needed for moderate pain. ?  ?potassium chloride SA 20 MEQ tablet ?Commonly known as: KLOR-CON M ?Take 20 mEq by mouth daily. ?  ?Semaglutide(0.25 or 0.'5MG'$ /DOS) 2 MG/1.5ML Sopn ?Inject 0.5 mg into the skin every Monday. ?  ?tamsulosin 0.4 MG Caps capsule ?Commonly known as: FLOMAX ?Take 0.4 mg by mouth at bedtime. ?   ?triamcinolone 55 MCG/ACT Aero nasal inhaler ?Commonly known as: NASACORT ?Place 1 spray into the nose 2 (two) times daily. ?  ? ?  ? ?Activity: activity as tolerated ?Diet: regular diet ?Wound Care: keep wound clean and dry ? ?Follow-up with [facility] in 2 weeks. ? ?Signed: ?Dagoberto Ligas, PA-C ?10/05/2021 ?10:15 AM ?[facility] Office: [phone] ? ?

## 2021-10-06 ENCOUNTER — Encounter (HOSPITAL_COMMUNITY): Payer: Self-pay | Admitting: Vascular Surgery

## 2021-10-23 NOTE — Progress Notes (Signed)
Office Note    HPI: Jeremy Nicholson is a 76 y.o. ([birth year]) male presenting in follow-up status post 10/02/2021 right external iliac artery stenting, iliofemoral endarterectomy extending on the profunda, patched using bovine pericardium.  On exam today, Jeremy Nicholson was doing well.  He stated his rest pain in the right leg had resolved. He has had a normal diet, and improved energy level. He is worried about some redness appreciated at the incision site.   Jeremy Nicholson is a disabled veteran, and big [employer] fan from his time in [place].  The pt is  on a statin for cholesterol management.  The pt is  on a daily aspirin.   Other AC:  Plavix The pt is  on medication for hypertension.   The pt is  diabetic.  Tobacco hx:  -  Past Medical History:  Diagnosis Date   Aortic valve stenosis, moderate    per echo 06-02-2017 (in epic)  ef 55-60%,  possible bicuspid AV,  severeely thickened and calcified with severely restricted leaflet opening,  valve area 1.17cm^2, mean gradient 67mHg, peak gradient 550mg,  moderate AR   Arthritis    oa   BPH associated with nocturia    Cancer (HCC)    prostate cancer   Complication of anesthesia    due to heart murmur  mod to severe aortic stenosis   Coronary artery disease    Full dentures    Glaucoma, both eyes    Heart murmur, systolic    Hiatal hernia    History of kidney stones    Hypertension    Left ureteral stone    [facility]ocardial infarction [facility]   [facility] [place] [facility]   Peripheral vascular disease ([facility]   SIRS (systemic inflammatory response syndrome) ([facility]/2018   hospital admission--  unknown etiology   Type 2 diabetes mellitus ([facility]   Wears glasses     Past Surgical History:  Procedure Laterality Date   COLONOSCOPY     CORONARY ARTERY BYPASS GRAFT     pt reports Aortic valve replacement and CABG in 2020   [place]AC[place] Left 02/04/2018    Procedure: CYSTOSCOPY WITH RETROGRADE PYELOGRAM, URETEROSCOPY AND STENT [place] Surgeon: MaAlexis FrockMD;  [facility];  Service: Urology;  Laterality: Left;   ENDARTERECTOMY FEMORAL Right 10/02/2021   Procedure: RIGHT COMMON FEMORAL ENDARTERECTOMY WITH 1CM X 14CM XEWeyman Pedro Surgeon: RoBroadus JohnMD;  [facility] Service: [facility];  Laterality: Right;   EXAM UNDER ANESTHESIA WITH MANIPULATION OF KNEE Left 09/30/2017   Procedure: EXAM UNDER ANESTHESIA WITH MANIPULATION OF KNEE;  Surgeon: CoSydnee CabalMD;  [facility];  Service: Orthopedics;  Laterality: Left;  30 mins   INSERTION OF ILIAC STENT Right 10/02/2021   Procedure: INSERTION OF RIGHT ILIAC STENT;  Surgeon: RoBroadus JohnMD;  [facility] Service: Vascular;  Laterality: Right;   PATCH ANGIOPLASTY Right 10/02/2021   Procedure: PATCH ANGIOPLASTY;  Surgeon: RoBroadus JohnMD;  [facility];  Service: Vascular;  Laterality: Right;   PROSTATE BIOPSY  07/2017   cancerous polyps x2  watching psa dr maTresa Mooret alLeaf Rivereft 05/28/2017   Procedure: LEFT TOTAL KNEE ARTHROPLASTY;  Surgeon: CoSydnee CabalMD;  [facility];  Service: Orthopedics;  Laterality: Left;   TRANSTHORACIC ECHOCARDIOGRAM  06/02/2017   ef 55-60%/  possible AV bicuspid,  severely thickened and calcified leaflets with severely restricted leaflet opening, moderate regurg.(valve area 1.17cm^2,  mean gradient 36mHg, peak gradient 562mg)/  mild LAE/  mild TR/  mild MV thickened leaflets and regurg. (not restricted and no stenosis    Social History   Socioeconomic History   Marital status: Married    Spouse name: Not on file   Number of children: Not on file   Years of education: Not on file   Highest education level: Not on file  Occupational History   Not on file  Tobacco Use   Smoking status: Former    Years: 30.00    Types: Cigarettes    Quit date: 01/28/1995    Years since  quitting: 26.7   Smokeless tobacco: Never  Vaping Use   Vaping Use: Never used  Substance and Sexual Activity   Alcohol use: No   Drug use: No   Sexual activity: Not Currently  Other Topics Concern   Not on file  Social History Narrative   Not on file   Social Determinants of Health   Financial Resource Strain: Not on file  Food Insecurity: Not on file  Transportation Needs: Not on file  Physical Activity: Not on file  Stress: Not on file  Social Connections: Not on file  Intimate Partner Violence: Not on file   No family history on file.  Current Outpatient Medications  Medication Sig Dispense Refill   acetaminophen (TYLENOL) 500 MG tablet Take 1,000 mg by mouth every 6 (six) hours as needed for moderate pain.      amLODipine (NORVASC) 10 MG tablet Take 10 mg by mouth every morning.      aspirin 81 MG chewable tablet Chew 81 mg by mouth daily.     atorvastatin (LIPITOR) 80 MG tablet Take 80 mg by mouth at bedtime.  2   Brinzolamide-Brimonidine 1-0.2 % SUSP Place 1 drop into both eyes 2 (two) times daily.      cetirizine (ZYRTEC) 10 MG tablet Take 10 mg by mouth daily.     cholecalciferol (VITAMIN D3) 25 MCG (1000 UNIT) tablet Take 1,000 Units by mouth daily.     finasteride (PROSCAR) 5 MG tablet Take 5 mg by mouth every morning.   3   furosemide (LASIX) 40 MG tablet Take 40 mg by mouth every morning.     insulin glargine (LANTUS) 100 UNIT/ML injection Inject 25 Units into the skin at bedtime.     ipratropium (ATROVENT) 0.06 % nasal spray Place 1 spray into both nostrils in the morning and at bedtime.     latanoprost (XALATAN) 0.005 % ophthalmic solution Place 1 drop into both eyes at bedtime.      lisinopril (ZESTRIL) 10 MG tablet Take 10 mg by mouth daily.     metFORMIN (GLUCOPHAGE) 500 MG tablet Take 500 mg by mouth 2 (two) times daily with a meal.     oxyCODONE-acetaminophen (PERCOCET/ROXICET) 5-325 MG tablet Take 1 tablet by mouth every 6 (six) hours as needed for  moderate pain. 20 tablet 0   potassium chloride SA (KLOR-CON M) 20 MEQ tablet Take 20 mEq by mouth daily.     Semaglutide,0.25 or 0.'5MG'$ /DOS, 2 MG/1.5ML SOPN Inject 0.5 mg into the skin every Monday.     tamsulosin (FLOMAX) 0.4 MG CAPS capsule Take 0.4 mg by mouth at bedtime.      triamcinolone (NASACORT) 55 MCG/ACT AERO nasal inhaler Place 1 spray into the nose 2 (two) times daily.     No current  facility-administered medications for this visit.    Allergies  Allergen Reactions   Atorvastatin Other (See Comments)    Muscle pain   Chlorthalidone Other (See Comments)    Pt unsure of reaction   Pravastatin Other (See Comments)    Muscle pain   Latex Hives   Lisinopril Hives   Rosuvastatin Other (See Comments)    Muscle pain   Tape Hives    Latex tape     REVIEW OF SYSTEMS:   '[X]'$  denotes positive finding, '[ ]'$  denotes negative finding Cardiac  Comments:  Chest pain or chest pressure:    Shortness of breath upon exertion:    Short of breath when lying flat:    Irregular heart rhythm:        Vascular    Pain in calf, thigh, or hip brought on by ambulation: X   Pain in feet at night that wakes you up from your sleep:     Blood clot in your veins:    Leg swelling:         Pulmonary    Oxygen at home:    Productive cough:     Wheezing:         Neurologic    Sudden weakness in arms or legs:     Sudden numbness in arms or legs:     Sudden onset of difficulty speaking or slurred speech:    Temporary loss of vision in one eye:     Problems with dizziness:         Gastrointestinal    Blood in stool:     Vomited blood:         Genitourinary    Burning when urinating:     Blood in urine:        Psychiatric    Major depression:         Hematologic    Bleeding problems:    Problems with blood clotting too easily:        Skin    Rashes or ulcers:        Constitutional    Fever or chills:      PHYSICAL EXAMINATION:  There were no vitals filed for this  visit.   General:  WDWN in NAD; vital signs documented above Gait: Not observed HENT: WNL, normocephalic Pulmonary: normal non-labored breathing , without wheezing Cardiac: regular HR Abdomen: soft, NT, no masses Skin: without rashes Vascular Exam/Pulses:  Right Left  Radial 2+ (normal) 2+ (normal)  Ulnar 2+ (normal) 2+ (normal)  Femoral absent 1+ (weak)  Popliteal absent absent  DP absent absent  PT multiphasic absent   Extremities: without ischemic changes, without Gangrene , Superficial dehiscence of the lateral aspect of the oblique groin incision.  Depth 5 mm  Musculoskeletal: no muscle wasting or atrophy  Neurologic: A&O X 3;  No focal weakness or paresthesias are detected Psychiatric:  The pt has Normal affect.   Non-Invasive Vascular Imaging:   -    ASSESSMENT/PLAN: Erik Burkett is a 76 y.o. male presenting in follow-up status post 10/02/2021 right external iliac artery stenting, iliofemoral endarterectomy extending on the profunda, patched using bovine pericardium.  Overall I am happy with his progress, the fact that his rest pain has subsided he does have some superficial dehiscence of his oblique right groin incision.  Nonviable tissue was debrided at the level of the skin.  We discussed the importance of keeping this area dry.  I asked that they stop using Neosporin.  The  wound bed appeared healthy with no major concerns.  I will see him back in 2 weeks for wound check.  I have also sent a 7-day prescription for Augmentin for some superficial erythema appreciated at the incision.   [name], MD [facility] [phone] Greater than 30 minutes was spent in precharting, clinic visit, post charting

## 2021-10-24 ENCOUNTER — Ambulatory Visit (INDEPENDENT_AMBULATORY_CARE_PROVIDER_SITE_OTHER): Payer: Medicare HMO | Admitting: Vascular Surgery

## 2021-10-24 ENCOUNTER — Encounter: Payer: Self-pay | Admitting: Vascular Surgery

## 2021-10-24 VITALS — BP 128/69 | HR 79 | Temp 98.0°F | Resp 20 | Ht 69.0 in | Wt 184.0 lb

## 2021-10-24 DIAGNOSIS — Z95828 Presence of other vascular implants and grafts: Secondary | ICD-10-CM

## 2021-10-24 DIAGNOSIS — I70221 Atherosclerosis of native arteries of extremities with rest pain, right leg: Secondary | ICD-10-CM

## 2021-10-24 MED ORDER — AMOXICILLIN-POT CLAVULANATE 500-125 MG PO TABS: 1.0000 | ORAL_TABLET | Freq: Three times a day (TID) | ORAL | 0 refills | Status: AC

## 2021-11-03 DIAGNOSIS — N5201 Erectile dysfunction due to arterial insufficiency: Secondary | ICD-10-CM | POA: Diagnosis not present

## 2021-11-03 DIAGNOSIS — C61 Malignant neoplasm of prostate: Secondary | ICD-10-CM | POA: Diagnosis not present

## 2021-11-03 DIAGNOSIS — R338 Other retention of urine: Secondary | ICD-10-CM | POA: Diagnosis not present

## 2021-11-04 NOTE — Progress Notes (Signed)
POST OPERATIVE OFFICE NOTE    CC:  F/u for surgery  HPI:  This is a 76 y.o. male who is s/p right external iliac artery stenting, iliofemoral endarterectomy extending on the profunda, patched using bovine pericardium on 10/02/2021 by Dr. Virl Cagey.  Pt was seen on 10/24/2021 and at that time, his rest pain had resolved.  He was concerned about some redness at the incision site.  He did have some superficial dehiscence of his right groin incision and non viable tissue was debrided at the level of the skin.  The wound bed was healthy with no major concerns.  He was given 7 day rx for Augementin and f/u in 2 weeks.      Pt returns today for follow up with his wife of 12 years.   Pt states there is some irritation around the incision with his underwear but otherwise it is healing.  He is continuing to paint it with betadine and use dry gauze.  He pre operative rest pain on the right leg is resolved.  He does have some ankle swelling on the right.  He does follow with Dr. Posey Pronto with podiatry.   Allergies  Allergen Reactions   Atorvastatin Other (See Comments)    Muscle pain   Chlorthalidone Other (See Comments)    Pt unsure of reaction   Pravastatin Other (See Comments)    Muscle pain   Latex Hives   Lisinopril Hives   Rosuvastatin Other (See Comments)    Muscle pain   Tape Hives    Latex tape    Current Outpatient Medications  Medication Sig Dispense Refill   acetaminophen (TYLENOL) 500 MG tablet Take 1,000 mg by mouth every 6 (six) hours as needed for moderate pain.      amLODipine (NORVASC) 10 MG tablet Take 10 mg by mouth every morning.      aspirin 81 MG chewable tablet Chew 81 mg by mouth daily.     atorvastatin (LIPITOR) 80 MG tablet Take 80 mg by mouth at bedtime.  2   Brinzolamide-Brimonidine 1-0.2 % SUSP Place 1 drop into both eyes 2 (two) times daily.      cetirizine (ZYRTEC) 10 MG tablet Take 10 mg by mouth daily.     cholecalciferol (VITAMIN D3) 25 MCG (1000 UNIT) tablet Take  1,000 Units by mouth daily.     finasteride (PROSCAR) 5 MG tablet Take 5 mg by mouth every morning.   3   furosemide (LASIX) 40 MG tablet Take 40 mg by mouth every morning.     insulin glargine (LANTUS) 100 UNIT/ML injection Inject 25 Units into the skin at bedtime.     ipratropium (ATROVENT) 0.06 % nasal spray Place 1 spray into both nostrils in the morning and at bedtime.     latanoprost (XALATAN) 0.005 % ophthalmic solution Place 1 drop into both eyes at bedtime.      lisinopril (ZESTRIL) 10 MG tablet Take 10 mg by mouth daily.     metFORMIN (GLUCOPHAGE) 500 MG tablet Take 500 mg by mouth 2 (two) times daily with a meal.     oxyCODONE-acetaminophen (PERCOCET/ROXICET) 5-325 MG tablet Take 1 tablet by mouth every 6 (six) hours as needed for moderate pain. 20 tablet 0   potassium chloride SA (KLOR-CON M) 20 MEQ tablet Take 20 mEq by mouth daily.     Semaglutide,0.25 or 0.'5MG'$ /DOS, 2 MG/1.5ML SOPN Inject 0.5 mg into the skin every Monday.     tamsulosin (FLOMAX) 0.4 MG CAPS capsule Take  0.4 mg by mouth at bedtime.      triamcinolone (NASACORT) 55 MCG/ACT AERO nasal inhaler Place 1 spray into the nose 2 (two) times daily.     No current facility-administered medications for this visit.     ROS:  See HPI  Physical Exam:  Today's Vitals   11/07/21 0917  BP: 114/72  Pulse: 67  Resp: 20  Temp: 98.6 F (37 C)  TempSrc: Temporal  SpO2: 100%  Weight: 182 lb 8 oz (82.8 kg)  Height: '5\' 9"'$  (1.753 m)  PainSc: 5   PainLoc: Leg   Body mass index is 26.95 kg/m.   Incision:  right groin incision is healing.     Extremities:  brisk monophasic right PT and monophasic peroneal and left monophasic PT/pero.  Mild swelling right foot/ankle. No wounds present.      Assessment/Plan:  This is a 76 y.o. male who is s/p: right external iliac artery stenting, iliofemoral endarterectomy extending on the profunda, patched using bovine pericardium on 10/02/2021 by Dr. Virl Cagey.  -pt doing well as pre  operative rest pain has resolved.  He does have brisk monophasic doppler flow in the right PT.  His left leg remains asymptomatic.   -he does have some mild right foot/ankle swelling, which was present prior to surgery.  He may has some venous insufficiency as well as reperfusion swelling.  Advised him to elevate his leg as he can tolerate.   -he will continue graduated walking program.  Discussed with he and his wife that his ABI on the left is 73%.  Given he is asymptomatic, we would not do any intervention for this unless he has symptoms.   -he will f/u in 3 months with ABI.  He knows to call sooner if he develops rest pain or non healing wounds.  I discussed with him to protect his feet. -continue asa/statin.   Leontine Locket, [facility] [phone]   Clinic MD:  Virl Cagey

## 2021-11-07 ENCOUNTER — Encounter: Payer: Self-pay | Admitting: Physician Assistant

## 2021-11-07 ENCOUNTER — Ambulatory Visit: Payer: Medicare HMO | Admitting: Physician Assistant

## 2021-11-07 VITALS — BP 114/72 | HR 67 | Temp 98.6°F | Resp 20 | Ht 69.0 in | Wt 182.5 lb

## 2021-11-07 DIAGNOSIS — I739 Peripheral vascular disease, unspecified: Secondary | ICD-10-CM

## 2021-11-10 DIAGNOSIS — J309 Allergic rhinitis, unspecified: Secondary | ICD-10-CM | POA: Diagnosis not present

## 2021-11-12 ENCOUNTER — Other Ambulatory Visit: Payer: Self-pay | Admitting: *Deleted

## 2021-11-12 DIAGNOSIS — Z95828 Presence of other vascular implants and grafts: Secondary | ICD-10-CM

## 2021-11-12 DIAGNOSIS — I739 Peripheral vascular disease, unspecified: Secondary | ICD-10-CM

## 2021-12-06 ENCOUNTER — Other Ambulatory Visit: Payer: Self-pay

## 2021-12-06 ENCOUNTER — Ambulatory Visit
Admission: EM | Admit: 2021-12-06 | Discharge: 2021-12-06 | Disposition: A | Discharge status: Home / Self Care | Payer: Medicare HMO | Attending: Neonatology | Admitting: Neonatology

## 2021-12-06 ENCOUNTER — Encounter: Payer: Self-pay | Admitting: Emergency Medicine

## 2021-12-06 DIAGNOSIS — Z1152 Encounter for screening for COVID-19: Secondary | ICD-10-CM | POA: Insufficient documentation

## 2021-12-06 DIAGNOSIS — J029 Acute pharyngitis, unspecified: Secondary | ICD-10-CM | POA: Insufficient documentation

## 2021-12-06 LAB — POCT RAPID STREP A (OFFICE): Rapid Strep A Screen: NEGATIVE

## 2021-12-06 NOTE — ED Triage Notes (Signed)
Pt here requesting strep test as wife has sx

## 2021-12-06 NOTE — Discharge Instructions (Signed)
COVID testing ordered.  It will take between 1-2 days for test results.  Someone will contact you regarding abnormal results.    Get plenty of rest and push fluids Use OTC zyrtec for nasal congestion, runny nose, and/or sore throat Use OTC flonase for nasal congestion and runny nose Use medications daily for symptom relief Use OTC medications like ibuprofen or tylenol as needed fever or pain Call or go to the ED if you have any new or worsening symptoms such as fever, worsening cough, shortness of breath, chest tightness, chest pain, turning blue, changes in mental status, etc..Marland Kitchen

## 2021-12-06 NOTE — ED Provider Notes (Signed)
[place]   [id number] 12/06/21 Arrival Time: 65   Chief Complaint  Patient presents with   Sore Throat     SUBJECTIVE: History from: patient and family.  Donielle Radziewicz is a 76 y.o. male w who presented to the urgent care for complaint of requesting a strep test as wife with sore throat, and URI symptoms.  Denies sick exposure to COVID, flu or strep.  Denies recent travel.   Denies any alleviating or aggravating factors.  Denies previous symptoms in the past.   Denies fever, chills, fatigue, sinus pain, rhinorrhea, sore throat, SOB, wheezing, chest pain, nausea, changes in bowel or bladder habits.     ROS: As per HPI.  All other pertinent ROS negative.     Past Medical History:  Diagnosis Date   Aortic valve stenosis, moderate    per echo 06-02-2017 (in epic)  ef 55-60%,  possible bicuspid AV,  severeely thickened and calcified with severely restricted leaflet opening,  valve area 1.17cm^2, mean gradient 3mHg, peak gradient 535mg,  moderate AR   Arthritis    oa   BPH associated with nocturia    Cancer (HCC)    prostate cancer   Complication of anesthesia    due to heart murmur  mod to severe aortic stenosis   Coronary artery disease    Full dentures    Glaucoma, both eyes    Heart murmur, systolic    Hiatal hernia    History of kidney stones    Hypertension    Left ureteral stone    [place] [facility]   [facility] [place] [facility]   Peripheral vascular disease ([facility]   SIRS (systemic inflammatory response syndrome) ([place]12/31/2018   hospital admission--  unknown etiology   Type 2 diabetes mellitus ([facility]   Wears glasses    Past Surgical History:  Procedure Laterality Date   COLONOSCOPY     CORONARY ARTERY BYPASS GRAFT     pt reports Aortic valve replacement and CABG in 2020   [place] 02/[place]/2018   Procedure: CYSTOSCOPY WITH RETROGRADE PYELOGRAM,  URETEROSCOPY AND STENT EXHes[place] Surgeon: MaAlexis FrockMD;  [facility];  Service: Urology;  Laterality: Left;   ENDARTERECTOMY FEMORAL Right 10/02/2021   Procedure: RIGHT COMMON FEMORAL ENDARTERECTOMY WITH 1CM X 14CM XEWeyman Pedro Surgeon: RoBroadus JohnMD;  [place] [facility] Service: Vascular;  Laterality: Right;   EXAM UNDER ANESTHESIA WITH MANIPULATION OF KNEE Left 09/30/2017   Procedure: EXAM UNDER ANESTHESIA WITH MANIPULATION OF KNEE;  Surgeon: CoSydnee CabalMD;  [facility];  Service: Orthopedics;  Laterality: Left;  30 mins   INSERTION OF ILIAC STENT Right 10/02/2021   Procedure: INSERTION OF RIGHT ILIAC STENT;  Surgeon: RoBroadus JohnMD;  [place] [facility] Service: Vascular;  Laterality: Right;   PATCH ANGIOPLASTY Right 10/02/2021   Procedure: PATCH ANGIOPLASTY;  Surgeon: RoBroadus JohnMD;  [facility];  Service: Vascular;  Laterality: Right;   PROSTATE BIOPSY  07/2017   cancerous polyps x2  watching psa dr maTresa Mooret [place] 05/28/2017   Procedure: LEFT TOTAL KNEE ARTHROPLASTY;  Surgeon: CoSydnee CabalMD;  [facility];  Service: Orthopedics;  Laterality: Left;   TRANSTHORACIC ECHOCARDIOGRAM  06/02/2017   ef 55-60%/  possible AV bicuspid,  severely thickened and calcified leaflets with severely restricted leaflet opening,  moderate regurg.(valve area 1.17cm^2,  mean gradient 83mHg, peak gradient 585mg)/  mild LAE/  mild TR/  mild MV thickened leaflets and regurg. (not restricted and no stenosis   Allergies  Allergen Reactions   Atorvastatin Other (See Comments)    Muscle pain   Chlorthalidone Other (See Comments)    Pt unsure of reaction   Pravastatin Other (See Comments)    Muscle pain   Latex Hives   Lisinopril Hives   Rosuvastatin Other (See Comments)    Muscle pain   Tape Hives    Latex tape   No current facility-administered medications on file prior to encounter.   Current  Outpatient Medications on File Prior to Encounter  Medication Sig Dispense Refill   acetaminophen (TYLENOL) 500 MG tablet Take 1,000 mg by mouth every 6 (six) hours as needed for moderate pain.      amLODipine (NORVASC) 10 MG tablet Take 10 mg by mouth every morning.      aspirin 81 MG chewable tablet Chew 81 mg by mouth daily.     atorvastatin (LIPITOR) 80 MG tablet Take 80 mg by mouth at bedtime.  2   Brinzolamide-Brimonidine 1-0.2 % SUSP Place 1 drop into both eyes 2 (two) times daily.      cetirizine (ZYRTEC) 10 MG tablet Take 10 mg by mouth daily.     cholecalciferol (VITAMIN D3) 25 MCG (1000 UNIT) tablet Take 1,000 Units by mouth daily.     ezetimibe (ZETIA) 10 MG tablet TAKE ONE TABLET BY MOUTH DAILY FOR CHOLESTEROL.     finasteride (PROSCAR) 5 MG tablet Take 5 mg by mouth every morning.   3   furosemide (LASIX) 40 MG tablet Take 40 mg by mouth every morning.     insulin glargine (LANTUS) 100 UNIT/ML injection Inject 25 Units into the skin at bedtime.     insulin glargine-yfgn (SEMGLEE) 100 UNIT/ML Pen INJECT 25 UNITS SUBCUTANEOUSLY DAILY     ipratropium (ATROVENT) 0.06 % nasal spray Place 1 spray into both nostrils in the morning and at bedtime.     latanoprost (XALATAN) 0.005 % ophthalmic solution Place 1 drop into both eyes at bedtime.      lisinopril (ZESTRIL) 10 MG tablet Take 10 mg by mouth daily.     metFORMIN (GLUCOPHAGE) 500 MG tablet Take 500 mg by mouth 2 (two) times daily with a meal.     OMEPRAZOLE PO TAKE ONE CAPSULE BY MOUTH DAILY (TAKE ON AN EMPTY STOMACH 30 MINUTES PRIOR TO A MEAL)     oxyCODONE-acetaminophen (PERCOCET/ROXICET) 5-325 MG tablet Take 1 tablet by mouth every 6 (six) hours as needed for moderate pain. 20 tablet 0   potassium chloride SA (KLOR-CON M) 20 MEQ tablet Take 20 mEq by mouth daily.     Semaglutide,0.25 or 0.'5MG'$ /DOS, 2 MG/1.5ML SOPN Inject 0.5 mg into the skin every Monday.     tamsulosin (FLOMAX) 0.4 MG CAPS capsule Take 0.4 mg by mouth at bedtime.       triamcinolone (NASACORT) 55 MCG/ACT AERO nasal inhaler Place 1 spray into the nose 2 (two) times daily.     Social History   Socioeconomic History   Marital status: Married    Spouse name: Not on file   Number of children: Not on file   Years of education: Not on file   Highest education level: Not on file  Occupational History   Not on file  Tobacco Use   Smoking status: Former    Years: 30.00  Types: Cigarettes    Quit date: 01/28/1995    Years since quitting: 26.8   Smokeless tobacco: Never  Vaping Use   Vaping Use: Never used  Substance and Sexual Activity   Alcohol use: No   Drug use: No   Sexual activity: Not Currently  Other Topics Concern   Not on file  Social History Narrative   Not on file   Social Determinants of Health   Financial Resource Strain: Not on file  Food Insecurity: Not on file  Transportation Needs: Not on file  Physical Activity: Not on file  Stress: Not on file  Social Connections: Not on file  Intimate Partner Violence: Not on file   History reviewed. No pertinent family history.  OBJECTIVE:  Vitals:   12/06/21 1134  BP: (!) 138/58  Pulse: 68  Resp: 18  Temp: 97.6 F (36.4 C)  TempSrc: Oral  SpO2: 99%     General appearance: alert; appears fatigued, but nontoxic; speaking in full sentences and tolerating own secretions HEENT: NCAT; Ears: EACs clear, TMs pearly gray; Eyes: PERRL.  EOM grossly intact. Sinuses: nontender; Nose: nares patent without rhinorrhea, Throat: oropharynx clear, tonsils non erythematous or enlarged, uvula midline  Neck: supple without LAD Lungs: unlabored respirations, symmetrical air entry; cough: absent; no respiratory distress; CTAB Heart: regular rate and rhythm.  Radial pulses 2+ symmetrical bilaterally Skin: warm and dry Psychological: alert and cooperative; normal mood and affect  LABS:  Results for orders placed or performed during the hospital encounter of 12/06/21 (from the past 24  hour(s))  POCT rapid strep A     Status: None   Collection Time: 12/06/21 11:42 AM  Result Value Ref Range   Rapid Strep A Screen Negative Negative     ASSESSMENT & PLAN:  1. Sore throat   2. Encounter for screening for COVID-19     No orders of the defined types were placed in this encounter.  Discharge instructions   COVID testing ordered.  It will take between 1-2 days for test results.  Someone will contact you regarding abnormal results.    Get plenty of rest and push fluids Use throat lozenges such as Cepacol to soothe your throat Use OTC zyrtec for nasal congestion, runny nose, and/or sore throat Use OTC flonase for nasal congestion and runny nose Use medications daily for symptom relief Use OTC medications like ibuprofen or tylenol as needed fever or pain Call or go to the ED if you have any new or worsening symptoms such as fever, worsening cough, shortness of breath, chest tightness, chest pain, turning blue, changes in mental status, etc...   Reviewed expectations re: course of current medical issues. Questions answered. Outlined signs and symptoms indicating need for more acute intervention. Patient verbalized understanding. After Visit Summary given.          Emerson Monte, FNP 12/06/21 1234

## 2021-12-07 LAB — NOVEL CORONAVIRUS, NAA: SARS-CoV-2, NAA: NOT DETECTED

## 2021-12-09 LAB — CULTURE, GROUP A STREP (THRC)

## 2021-12-10 ENCOUNTER — Ambulatory Visit: Payer: Medicare HMO | Admitting: Podiatry

## 2021-12-10 DIAGNOSIS — I739 Peripheral vascular disease, unspecified: Secondary | ICD-10-CM | POA: Diagnosis not present

## 2021-12-10 DIAGNOSIS — E119 Type 2 diabetes mellitus without complications: Secondary | ICD-10-CM

## 2021-12-10 DIAGNOSIS — I999 Unspecified disorder of circulatory system: Secondary | ICD-10-CM | POA: Diagnosis not present

## 2021-12-12 NOTE — Progress Notes (Signed)
Subjective:  Patient ID: Jeremy Nicholson, male    DOB: 1945-11-19,  MRN: [id number]  Chief Complaint  Patient presents with   Arthritis    76 y.o. male presents with the above complaint.patient presents for follow-up of history of vascular intervention.  He states is doing a lot better he has made his foot feel a lot better.  He denies any other acute complaints.  Review of Systems: Negative except as noted in the HPI. Denies N/V/F/Ch.  Past Medical History:  Diagnosis Date   Aortic valve stenosis, moderate    per echo 06-02-2017 (in epic)  ef 55-60%,  possible bicuspid AV,  severeely thickened and calcified with severely restricted leaflet opening,  valve area 1.17cm^2, mean gradient 30mHg, peak gradient 515mg,  moderate AR   Arthritis    oa   BPH associated with nocturia    Cancer (HCC)    prostate cancer   Complication of anesthesia    due to heart murmur  mod to severe aortic stenosis   Coronary artery disease    Full dentures    Glaucoma, both eyes    Heart murmur, systolic    Hiatal hernia    History of kidney stones    Hypertension    Left ureteral stone    Myocardial infarction ([facility]   [facility] [place] [facility]   Peripheral vascular disease ([facility]   SIRS (systemic inflammatory response syndrome) ([place]12/31/2018   hospital admission--  unknown etiology   Type 2 diabetes mellitus ([place]   Wears glasses     Current Outpatient Medications:    acetaminophen (TYLENOL) 500 MG tablet, Take 1,000 mg by mouth every 6 (six) hours as needed for moderate pain. , Disp: , Rfl:    amLODipine (NORVASC) 10 MG tablet, Take 10 mg by mouth every morning. , Disp: , Rfl:    aspirin 81 MG chewable tablet, Chew 81 mg by mouth daily., Disp: , Rfl:    atorvastatin (LIPITOR) 80 MG tablet, Take 80 mg by mouth at bedtime., Disp: , Rfl: 2   Brinzolamide-Brimonidine 1-0.2 % SUSP, Place 1 drop into both eyes 2 (two) times daily. , Disp: , Rfl:    cetirizine  (ZYRTEC) 10 MG tablet, Take 10 mg by mouth daily., Disp: , Rfl:    cholecalciferol (VITAMIN D3) 25 MCG (1000 UNIT) tablet, Take 1,000 Units by mouth daily., Disp: , Rfl:    ezetimibe (ZETIA) 10 MG tablet, TAKE ONE TABLET BY MOUTH DAILY FOR CHOLESTEROL., Disp: , Rfl:    finasteride (PROSCAR) 5 MG tablet, Take 5 mg by mouth every morning. , Disp: , Rfl: 3   furosemide (LASIX) 40 MG tablet, Take 40 mg by mouth every morning., Disp: , Rfl:    insulin glargine (LANTUS) 100 UNIT/ML injection, Inject 25 Units into the skin at bedtime., Disp: , Rfl:    insulin glargine-yfgn (SEMGLEE) 100 UNIT/ML Pen, INJECT 25 UNITS SUBCUTANEOUSLY DAILY, Disp: , Rfl:    ipratropium (ATROVENT) 0.06 % nasal spray, Place 1 spray into both nostrils in the morning and at bedtime., Disp: , Rfl:    latanoprost (XALATAN) 0.005 % ophthalmic solution, Place 1 drop into both eyes at bedtime. , Disp: , Rfl:    lisinopril (ZESTRIL) 10 MG tablet, Take 10 mg by mouth daily., Disp: , Rfl:    metFORMIN (GLUCOPHAGE) 500 MG tablet, Take 500 mg by mouth 2 (two) times daily with a meal., Disp: , Rfl:    OMEPRAZOLE  PO, TAKE ONE CAPSULE BY MOUTH DAILY (TAKE ON AN EMPTY STOMACH 30 MINUTES PRIOR TO A MEAL), Disp: , Rfl:    oxyCODONE-acetaminophen (PERCOCET/ROXICET) 5-325 MG tablet, Take 1 tablet by mouth every 6 (six) hours as needed for moderate pain., Disp: 20 tablet, Rfl: 0   potassium chloride SA (KLOR-CON M) 20 MEQ tablet, Take 20 mEq by mouth daily., Disp: , Rfl:    Semaglutide,0.25 or 0.'5MG'$ /DOS, 2 MG/1.5ML SOPN, Inject 0.5 mg into the skin every Monday., Disp: , Rfl:    tamsulosin (FLOMAX) 0.4 MG CAPS capsule, Take 0.4 mg by mouth at bedtime. , Disp: , Rfl:    triamcinolone (NASACORT) 55 MCG/ACT AERO nasal inhaler, Place 1 spray into the nose 2 (two) times daily., Disp: , Rfl:   Social History   Tobacco Use  Smoking Status Former   Years: 30.00   Types: Cigarettes   Quit date: 01/28/1995   Years since quitting: 26.8  Smokeless  Tobacco Never    Allergies  Allergen Reactions   Atorvastatin Other (See Comments)    Muscle pain   Chlorthalidone Other (See Comments)    Pt unsure of reaction   Pravastatin Other (See Comments)    Muscle pain   Latex Hives   Lisinopril Hives   Rosuvastatin Other (See Comments)    Muscle pain   Tape Hives    Latex tape   Objective:  There were no vitals filed for this visit. There is no height or weight on file to calculate BMI. Constitutional Well developed. Well nourished.  Vascular Dorsalis pedis pulses nonpalpable bilaterally. Posterior tibial pulses non 2 palpable bilaterally. Capillary refill diminished to all digits.  No cyanosis or clubbing noted. Pedal hair growth normal.  Neurologic Normal speech. Oriented to person, place, and time. Epicritic sensation to light touch grossly present bilaterally.  Dermatologic Nails well groomed and normal in appearance. No open wounds. No skin lesions.  Orthopedic: No further pain on palpation to the right ankle medial gutter.  No further pain with range of motion of the ankle joint.  No further deep intra-articular pain noted.  No pain at the Achilles tendon, peroneal tendon.  No further generalized circumferential ankle pain noted at the ATFL ligament as well as medial gutter as well.   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  Posterior and plantar heel spurring noted.  Midfoot arthritis noted.  No other bony abnormalities identified. Assessment:   No diagnosis found.    Plan:  Patient was evaluated and treated and all questions answered.  Vascular abnormality with underlying peripheral vascular disease -Patient has had intervention done by vascular surgery for which his pain has improved considerably.  At this time if any foot and ankle issues arise in the future of asked him to come back and see me.  He states  understanding.  Rightankle capsulitis/generalized ankle pain with underlying antalgic gait -Clinically resolved with reintervention revascularization   No follow-ups on file.

## 2022-01-16 ENCOUNTER — Ambulatory Visit
Admission: RE | Admit: 2022-01-16 | Discharge: 2022-01-16 | Disposition: A | Discharge status: Home / Self Care | Payer: Medicare HMO | Source: Ambulatory Visit | Attending: Family Medicine | Admitting: Family Medicine

## 2022-01-16 ENCOUNTER — Other Ambulatory Visit: Payer: Self-pay | Admitting: Family Medicine

## 2022-01-16 DIAGNOSIS — R053 Chronic cough: Secondary | ICD-10-CM

## 2022-01-16 DIAGNOSIS — R059 Cough, unspecified: Secondary | ICD-10-CM | POA: Diagnosis not present

## 2022-02-04 NOTE — Progress Notes (Signed)
HISTORY AND PHYSICAL     CC:  follow up. Requesting Provider:  [name], MD  HPI: This is a 76 y.o. male who is here today for follow up for PAD.  Pt has hx of  right external iliac artery stenting, iliofemoral endarterectomy extending on the profunda, patched using bovine pericardium on 10/02/2021 by Dr. Virl Cagey.    Pt was last seen 11/07/2021 and at that time, his pre operative rest pain had resolved.  He was having some right ankle swelling.  He did have some irritation around the incisions and was continuing to paint with betadine and use dry gauze.  He was continuing to follow with podiatry.  He had brisk monophasic doppler flow on the right.  He was instructed to continue graduated walking program.  His ABI on the left was 73% but since he was asymptomatic, we would continue to watch this.  He was to f/u in 3 months with studies.    The pt returns today for follow up and here with his wife.  He tells me he is doing well.  He still have some swelling in his foot on the right.  He does not have any non healing wounds.  He has been compliant with his asa/statin.  He has not been on plavix as he states he was told to take asa.  He does elevate his legs, which helps with the swelling.    He states he is getting a right knee replacement in a couple of months.   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  His wife wants a rescue dog.    The pt is on a statin for cholesterol management.    The pt is on an aspirin.    Other AC:  none The pt is on CCB, diuretic, ACEI for hypertension.  The pt does  have diabetes. Tobacco hx:  former  Pt does not have family hx of AAA.  Past Medical History:  Diagnosis Date   Aortic valve stenosis, moderate    per echo 06-02-2017 (in epic)  ef 55-60%,  possible bicuspid AV,  severeely thickened and calcified with severely restricted leaflet opening,  valve area 1.17cm^2, mean gradient 19mHg, peak gradient 528mg,  moderate AR   Arthritis    oa    BPH associated with nocturia    Cancer (HCC)    prostate cancer   Complication of anesthesia    due to heart murmur  mod to severe aortic stenosis   Coronary artery disease    Full dentures    Glaucoma, both eyes    Heart murmur, systolic    Hiatal hernia    History of kidney stones    Hypertension    Left ureteral stone    [place] [place] [facility] [place]   [facility] [place] [facility]   Peripheral vascular disease ([facility] [place]   S[place] (systemic inflammatory response syndrome) [place]   hospital admission--  unknown etiology   Type 2 diabetes mellitus ([facility]   Wears glasses     Past Surgical History:  Procedure Laterality Date   COLONOSCOPY     CORONARY ARTERY BYPASS GRAFT     pt reports Aortic valve replacement and CABG in 2020   [place] 02/[place]2018   Procedure: CYSTOSCOPY WITH RETROGRADE PYELOGRAM, URETEROSCOPY AND STENT [place] Surgeon: MaAlexis FrockMD;  [facility];  Service: Urology;  Laterality: Left;   ENDARTERECTOMY FEMORAL Right 10/02/2021   Procedure: RIGHT COMMON FEMORAL ENDARTERECTOMY WITH 1CM X 14CM Weyman Pedro;  Surgeon: [name], MD;  Location: [facility];  Service: Vascular;  Laterality: Right;   EXAM UNDER ANESTHESIA WITH MANIPULATION OF KNEE Left 09/30/2017   Procedure: EXAM UNDER ANESTHESIA WITH MANIPULATION OF KNEE;  Surgeon: [name], MD;  [facility];  Service: Orthopedics;  Laterality: Left;  30 mins   INSERTION OF ILIAC STENT Right 10/02/2021   Procedure: INSERTION OF RIGHT ILIAC STENT;  Surgeon: [name], MD;  Location: [facility];  Service: Vascular;  Laterality: Right;   PATCH ANGIOPLASTY Right 10/02/2021   Procedure: PATCH ANGIOPLASTY;  Surgeon: [name], MD;  Location: [facility] OR;  Service: Vascular;  Laterality: Right;   PROSTATE BIOPSY  07/2017   cancerous polyps x2  watching psa dr Tresa Moore at [facility] Left 05/28/2017   Procedure: LEFT TOTAL KNEE ARTHROPLASTY;  Surgeon: [name], MD;  [facility];  Service: Orthopedics;  Laterality: Left;   TRANSTHORACIC ECHOCARDIOGRAM  06/02/2017   ef 55-60%/  possible AV bicuspid,  severely thickened and calcified leaflets with severely restricted leaflet opening, moderate regurg.(valve area 1.17cm^2,  mean gradient 33mHg, peak gradient 553mg)/  mild LAE/  mild TR/  mild MV thickened leaflets and regurg. (not restricted and no stenosis    Allergies  Allergen Reactions   Atorvastatin Other (See Comments)    Muscle pain   Chlorthalidone Other (See Comments)    Pt unsure of reaction   Pravastatin Other (See Comments)    Muscle pain   Latex Hives   Lisinopril Hives   Rosuvastatin Other (See Comments)    Muscle pain   Tape Hives    Latex tape    Current Outpatient Medications  Medication Sig Dispense Refill   acetaminophen (TYLENOL) 500 MG tablet Take 1,000 mg by mouth every 6 (six) hours as needed for moderate pain.      amLODipine (NORVASC) 10 MG tablet Take 10 mg by mouth every morning.      aspirin 81 MG chewable tablet Chew 81 mg by mouth daily.     atorvastatin (LIPITOR) 80 MG tablet Take 80 mg by mouth at bedtime.  2   Brinzolamide-Brimonidine 1-0.2 % SUSP Place 1 drop into both eyes 2 (two) times daily.      cetirizine (ZYRTEC) 10 MG tablet Take 10 mg by mouth daily.     cholecalciferol (VITAMIN D3) 25 MCG (1000 UNIT) tablet Take 1,000 Units by mouth daily.     ezetimibe (ZETIA) 10 MG tablet TAKE ONE TABLET BY MOUTH DAILY FOR CHOLESTEROL.     finasteride (PROSCAR) 5 MG tablet Take 5 mg by mouth every morning.   3   furosemide (LASIX) 40 MG tablet Take 40 mg by mouth every morning.     insulin glargine (LANTUS) 100 UNIT/ML injection Inject 25 Units into the skin at bedtime.     insulin glargine-yfgn (SEMGLEE) 100 UNIT/ML Pen INJECT 25 UNITS SUBCUTANEOUSLY DAILY     ipratropium (ATROVENT) 0.06 % nasal spray  Place 1 spray into both nostrils in the morning and at bedtime.     latanoprost (XALATAN) 0.005 % ophthalmic solution Place 1 drop into both eyes at bedtime.      lisinopril (ZESTRIL) 10 MG tablet Take 10 mg by mouth daily.     metFORMIN (GLUCOPHAGE) 500 MG tablet Take 500 mg by mouth 2 (two) times daily with a  meal.     OMEPRAZOLE PO TAKE ONE CAPSULE BY MOUTH DAILY (TAKE ON AN EMPTY STOMACH 30 MINUTES PRIOR TO A MEAL)     oxyCODONE-acetaminophen (PERCOCET/ROXICET) 5-325 MG tablet Take 1 tablet by mouth every 6 (six) hours as needed for moderate pain. 20 tablet 0   potassium chloride SA (KLOR-CON M) 20 MEQ tablet Take 20 mEq by mouth daily.     Semaglutide,0.25 or 0.'5MG'$ /DOS, 2 MG/1.5ML SOPN Inject 0.5 mg into the skin every Monday.     tamsulosin (FLOMAX) 0.4 MG CAPS capsule Take 0.4 mg by mouth at bedtime.      triamcinolone (NASACORT) 55 MCG/ACT AERO nasal inhaler Place 1 spray into the nose 2 (two) times daily.     No current facility-administered medications for this visit.    No family history on file.  Social History   Socioeconomic History   Marital status: Married    Spouse name: Not on file   Number of children: Not on file   Years of education: Not on file   Highest education level: Not on file  Occupational History   Not on file  Tobacco Use   Smoking status: Former    Years: 30.00    Types: Cigarettes    Quit date: 01/28/1995    Years since quitting: 27.0   Smokeless tobacco: Never  Vaping Use   Vaping Use: Never used  Substance and Sexual Activity   Alcohol use: No   Drug use: No   Sexual activity: Not Currently  Other Topics Concern   Not on file  Social History Narrative   Not on file   Social Determinants of Health   Financial Resource Strain: Not on file  Food Insecurity: Not on file  Transportation Needs: Not on file  Physical Activity: Not on file  Stress: Not on file  Social Connections: Not on file  Intimate Partner Violence: Not on file      REVIEW OF SYSTEMS:   '[X]'$  denotes positive finding, '[ ]'$  denotes negative finding Cardiac  Comments:  Chest pain or chest pressure:    Shortness of breath upon exertion:    Short of breath when lying flat:    Irregular heart rhythm:        Vascular    Pain in calf, thigh, or hip brought on by ambulation:    Pain in feet at night that wakes you up from your sleep:     Blood clot in your veins:    Leg swelling:  x See HPI      Pulmonary    Oxygen at home:    Productive cough:     Wheezing:         Neurologic    Sudden weakness in arms or legs:     Sudden numbness in arms or legs:     Sudden onset of difficulty speaking or slurred speech:    Temporary loss of vision in one eye:     Problems with dizziness:         Gastrointestinal    Blood in stool:     Vomited blood:         Genitourinary    Burning when urinating:     Blood in urine:        Psychiatric    Major depression:         Hematologic    Bleeding problems:    Problems with blood clotting too easily:        Skin  Rashes or ulcers:        Constitutional    Fever or chills:      PHYSICAL EXAMINATION:  Today's Vitals   02/06/22 1032  BP: 133/72  Pulse: 64  Resp: 20  Temp: 97.8 F (36.6 C)  TempSrc: Temporal  SpO2: 100%  Weight: 182 lb 3.2 oz (82.6 kg)  Height: '5\' 9"'$  (1.753 m)   Body mass index is 26.91 kg/m.   General:  WDWN in NAD; vital signs documented above Gait: Not observed HENT: WNL, normocephalic Pulmonary: normal non-labored breathing , without wheezing Cardiac: regular HR, without carotid bruits Abdomen: soft, NT; aortic pulse is not palpable Skin: without rashes Vascular Exam/Pulses:  Right Left  Radial 2+ (normal) 2+ (normal)  Femoral 2+ (normal) 2+ (normal)  Popliteal Unable to palpate Unable to palpate  AT monophasic monophasic  PT Brisk biphasic Brisk biphasic  Peroneal monophasic monophasic   Extremities: without ischemic changes, without Gangrene , without  cellulitis; without open wounds; mild right foot swelling; very mild hemosiderin staining BLE with superficial spider veins pressent. Musculoskeletal: no muscle wasting or atrophy  Neurologic: A&O X 3 Psychiatric:  The pt has Normal affect.   Non-Invasive Vascular Imaging:   ABI's/TBI's on 02/06/2022: Right:  0.59/0.40 - Great toe pressure: 55 Left:  0.67/0.64 - Great toe pressure: 88   Previous ABI's/TBI's on 08/08/2021: Right:  0.42/absent - Great toe pressure: absent Left:  0.73/0.49 - Great toe pressure:  60    ASSESSMENT/PLAN:: 76 y.o. male here for follow up for PAD with hx of right external iliac artery stenting, iliofemoral endarterectomy extending on the profunda, patched using bovine pericardium on 10/02/2021 by Dr. Virl Cagey.     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  He does not have any non healing wounds or rest pain.   -encouraged him to continue graduated walking program.   -continue elevation of legs.  Offered to be measured for mild compression socks, but he declined.  Discussed if he changes his mind to let us know. -continue asa/statin -pt will f/u in 6 months with ABI and right aortoiliac arterial duplex. -he will call sooner if he has any rest pain or non healing wounds.     Leontine Locket, [facility] [phone]  Clinic MD:   Virl Cagey

## 2022-02-06 ENCOUNTER — Ambulatory Visit (HOSPITAL_COMMUNITY)
Admission: RE | Admit: 2022-02-06 | Discharge: 2022-02-06 | Disposition: A | Discharge status: Home / Self Care | Payer: Medicare HMO | Source: Ambulatory Visit | Attending: Vascular Surgery | Admitting: Vascular Surgery

## 2022-02-06 ENCOUNTER — Encounter: Payer: Self-pay | Admitting: Physician Assistant

## 2022-02-06 ENCOUNTER — Ambulatory Visit: Payer: Medicare HMO | Admitting: Physician Assistant

## 2022-02-06 VITALS — BP 133/72 | HR 64 | Temp 97.8°F | Resp 20 | Ht 69.0 in | Wt 182.2 lb

## 2022-02-06 DIAGNOSIS — Z95828 Presence of other vascular implants and grafts: Secondary | ICD-10-CM

## 2022-02-06 DIAGNOSIS — I739 Peripheral vascular disease, unspecified: Secondary | ICD-10-CM

## 2022-02-09 NOTE — Progress Notes (Signed)
Fax received for medical clearance/medication hold for Dr. Virl Cagey.  Provider signed, form faxed back to sender, verified successful.

## 2022-02-13 ENCOUNTER — Other Ambulatory Visit: Payer: Self-pay

## 2022-02-13 DIAGNOSIS — I70221 Atherosclerosis of native arteries of extremities with rest pain, right leg: Secondary | ICD-10-CM

## 2022-02-13 DIAGNOSIS — I739 Peripheral vascular disease, unspecified: Secondary | ICD-10-CM

## 2022-03-24 NOTE — Progress Notes (Signed)
Sent message, via epic in basket, requesting orders in epic from surgeon.  

## 2022-03-26 ENCOUNTER — Encounter (HOSPITAL_COMMUNITY): Payer: Self-pay

## 2022-03-26 NOTE — Patient Instructions (Addendum)
SURGICAL WAITING ROOM VISITATION Patients having surgery or a procedure may have no more than 2 support people in the waiting area - these visitors may rotate.   Children under the age of 40 must have an adult with them who is not the patient. If the patient needs to stay at the hospital during part of their recovery, the visitor guidelines for inpatient rooms apply. Pre-op nurse will coordinate an appropriate time for 1 support person to accompany patient in pre-op.  This support person may not rotate.    Please refer to the [facility] website for the visitor guidelines for Inpatients (after your surgery is over and you are in a regular room).      Your procedure is scheduled on: 04-08-22   Report to [facility] Main Entrance    Report to admitting at 6:15 AM   Call this number if you have problems the morning of surgery [phone]   Do not eat food :After Midnight.   After Midnight you may have the following liquids until 5:30 AM DAY OF SURGERY  Water Non-Citrus Juices (without pulp, NO RED) Carbonated Beverages Black Coffee (NO MILK/CREAM OR CREAMERS, sugar ok)  Clear Tea (NO MILK/CREAM OR CREAMERS, sugar ok) regular and decaf                             Plain Jell-O (NO RED)                                           Fruit ices (not with fruit pulp, NO RED)                                     Popsicles (NO RED)                                                               Sports drinks like Gatorade (NO RED)                   The day of surgery:  Drink ONE (1) Pre-Surgery G2 at 5:30 AM the morning of surgery. Drink in one sitting. Do not sip.  This drink was given to you during your hospital  pre-op appointment visit. Nothing else to drink after completing the Pre-Surgery G2.          If you have questions, please contact your surgeon's office.   FOLLOW ANY ADDITIONAL PRE OP INSTRUCTIONS YOU RECEIVED FROM YOUR SURGEON'S OFFICE!!!     Oral Hygiene is also  important to reduce your risk of infection.                                    Remember - BRUSH YOUR TEETH THE MORNING OF SURGERY WITH YOUR REGULAR TOOTHPASTE   Do NOT smoke after Midnight   Take these medicines the morning of surgery with A SIP OF WATER Amlodipine Carvedilol Zyrtec Ezetimibe Finasteride Omeprazole Okay to use eyedrops, nasal spray Tylenol if needed  How to Manage Your Diabetes Before and After Surgery  Why is it important to control my blood sugar before and after surgery? Improving blood sugar levels before and after surgery helps healing and can limit problems. A way of improving blood sugar control is eating a healthy diet by:  Eating less sugar and carbohydrates  Increasing activity/exercise  Talking with your doctor about reaching your blood sugar goals High blood sugars (greater than 180 mg/dL) can raise your risk of infections and slow your recovery, so you will need to focus on controlling your diabetes during the weeks before surgery. Make sure that the doctor who takes care of your diabetes knows about your planned surgery including the date and location.  How do I manage my blood sugar before surgery? Check your blood sugar at least 4 times a day, starting 2 days before surgery, to make sure that the level is not too high or low. Check your blood sugar the morning of your surgery when you wake up and every 2 hours until you get to the Short Stay unit. If your blood sugar is less than 70 mg/dL, you will need to treat for low blood sugar: Do not take insulin. Treat a low blood sugar (less than 70 mg/dL) with  cup of clear juice (cranberry or apple), 4 glucose tablets, OR glucose gel. Recheck blood sugar in 15 minutes after treatment (to make sure it is greater than 70 mg/dL). If your blood sugar is not greater than 70 mg/dL on recheck, call [phone] for further instructions. Report your blood sugar to the short stay nurse when you get to Short  Stay.  If you are admitted to the hospital after surgery: Your blood sugar will be checked by the staff and you will probably be given insulin after surgery (instead of oral diabetes medicines) to make sure you have good blood sugar levels. The goal for blood sugar control after surgery is 80-180 mg/dL.   WHAT DO I DO ABOUT MY DIABETES MEDICATION?        Hold Semaglutide 7 days before surgery (do not take Semaglutide after 03/31/22)  Do not take oral diabetes medicines (pills) the morning of surgery.  THE NIGHT BEFORE SURGERY:  Take  12 units of insulin Glargine.      DO NOT TAKE THE FOLLOWING 7 DAYS PRIOR TO SURGERY: Ozempic, Wegovy, Rybelsus (Semaglutide), Byetta (exenatide), Bydureon (exenatide ER), Victoza, Saxenda (liraglutide), or Trulicity (dulaglutide) Mounjaro (Tirzepatide) Adlyxin (Lixisenatide), Polyethylene Glycol Loxenatide.  Reviewed and Endorsed by [facility] Patient Education Committee, August 2015                               You may not have any metal on your body including  jewelry, and body piercing             Do not wear lotions, powders, cologne, or deodorant              Men may shave face and neck.   Do not bring valuables to the hospital. [place].   Contacts, dentures or bridgework may not be worn into surgery.   DO NOT [facility]. PHARMACY WILL DISPENSE MEDICATIONS LISTED ON YOUR MEDICATION LIST TO YOU DURING YOUR ADMISSION [facility]!    Patients discharged on the day of surgery will not be allowed to drive home.  Someone NEEDS to  stay with you for the first 24 hours after anesthesia.   Special Instructions: Bring a copy of your healthcare power of attorney and living will documents the day of surgery if you haven't scanned them before.              Please read over the following fact sheets you were given: IF [facility]  If you received a COVID test during your pre-op visit  it is requested that you wear a mask when out in public, stay away from anyone that may not be feeling well and notify your surgeon if you develop symptoms. If you test positive for Covid or have been in contact with anyone that has tested positive in the last 10 days please notify you surgeon.  [facility] - Preparing for Surgery Before surgery, you can play an important role.  Because skin is not sterile, your skin needs to be as free of germs as possible.  You can reduce the number of germs on your skin by washing with CHG (chlorahexidine gluconate) soap before surgery.  CHG is an antiseptic cleaner which kills germs and bonds with the skin to continue killing germs even after washing. Please DO NOT use if you have an allergy to CHG or antibacterial soaps.  If your skin becomes reddened/irritated stop using the CHG and inform your nurse when you arrive at Short Stay. Do not shave (including legs and underarms) for at least 48 hours prior to the first CHG shower.  You may shave your face/neck.  Please follow these instructions carefully:  1.  Shower with CHG Soap the night before surgery and the  morning of surgery.  2.  If you choose to wash your hair, wash your hair first as usual with your normal  shampoo.  3.  After you shampoo, rinse your hair and body thoroughly to remove the shampoo.                             4.  Use CHG as you would any other liquid soap.  You can apply chg directly to the skin and wash.  Gently with a scrungie or clean washcloth.  5.  Apply the CHG Soap to your body ONLY FROM THE NECK DOWN.   Do   not use on face/ open                           Wound or open sores. Avoid contact with eyes, ears mouth and   genitals (private parts).                       Wash face,  Genitals (private parts) with your normal soap.             6.  Wash thoroughly, paying special attention to the area where your     surgery  will be performed.  7.  Thoroughly rinse your body with warm water from the neck down.  8.  DO NOT shower/wash with your normal soap after using and rinsing off the CHG Soap.                9.  Pat yourself dry with a clean towel.            10.  Wear clean pajamas.  11.  Place clean sheets on your bed the night of your first shower and do not  sleep with pets. Day of Surgery : Do not apply any lotions/deodorants the morning of surgery.  Please wear clean clothes to the hospital/[facility].  FAILURE TO FOLLOW THESE INSTRUCTIONS MAY RESULT IN THE CANCELLATION OF YOUR SURGERY  PATIENT SIGNATURE_________________________________  NURSE SIGNATURE__________________________________  ________________________________________________________________________     [facility]  An incentive spirometer is a tool that can help keep your lungs clear and active. This tool measures how well you are filling your lungs with each breath. Taking long deep breaths may help reverse or decrease the chance of developing breathing (pulmonary) problems (especially infection) following: A long period of time when you are unable to move or be active. BEFORE THE PROCEDURE  If the spirometer includes an indicator to show your best effort, your nurse or respiratory therapist will set it to a desired goal. If possible, sit up straight or lean slightly forward. Try not to slouch. Hold the incentive spirometer in an upright position. INSTRUCTIONS FOR USE  Sit on the edge of your bed if possible, or sit up as far as you can in bed or on a chair. Hold the incentive spirometer in an upright position. Breathe out normally. Place the mouthpiece in your mouth and seal your lips tightly around it. Breathe in slowly and as deeply as possible, raising the piston or the ball toward the top of the column. Hold your breath for 3-5 seconds or for as long as possible. Allow the piston or ball to fall  to the bottom of the column. Remove the mouthpiece from your mouth and breathe out normally. Rest for a few seconds and repeat Steps 1 through 7 at least 10 times every 1-2 hours when you are awake. Take your time and take a few normal breaths between deep breaths. The spirometer may include an indicator to show your best effort. Use the indicator as a goal to work toward during each repetition. After each set of 10 deep breaths, practice coughing to be sure your lungs are clear. If you have an incision (the cut made at the time of surgery), support your incision when coughing by placing a pillow or rolled up towels firmly against it. Once you are able to get out of bed, walk around indoors and cough well. You may stop using the incentive spirometer when instructed by your caregiver.  RISKS AND COMPLICATIONS Take your time so you do not get dizzy or light-headed. If you are in pain, you may need to take or ask for pain medication before doing incentive spirometry. It is harder to take a deep breath if you are having pain. AFTER USE Rest and breathe slowly and easily. It can be helpful to keep track of a log of your progress. Your caregiver can provide you with a simple table to help with this. If you are using the spirometer at home, follow these instructions: [place] IF:  You are having difficultly using the spirometer. You have trouble using the spirometer as often as instructed. Your pain medication is not giving enough relief while using the spirometer. You develop fever of 100.5 F (38.1 C) or higher. SEEK IMMEDIATE MEDICAL CARE IF:  You cough up bloody sputum that had not been present before. You develop fever of 102 F (38.9 C) or greater. You develop worsening pain at or near the incision site. MAKE SURE YOU:  Understand these instructions. Will watch your  condition. Will get help right away if you are not doing well or get worse. Document Released: 09/28/2006 Document  Revised: 08/10/2011 Document Reviewed: 11/29/2006 [facility] Patient Information 2014 [place], [place].   ________________________________________________________________________

## 2022-03-26 NOTE — Progress Notes (Addendum)
COVID Vaccine Completed:  Yes  Date of COVID positive in last 90 days:  No  PCP - [name], MD and Lolita Lenz at [facility] - Robynn Pane, MD   Cardiac clearance on chart dated 01-28-22  Clearance on chart from Dr. Virl Cagey recommending repeat echo  Chest x-ray - 01-16-22 Epic EKG - 10-02-21 Epic Stress Test - greater than 2 years ECHO - 2019 Epic Cardiac Cath - greater than 2 years Pacemaker/ICD device last checked: Spinal Cord Stimulator:  N/A  Bowel Prep - N/A  Sleep Study - Yes, neg sleep apnea CPAP - No  Fasting Blood Sugar - 120 to 140 Checks Blood Sugar - 1 time a day  Blood Thinner Instructions: Aspirin Instructions:  ASA 81.  Patient states that he was told to stay on it Last Dose:  Activity level:  Can go up a flight of stairs and perform activities of daily living without stopping and without symptoms of chest pain or shortness of breath.  Anesthesia review:  CAD, chronic ischemic heart disease, aortic stenosis, heart murmur, PAD, HTN, DM, SIRS. Hx of CABG and AVR  Patient denies shortness of breath, fever, cough and chest pain at PAT appointment  Patient verbalized understanding of instructions that were given to them at the PAT appointment. Patient was also instructed that they will need to review over the PAT instructions again at home before surgery.

## 2022-03-31 ENCOUNTER — Encounter (HOSPITAL_COMMUNITY): Payer: Self-pay

## 2022-03-31 ENCOUNTER — Other Ambulatory Visit: Payer: Self-pay

## 2022-03-31 ENCOUNTER — Encounter (HOSPITAL_COMMUNITY)
Admission: RE | Admit: 2022-03-31 | Discharge: 2022-03-31 | Disposition: A | Discharge status: Home / Self Care | Payer: No Typology Code available for payment source | Source: Ambulatory Visit | Attending: Specialist | Admitting: Specialist

## 2022-03-31 VITALS — BP 138/71 | HR 71 | Temp 97.7°F | Resp 20 | Ht 69.0 in | Wt 181.4 lb

## 2022-03-31 DIAGNOSIS — E119 Type 2 diabetes mellitus without complications: Secondary | ICD-10-CM | POA: Insufficient documentation

## 2022-03-31 DIAGNOSIS — Z01818 Encounter for other preprocedural examination: Secondary | ICD-10-CM

## 2022-03-31 DIAGNOSIS — Z794 Long term (current) use of insulin: Secondary | ICD-10-CM | POA: Insufficient documentation

## 2022-03-31 DIAGNOSIS — E1151 Type 2 diabetes mellitus with diabetic peripheral angiopathy without gangrene: Secondary | ICD-10-CM | POA: Insufficient documentation

## 2022-03-31 DIAGNOSIS — I2510 Atherosclerotic heart disease of native coronary artery without angina pectoris: Secondary | ICD-10-CM | POA: Diagnosis not present

## 2022-03-31 DIAGNOSIS — M1711 Unilateral primary osteoarthritis, right knee: Secondary | ICD-10-CM | POA: Diagnosis not present

## 2022-03-31 DIAGNOSIS — I10 Essential (primary) hypertension: Secondary | ICD-10-CM | POA: Diagnosis not present

## 2022-03-31 DIAGNOSIS — Z951 Presence of aortocoronary bypass graft: Secondary | ICD-10-CM | POA: Insufficient documentation

## 2022-03-31 DIAGNOSIS — Z87891 Personal history of nicotine dependence: Secondary | ICD-10-CM | POA: Insufficient documentation

## 2022-03-31 DIAGNOSIS — Z01812 Encounter for preprocedural laboratory examination: Secondary | ICD-10-CM | POA: Insufficient documentation

## 2022-03-31 LAB — COMPREHENSIVE METABOLIC PANEL
ALT: 26 U/L (ref 0–44)
AST: 18 U/L (ref 15–41)
Albumin: 3.6 g/dL (ref 3.5–5.0)
Alkaline Phosphatase: 82 U/L (ref 38–126)
Anion gap: 5 (ref 5–15)
BUN: 21 mg/dL (ref 8–23)
CO2: 23 mmol/L (ref 22–32)
Calcium: 9.2 mg/dL (ref 8.9–10.3)
Chloride: 110 mmol/L (ref 98–111)
Creatinine, Ser: 1.19 mg/dL (ref 0.61–1.24)
GFR, Estimated: >60 mL/min (ref >60)
Glucose, Bld: 197 mg/dL — ABNORMAL HIGH (ref 70–99)
Potassium: 4.3 mmol/L (ref 3.5–5.1)
Sodium: 138 mmol/L (ref 135–145)
Total Bilirubin: 0.9 mg/dL (ref 0.3–1.2)
Total Protein: 7.3 g/dL (ref 6.5–8.1)

## 2022-03-31 LAB — CBC
HCT: 43.5 % (ref 39.0–52.0)
Hemoglobin: 13.8 g/dL (ref 13.0–17.0)
MCH: 29.4 pg (ref 26.0–34.0)
MCHC: 31.7 g/dL (ref 30.0–36.0)
MCV: 92.8 fL (ref 80.0–100.0)
Platelets: 205 10*3/uL (ref 150–400)
RBC: 4.69 MIL/uL (ref 4.22–5.81)
RDW: 14.2 % (ref 11.5–15.5)
WBC: 4.8 10*3/uL (ref 4.0–10.5)
nRBC: 0 % (ref 0.0–0.2)

## 2022-03-31 LAB — SURGICAL PCR SCREEN
MRSA, PCR: NEGATIVE
Staphylococcus aureus: NEGATIVE

## 2022-03-31 LAB — HEMOGLOBIN A1C
Hgb A1c MFr Bld: 7.5 % — ABNORMAL HIGH (ref 4.8–5.6)
Mean Plasma Glucose: 168.55 mg/dL

## 2022-03-31 LAB — GLUCOSE, CAPILLARY: Glucose-Capillary: 200 mg/dL — ABNORMAL HIGH (ref 70–99)

## 2022-03-31 LAB — NO BLOOD PRODUCTS

## 2022-04-01 ENCOUNTER — Ambulatory Visit (HOSPITAL_COMMUNITY): Payer: No Typology Code available for payment source | Admitting: Certified Registered"

## 2022-04-01 ENCOUNTER — Ambulatory Visit (HOSPITAL_COMMUNITY): Payer: No Typology Code available for payment source | Admitting: Physician Assistant

## 2022-04-01 NOTE — Anesthesia Preprocedure Evaluation (Addendum)
Anesthesia Evaluation  Patient identified by MRN, date of birth, ID band Patient awake    Reviewed: Allergy & Precautions, NPO status , Patient's Chart, lab work & pertinent test results  History of Anesthesia Complications Negative for: history of anesthetic complications  Airway Mallampati: I  TM Distance: >3 FB Neck ROM: Full    Dental  (+) Edentulous Upper, Edentulous Lower   Pulmonary COPD, former smoker   breath sounds clear to auscultation       Cardiovascular hypertension, Pt. on home beta blockers and Pt. on medications (-) angina + CAD, + Past MI, + CABG and + Peripheral Vascular Disease  + Valvular Problems/Murmurs (s/p AVR)  Rhythm:Regular Rate:Normal  Echo 04/2019 (from Dr. Nena Alexander note 09/24/21 in care everywhere):  - Mild left ventricular hypertrophy - Normal left ventricular size and function, EF 60-65% - Grade II diastolic dysfunction - Right ventricle appears borderline dilated. TAPSE=1.1cm suggests reduced systolic function, but visually systolic function appears preserved 4m Carpentier-Edwards Perimount Magna Ease pericardial bioprosthetic valve is well-seated in the aortic position with no significant regurgitation or stenosis; peak velocity 1.5 m/s. - Mild mitral regurgitation    Neuro/Psych glaucoma    GI/Hepatic Neg liver ROS,GERD  Medicated and Controlled,,  Endo/Other  diabetes (glu 140), Insulin Dependent, Oral Hypoglycemic Agents    Renal/GU Renal InsufficiencyRenal disease     Musculoskeletal  (+) Arthritis ,    Abdominal   Peds  Hematology  (+) JEHOVAH'S WITNESS (Pt is Jehovah's Witness and  refuses blood products- pt brought list of acceptable blood products to PAT- form will need to be signed by MD)  Anesthesia Other Findings   Reproductive/Obstetrics                              Anesthesia Physical Anesthesia Plan  ASA: 3  Anesthesia Plan: Spinal    Post-op Pain Management: Tylenol PO (pre-op)* and Regional block*   Induction:   PONV Risk Score and Plan: 1 and Ondansetron  Airway Management Planned: Natural Airway and Simple Face Mask  Additional Equipment: None  Intra-op Plan:   Post-operative Plan:   Informed Consent: I have reviewed the patients History and Physical, chart, labs and discussed the procedure including the risks, benefits and alternatives for the proposed anesthesia with the patient or authorized representative who has indicated his/her understanding and acceptance.     Dental advisory given  Plan Discussed with: CRNA and Surgeon  Anesthesia Plan Comments: (Addendum: Dr CTheda Sersis cancelling the procedure today)         Anesthesia Quick Evaluation

## 2022-04-01 NOTE — Progress Notes (Signed)
Anesthesia Chart Review   Case: [id number] Date/Time: 04/08/22 0815   Procedure: TOTAL KNEE ARTHROPLASTY (Right: Knee) - adductor canal  120   Anesthesia type: Spinal   Pre-op diagnosis: Right knee osteoarthritis   [facility] 08 / [facility]   Surgeons: [name], MD       DISCUSSION:76 y.o. former smoker with h/o HTN, DM II, AS (s/p bioprosthetic AVR 2020), CAD (s/p CABG 2020: SVG-OM1, LIMA-LAD), PVD, right knee OA scheduled for above procedure 04/08/2022 with Dr. [name].   S/p right external iliac artery stenting, iliofemoral endarterectomy extending on the profunda, patched using bovine pericardium on 10/02/2021. Last seen by vascular surgery 02/06/2022. Stable at this visit. Clearance received from vascular surgery.   Pt is Jehovah's Witness and  refuses blood products- pt brought list of acceptable blood products to PAT- form will need to be signed by MD and faxed to blood bank on DOS. Per Dr. Virl Cagey note, MD is aware that pt refuses blood products.   Clearance from cardiology on chart which states pt is moderate risk.  Per note, "Intermediate risk given prior CAD/CABG. Normal LV function and AVR. No need for further testing. Would continue aspirin through surgery if able. Ok to stop aspirin 7 days prior if needed."  Anticipate pt can proceed with planned procedure barring acute status change.   VS: BP 138/71   Pulse 71   Temp 36.5 C (Oral)   Resp 20   Ht '5\' 9"'$  (1.753 m)   Wt 82.3 kg   SpO2 100%   BMI 26.79 kg/m   PROVIDERS: [name], MD is PCP    LABS: Labs reviewed: Acceptable for surgery. (all labs ordered are listed, but only abnormal results are displayed)  Labs Reviewed  COMPREHENSIVE METABOLIC PANEL - Abnormal; Notable for the following components:      Result Value   Glucose, Bld 197 (*)    All other components within normal limits  HEMOGLOBIN A1C - Abnormal; Notable for the following components:   Hgb A1c MFr Bld 7.5 (*)    All other  components within normal limits  GLUCOSE, CAPILLARY - Abnormal; Notable for the following components:   Glucose-Capillary 200 (*)    All other components within normal limits  SURGICAL PCR SCREEN  CBC     IMAGES:   EKG:   CV: Echo 04/2019 (from Dr. Nena Alexander note 09/24/21 in care everywhere):  - Mild left ventricular hypertrophy - Normal left ventricular size and function, EF 60-65% - Grade II diastolic dysfunction - Right ventricle appears borderline dilated. TAPSE=1.1cm suggests reduced systolic function, but visually systolic function appears preserved 15m Carpentier-Edwards Perimount Magna Ease pericardial bioprosthetic valve is well-seated in the aortic position with no significant regurgitation or stenosis; peak velocity 1.5 m/s. - Mild mitral regurgitation - Normal caliber IVC suggests normal right atrial pressure. - Estimated PASP 360mg, upper normal - No pericardial effusion - In comparison with the images of 07/08/2018 the aortic valve has been replaced.  Echo 06/02/2017 - Left ventricle: The cavity size was normal. There was moderate    concentric hypertrophy. Systolic function was normal. The    estimated ejection fraction was in the range of 55% to 60%. Wall    motion was normal; there were no regional wall motion    abnormalities.  - Aortic valve: Possibly bicuspid; severely thickened, severely    calcified leaflets. Valve mobility was restricted. There was    moderate stenosis. There was moderate regurgitation. Mean    gradient (  S): 30 mm Hg. Peak gradient (S): 53 mm Hg. Valve area    (VTI): 1.17 cm^2. Valve area (Vmax): 1.09 cm^2. Valve area    (Vmean): 0.99 cm^2.  - Mitral valve: Calcified annulus. Mildly thickened leaflets .    There was mild regurgitation.  - Left atrium: The atrium was mildly dilated.  - Right ventricle: The cavity size was moderately dilated. Wall    thickness was normal. Systolic function was mildly reduced.  - Tricuspid valve: There  was mild regurgitation.  - Pulmonary arteries: Systolic pressure was within the normal    range.  - Inferior vena cava: The vessel was normal in size.  - Pericardium, extracardiac: There was no pericardial effusion.  Past Medical History:  Diagnosis Date   Aortic valve stenosis, moderate    per echo 06-02-2017 (in epic)  ef 55-60%,  possible bicuspid AV,  severeely thickened and calcified with severely restricted leaflet opening,  valve area 1.17cm^2, mean gradient 25mHg, peak gradient 582mg,  moderate AR   Arthritis    oa   BPH associated with nocturia    Cancer (HCC)    prostate cancer   Complication of anesthesia    due to heart murmur  mod to severe aortic stenosis   Coronary artery disease    Full dentures    Glaucoma, both eyes    Heart murmur, systolic    Hiatal hernia    History of kidney stones    Hypertension    Left ureteral stone    [place] [place] ([facility]   [facility] [place] [facility]   Peripheral vascular disease ([place]   SIRS (systemic inflammatory response syndrome) (HCWol[place]12/31/2018   hospital admission--  unknown etiology   Type 2 diabetes mellitus (H[facility]   Wears glasses     Past Surgical History:  Procedure Laterality Date   AORTIC VALVE REPLACEMENT     COLONOSCOPY     CORONARY ARTERY BYPASS GRAFT     pt reports Aortic valve replacement and CABG in 2020   CYMarsingURETEROSCOPY AND STENT PLACEMENT Left 02/04/2018   Procedure: CYSTOSCOPY WITH RETROGRADE PYELOGRAM, URETEROSCOPY AND STENT EXCopenhagen Surgeon: MaAlexis FrockMD;  [facility];  Service: Urology;  Laterality: Left;   ENDARTERECTOMY FEMORAL Right 10/02/2021   Procedure: RIGHT COMMON FEMORAL ENDARTERECTOMY WITH 1CM X 14CM XEWeyman Pedro Surgeon: RoBroadus JohnMD;  [facility] Service: Vascular;  Laterality: Right;   EXAM UNDER ANESTHESIA WITH MANIPULATION OF KNEE Left 09/30/2017   Procedure: EXAM UNDER  ANESTHESIA WITH MANIPULATION OF KNEE;  Surgeon: CoSydnee CabalMD;  [facility];  Service: Orthopedics;  Laterality: Left;  30 mins   INSERTION OF ILIAC STENT Right 10/02/2021   Procedure: INSERTION OF RIGHT ILIAC STENT;  Surgeon: RoBroadus JohnMD;  [facility]: [facility] Service: Vascular;  Laterality: Right;   PATCH ANGIOPLASTY Right 10/02/2021   Procedure: PATCH ANGIOPLASTY;  Surgeon: RoBroadus JohnMD;  [facility];  Service: Vascular;  Laterality: Right;   PROSTATE BIOPSY  07/2017   cancerous polyps x2  [place] dr [place]resa [place] 05/28/2017   Procedure: LEFT TOTAL KNEE ARTHROPLASTY;  Surgeon: CoSydnee CabalMD;  [facility];  Service: Orthopedics;  Laterality: Left;   TRANSTHORACIC ECHOCARDIOGRAM  06/02/2017   ef 55-60%/  possible AV bicuspid,  severely thickened and calcified leaflets with severely restricted leaflet  opening, moderate regurg.(valve area 1.17cm^2,  mean gradient 96mHg, peak gradient 519mg)/  mild LAE/  mild TR/  mild MV thickened leaflets and regurg. (not restricted and no stenosis    MEDICATIONS:  acetaminophen (TYLENOL) 500 MG tablet   amLODipine (NORVASC) 5 MG tablet   aspirin 81 MG chewable tablet   atorvastatin (LIPITOR) 80 MG tablet   azelastine (ASTELIN) 0.1 % nasal spray   Brinzolamide-Brimonidine 1-0.2 % SUSP   Carboxymethylcellulose Sod PF 0.5 % SOLN   carvedilol (COREG) 25 MG tablet   cetirizine (ZYRTEC) 10 MG tablet   chlorpheniramine (CHLOR-TRIMETON) 4 MG tablet   cholecalciferol (VITAMIN D3) 25 MCG (1000 UNIT) tablet   ezetimibe (ZETIA) 10 MG tablet   finasteride (PROSCAR) 5 MG tablet   fluticasone (FLONASE) 50 MCG/ACT nasal spray   furosemide (LASIX) 20 MG tablet   insulin glargine-yfgn (SEMGLEE) 100 UNIT/ML Pen   ipratropium (ATROVENT) 0.06 % nasal spray   latanoprost (XALATAN) 0.005 % ophthalmic solution   lisinopril (ZESTRIL) 10 MG tablet   metFORMIN (GLUCOPHAGE) 500 MG tablet    omeprazole (PRILOSEC) 20 MG capsule   oxyCODONE-acetaminophen (PERCOCET/ROXICET) 5-325 MG tablet   Semaglutide,0.25 or 0.'5MG'$ /DOS, 2 MG/1.5ML SOPN   tamsulosin (FLOMAX) 0.4 MG CAPS capsule   No current facility-administered medications for this encounter.    JeKonrad Felixard, PA-C [facility] ([phone]

## 2022-04-08 ENCOUNTER — Encounter (HOSPITAL_COMMUNITY)
Admission: RE | Disposition: A | Discharge status: Home / Self Care | Payer: Self-pay | Source: Home / Self Care | Attending: Specialist

## 2022-04-08 ENCOUNTER — Ambulatory Visit (HOSPITAL_COMMUNITY)
Admission: RE | Admit: 2022-04-08 | Discharge: 2022-04-08 | Disposition: A | Discharge status: Home / Self Care | Payer: No Typology Code available for payment source | Attending: Specialist | Admitting: Specialist

## 2022-04-08 ENCOUNTER — Encounter (HOSPITAL_COMMUNITY): Payer: Self-pay | Admitting: Specialist

## 2022-04-08 DIAGNOSIS — Z539 Procedure and treatment not carried out, unspecified reason: Secondary | ICD-10-CM | POA: Diagnosis not present

## 2022-04-08 DIAGNOSIS — E119 Type 2 diabetes mellitus without complications: Secondary | ICD-10-CM

## 2022-04-08 DIAGNOSIS — Z794 Long term (current) use of insulin: Secondary | ICD-10-CM | POA: Diagnosis not present

## 2022-04-08 DIAGNOSIS — Z01818 Encounter for other preprocedural examination: Secondary | ICD-10-CM

## 2022-04-08 DIAGNOSIS — M1711 Unilateral primary osteoarthritis, right knee: Secondary | ICD-10-CM | POA: Insufficient documentation

## 2022-04-08 LAB — GLUCOSE, CAPILLARY: Glucose-Capillary: 140 mg/dL — ABNORMAL HIGH (ref 70–99)

## 2022-04-08 SURGERY — ARTHROPLASTY, KNEE, TOTAL
Anesthesia: Spinal | Site: Knee | Laterality: Right

## 2022-04-08 MED ORDER — CHLORHEXIDINE GLUCONATE 0.12 % MT SOLN
15.0000 mL | Freq: Once | OROMUCOSAL | Status: AC
  Administered 2022-04-08: 15 mL via OROMUCOSAL

## 2022-04-08 MED ORDER — DEXAMETHASONE SODIUM PHOSPHATE 10 MG/ML IJ SOLN
INTRAMUSCULAR | Status: AC
  Filled 2022-04-08: qty 1

## 2022-04-08 MED ORDER — MIDAZOLAM HCL 2 MG/2ML IJ SOLN
INTRAMUSCULAR | Status: DC | PRN
  Administered 2022-04-08: 1 mg via INTRAVENOUS

## 2022-04-08 MED ORDER — SODIUM CHLORIDE (PF) 0.9 % IJ SOLN
INTRAMUSCULAR | Status: AC
  Filled 2022-04-08: qty 10

## 2022-04-08 MED ORDER — ORAL CARE MOUTH RINSE: 15.0000 mL | Freq: Once | OROMUCOSAL | Status: AC

## 2022-04-08 MED ORDER — CEFAZOLIN SODIUM-DEXTROSE 2-4 GM/100ML-% IV SOLN
2.0000 g | INTRAVENOUS | Status: DC
  Filled 2022-04-08: qty 100

## 2022-04-08 MED ORDER — LACTATED RINGERS IV SOLN: INTRAVENOUS | Status: DC

## 2022-04-08 MED ORDER — SODIUM CHLORIDE (PF) 0.9 % IJ SOLN
INTRAMUSCULAR | Status: AC
  Filled 2022-04-08: qty 50

## 2022-04-08 MED ORDER — BUPIVACAINE LIPOSOME 1.3 % IJ SUSP
INTRAMUSCULAR | Status: AC
  Filled 2022-04-08: qty 20

## 2022-04-08 MED ORDER — BUPIVACAINE LIPOSOME 1.3 % IJ SUSP: 20.0000 mL | Freq: Once | INTRAMUSCULAR | Status: DC

## 2022-04-08 MED ORDER — ROPIVACAINE HCL 7.5 MG/ML IJ SOLN
INTRAMUSCULAR | Status: DC | PRN
  Administered 2022-04-08: 20 mL via PERINEURAL

## 2022-04-08 MED ORDER — ONDANSETRON HCL 4 MG/2ML IJ SOLN
INTRAMUSCULAR | Status: AC
  Filled 2022-04-08: qty 2

## 2022-04-08 MED ORDER — FENTANYL CITRATE (PF) 100 MCG/2ML IJ SOLN
INTRAMUSCULAR | Status: AC
  Filled 2022-04-08: qty 2

## 2022-04-08 MED ORDER — PROPOFOL 10 MG/ML IV BOLUS
INTRAVENOUS | Status: AC
  Filled 2022-04-08: qty 20

## 2022-04-08 MED ORDER — POVIDONE-IODINE 10 % EX SWAB
2.0000 | Freq: Once | CUTANEOUS | Status: AC
  Administered 2022-04-08: 2 via TOPICAL

## 2022-04-08 MED ORDER — STERILE WATER FOR IRRIGATION IR SOLN
Status: DC | PRN
  Administered 2022-04-08: 2000 mL

## 2022-04-08 MED ORDER — MIDAZOLAM HCL 2 MG/2ML IJ SOLN
INTRAMUSCULAR | Status: AC
  Filled 2022-04-08: qty 2

## 2022-04-08 MED ORDER — ACETAMINOPHEN 500 MG PO TABS
1000.0000 mg | ORAL_TABLET | Freq: Once | ORAL | Status: AC
  Administered 2022-04-08: 1000 mg via ORAL
  Filled 2022-04-08: qty 2

## 2022-04-08 MED ORDER — FENTANYL CITRATE (PF) 100 MCG/2ML IJ SOLN
INTRAMUSCULAR | Status: DC | PRN
  Administered 2022-04-08: 50 ug via INTRAVENOUS

## 2022-04-08 MED ORDER — BUPIVACAINE LIPOSOME 1.3 % IJ SUSP
INTRAMUSCULAR | Status: AC
  Filled 2022-04-08: qty 10

## 2022-04-08 MED ORDER — TRANEXAMIC ACID-NACL 1000-0.7 MG/100ML-% IV SOLN
1000.0000 mg | INTRAVENOUS | Status: DC
  Filled 2022-04-08: qty 100

## 2022-04-08 MED ORDER — DEXAMETHASONE SODIUM PHOSPHATE 10 MG/ML IJ SOLN: 8.0000 mg | Freq: Once | INTRAMUSCULAR | Status: DC

## 2022-04-08 SURGICAL SUPPLY — 44 items
BAG COUNTER SPONGE SURGICOUNT (BAG) ×1 IMPLANT
BAG ZIPLOCK 12X15 (MISCELLANEOUS) ×1 IMPLANT
BLADE SAG 18X100X1.27 (BLADE) ×1 IMPLANT
BLADE SAW SGTL 11.0X1.19X90.0M (BLADE) ×1 IMPLANT
BNDG ELASTIC 4X5.8 VLCR STR LF (GAUZE/BANDAGES/DRESSINGS) ×1 IMPLANT
BNDG ELASTIC 6X5.8 VLCR STR LF (GAUZE/BANDAGES/DRESSINGS) ×1 IMPLANT
BOWL SMART MIX CTS (DISPOSABLE) ×1 IMPLANT
COVER SURGICAL LIGHT HANDLE (MISCELLANEOUS) ×1 IMPLANT
CUFF TOURN SGL QUICK 34 (TOURNIQUET CUFF) ×1
CUFF TRNQT CYL 34X4.125X (TOURNIQUET CUFF) ×1 IMPLANT
DERMABOND ADVANCED .7 DNX12 (GAUZE/BANDAGES/DRESSINGS) ×1 IMPLANT
DRAPE INCISE IOBAN 66X45 STRL (DRAPES) ×1 IMPLANT
DRAPE U-SHAPE 47X51 STRL (DRAPES) ×1 IMPLANT
DRSG AQUACEL AG ADV 3.5X10 (GAUZE/BANDAGES/DRESSINGS) ×1 IMPLANT
DURAPREP 26ML APPLICATOR (WOUND CARE) ×2 IMPLANT
ELECT REM PT RETURN 15FT ADLT (MISCELLANEOUS) ×1 IMPLANT
GLOVE BIOGEL PI IND STRL 7.5 (GLOVE) ×2 IMPLANT
GLOVE BIOGEL PI IND STRL 8 (GLOVE) ×2 IMPLANT
GLOVE ECLIPSE 8.0 STRL XLNG CF (GLOVE) ×2 IMPLANT
GLOVE SURG ORTHO 9.0 STRL STRW (GLOVE) ×2 IMPLANT
GLOVE SURG SS PI 7.0 STRL IVOR (GLOVE) ×2 IMPLANT
GOWN SPEC L4 XLG W/TWL (GOWN DISPOSABLE) ×2 IMPLANT
HANDPIECE INTERPULSE COAX TIP (DISPOSABLE) ×1
KIT TURNOVER KIT A (KITS) IMPLANT
NS IRRIG 1000ML POUR BTL (IV SOLUTION) ×1 IMPLANT
PACK TOTAL KNEE CUSTOM (KITS) ×1 IMPLANT
PROTECTOR NERVE ULNAR (MISCELLANEOUS) ×1 IMPLANT
SET HNDPC FAN SPRY TIP SCT (DISPOSABLE) ×1 IMPLANT
SET PAD KNEE POSITIONER (MISCELLANEOUS) ×1 IMPLANT
SOLUTION PRONTOSAN WOUND 350ML (IRRIGATION / IRRIGATOR) ×1 IMPLANT
SPIKE FLUID TRANSFER (MISCELLANEOUS) ×1 IMPLANT
SPONGE SURGIFOAM ABS GEL 100 (HEMOSTASIS) ×1 IMPLANT
STOCKINETTE 6  STRL (DRAPES) ×1
STOCKINETTE 6 STRL (DRAPES) ×1 IMPLANT
SUT MNCRL AB 3-0 PS2 18 (SUTURE) ×1 IMPLANT
SUT VIC AB 1 CT1 27 (SUTURE) ×3
SUT VIC AB 1 CT1 27XBRD ANTBC (SUTURE) ×3 IMPLANT
SUT VIC AB 2-0 CT1 27 (SUTURE) ×2
SUT VIC AB 2-0 CT1 TAPERPNT 27 (SUTURE) ×2 IMPLANT
SUT VLOC 180 0 24IN GS25 (SUTURE) ×1 IMPLANT
SYR 50ML LL SCALE MARK (SYRINGE) ×1 IMPLANT
TAPE STRIPS DRAPE STRL (GAUZE/BANDAGES/DRESSINGS) ×1 IMPLANT
TRAY CATH INTERMITTENT SS 16FR (CATHETERS) ×1 IMPLANT
WATER STERILE IRR 1000ML POUR (IV SOLUTION) ×2 IMPLANT

## 2022-04-08 NOTE — H&P (Signed)
TOTAL KNEE ADMISSION H&P  Patient is being admitted for right total knee arthroplasty.  Subjective:  Chief Complaint:right knee pain.  HPI: Jeremy Nicholson, 76 y.o. male, has a history of pain and functional disability in the right knee due to arthritis and has failed non-surgical conservative treatments for greater than 12 weeks to includeNSAID's and/or analgesics, corticosteriod injections, and activity modification.  Onset of symptoms was gradual, starting 2 years ago with gradually worsening course since that time. The patient noted no past surgery on the right knee(s).  Patient currently rates pain in the right knee(s) at 6 out of 10 with activity. Patient has night pain, worsening of pain with activity and weight bearing, pain that interferes with activities of daily living, pain with passive range of motion, and joint swelling.  Patient has evidence of periarticular osteophytes and joint space narrowing by imaging studies. This patient has had  no previous injury . There is no active infection.  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   Elevated blood-pressure reading without diagnosis of hypertension 11/15/2020   Other specified counseling 11/15/2020   Gastroesophageal reflux disease without esophagitis 11/15/2020   Unspecified open-angle glaucoma, moderate stage 11/15/2020   Reason for consultation 11/15/2020   CAD in native artery 02/27/2019   Arthrofibrosis of total knee arthroplasty, sequela 09/30/2017   SIRS (systemic inflammatory response syndrome) ([place]) 05/31/2017   DM2 (diabetes mellitus, type 2) ([place]) 05/31/2017   HTN (hypertension) 05/31/2017   S/P knee replacement 05/28/2017    Past Medical History:  Diagnosis Date   Aortic valve stenosis, moderate    per echo 06-02-2017 (in epic)  ef 55-60%,  possible bicuspid AV,  severeely thickened and calcified with severely restricted leaflet opening,  valve area 1.17cm^2, mean gradient 55mHg, peak gradient 559mg,  moderate AR   Arthritis    oa   BPH associated with nocturia    Cancer ([place]   prostate cancer   Complication of anesthesia    due to heart murmur  mod to severe aortic stenosis   Coronary artery disease    Full dentures    Glaucoma, both eyes    Heart murmur, systolic    Hiatal hernia    History of kidney stones    Hypertension    Left ureteral stone    [facility] [place]   [facility] [place] [facility]   Peripheral vascular disease (HCC[place]   SIRS (systemic inflammatory response syndrome) (HCBliss12/31/2018   hospital admission--  unknown etiology   Type 2 diabetes mellitus ([place]CR[place]   Wears glasses     Past Surgical History:  Procedure Laterality Date   AORTIC VALVE REPLACEMENT     COLONOSCOPY     CORONARY ARTERY BYPASS GRAFT     pt reports Aortic valve replacement and CABG in 2020   CYMaupinURETEROSCOPY AND STENT PLACEMENT Left 02/04/2018   Procedure: CYSTOSCOPY WITH RETROGRADE PYELOGRAM, URETEROSCOPY AND STENT EXReeds [place] Surgeon: MaAlexis FrockMD;  [facility];  Service: Urology;  Laterality: Left;   ENDARTERECTOMY FEMORAL Right 10/02/2021   Procedure: RIGHT COMMON FEMORAL ENDARTERECTOMY WITH 1CM X 14CM XEWeyman Pedro Surgeon: RoBroadus JohnMD;  [facility];  Service: Vascular;  Laterality: Right;   EXAM UNDER ANESTHESIA WITH MANIPULATION OF KNEE Left 09/30/2017   Procedure: EXAM UNDER ANESTHESIA WITH MANIPULATION OF KNEE;  Surgeon: [name], MD;  [facility];  Service: Orthopedics;  Laterality: Left;  30 mins   INSERTION OF ILIAC STENT Right 10/02/2021   Procedure: INSERTION OF RIGHT  ILIAC STENT;  Surgeon: [name], MD;  Location: [place];  Service: Vascular;  Laterality: Right;   PATCH ANGIOPLASTY Right 10/02/2021   Procedure: PATCH ANGIOPLASTY;  Surgeon: [name], MD;  [facility];  Service: Vascular;  Laterality: Right;   PROSTATE BIOPSY  07/2017   cancerous polyps x2  watching psa dr Tresa Moore at [place] Left 05/28/2017   Procedure: LEFT TOTAL KNEE ARTHROPLASTY;  Surgeon: [name], MD;  [facility];  Service: Orthopedics;  Laterality: Left;   TRANSTHORACIC ECHOCARDIOGRAM  06/02/2017   ef 55-60%/  possible AV bicuspid,  severely thickened and calcified leaflets with severely restricted leaflet opening, moderate regurg.(valve area 1.17cm^2,  mean gradient 18mHg, peak gradient 523mg)/  mild LAE/  mild TR/  mild MV thickened leaflets and regurg. (not restricted and no stenosis    Current Facility-Administered Medications  Medication Dose Route Frequency Provider Last Rate Last Admin   bupivacaine liposome (EXPAREL) 1.3 % injection 266 mg  20 mL Other Once Nike Southwell R, PA       ceFAZolin (ANCEF) IVPB 2g/100 mL premix  2 g Intravenous On Call to OR Brydan Downard R, PA       dexamethasone (DECADRON) injection 8 mg  8 mg Intravenous Once Vyron Fronczak R, PA       lactated ringers infusion   Intravenous Continuous Chantrell Apsey R, PA       lactated ringers infusion   Intravenous Continuous JaAnnye AsaMD 10 mL/hr at 04/08/22 085035ontinued from Pre-op at 04/08/22 [id number] tranexamic acid (CYKLOKAPRON) IVPB 1,000 mg  1,000 mg Intravenous To OR Avis Mcmahill R, PA       Facility-Administered Medications Ordered in Other Encounters  Medication Dose Route Frequency Provider Last Rate Last Admin   fentaNYL (SUBLIMAZE) injection   Intravenous Anesthesia Intra-op Good, Melanie B, CRNA   50 mcg at 04/08/22 0819   midazolam (VERSED) injection   Intravenous Anesthesia Intra-op GoJefm Miles, CRNA   1 mg at 04/08/22 088127  Allergies  Allergen Reactions   Atorvastatin Other (See Comments)    Muscle pain   Chlorthalidone Other (See Comments)    Pt unsure of reaction   Pravastatin Other (See Comments)    Muscle pain   Latex Hives   Lisinopril Hives   Rosuvastatin Other (See Comments)    Muscle pain   Tape Hives    Latex tape    Social History   Tobacco Use   Smoking status: Former    Years: 30.00    Types: Cigarettes    Quit date: 01/28/1995    Years since quitting: 27.2    Passive exposure: Never   Smokeless tobacco: Never  Substance Use Topics   Alcohol use: No    History reviewed. No pertinent family history.   Review of Systems  All other systems reviewed and are negative.   Objective:  Physical Exam Constitutional:      Appearance: Normal appearance.  HENT:     Head: Normocephalic and atraumatic.  Neck:     Vascular: No carotid bruit.  Cardiovascular:  Rate and Rhythm: Normal rate and regular rhythm.     Pulses: Normal pulses.     Heart sounds: Normal heart sounds. No murmur heard.    No friction rub. No gallop.  Pulmonary:     Effort: Pulmonary effort is normal. No respiratory distress.     Breath sounds: Normal breath sounds. No stridor. No wheezing, rhonchi or rales.  Chest:     Chest wall: No tenderness.  Musculoskeletal:        General: Swelling and tenderness (medial and lateral joint line) present.     Cervical back: Normal range of motion and neck supple. No rigidity or tenderness.  Lymphadenopathy:     Cervical: No cervical adenopathy.  Skin:    General: Skin is warm and dry.     Capillary Refill: Capillary refill takes less than 2 seconds.  Neurological:     General: No focal deficit present.     Mental Status: He is alert and oriented to person, place, and time.  Psychiatric:        Mood and Affect: Mood normal.        Behavior: Behavior normal.        Thought Content: Thought content normal.        Judgment: Judgment normal.     Vital signs in  last 24 hours: Temp:  [97.6 F (36.4 C)] 97.6 F (36.4 C) (11/08 0645) Pulse Rate:  [69] 69 (11/08 0645) Resp:  [16] 16 (11/08 0645) BP: (137)/(67) 137/67 (11/08 0645) SpO2:  [96 %] 96 % (11/08 0645) Weight:  [82.3 kg] 82.3 kg (11/08 0647)  Labs:   Estimated body mass index is 26.79 kg/m as calculated from the following:   Height as of this encounter: '5\' 9"'$  (1.753 m).   Weight as of this encounter: 82.3 kg.   Imaging Review Plain radiographs demonstrate moderate degenerative joint disease of the right knee(s). The overall alignment issignificant varus. The bone quality appears to be good for age and reported activity level.      Assessment/Plan:  End stage arthritis, right knee   The patient history, physical examination, clinical judgment of the provider and imaging studies are consistent with end stage degenerative joint disease of the right knee(s) and total knee arthroplasty is deemed medically necessary. The treatment options including medical management, injection therapy arthroscopy and arthroplasty were discussed at length. The risks and benefits of total knee arthroplasty were presented and reviewed. The risks due to aseptic loosening, infection, stiffness, patella tracking problems, thromboembolic complications and other imponderables were discussed. The patient acknowledged the explanation, agreed to proceed with the plan and consent was signed. Patient is being admitted for inpatient treatment for surgery, pain control, PT, OT, prophylactic antibiotics, VTE prophylaxis, progressive ambulation and ADL's and discharge planning. The patient is planning to be discharged  home with out patient PT     Patient's anticipated LOS is less than 2 midnights, meeting these requirements: - Younger than 97 - Lives within 1 hour of care - Has a competent adult at home to recover with post-op recover - NO history of  - Chronic pain requiring opiods  - Diabetes  - Coronary Artery  Disease  - Heart failure  - Heart attack  - Stroke  - DVT/VTE  - Cardiac arrhythmia  - Respiratory Failure/COPD  - Renal failure  - Anemia  - Advanced Liver disease

## 2022-04-08 NOTE — Anesthesia Procedure Notes (Signed)
Anesthesia Regional Block: Adductor canal block   Pre-Anesthetic Checklist: , timeout performed,  Correct Patient, Correct Site, Correct Laterality,  Correct Procedure, Correct Position, site marked,  Risks and benefits discussed,  Surgical consent,  Pre-op evaluation,  At surgeon's request and post-op pain management  Laterality: Right and Lower  Prep: chloraprep       Needles:  Injection technique: Single-shot  Needle Type: Echogenic Needle     Needle Length: 9cm  Needle Gauge: 21     Additional Needles:   Procedures:,,,, ultrasound used (permanent image in chart),,    Narrative:  Start time: 04/08/2022 7:53 AM End time: 04/08/2022 7:59 AM Injection made incrementally with aspirations every 5 mL.  Performed by: Personally  Anesthesiologist: Annye Asa, MD  Additional Notes: Pt identified in Holding room.  Monitors applied. Working IV access confirmed. Sterile prep R thigh.  #21ga ECHOgenic Arrow block needle into adductor canal with US guidance.  20cc 0.75% Ropivacaine injected incrementally after negative test dose.  Patient asymptomatic, VSS, no heme aspirated, tolerated well.   Jenita Seashore, MD

## 2022-05-01 DIAGNOSIS — N1831 Chronic kidney disease, stage 3a: Secondary | ICD-10-CM | POA: Diagnosis not present

## 2022-05-01 DIAGNOSIS — I700 Atherosclerosis of aorta: Secondary | ICD-10-CM | POA: Diagnosis not present

## 2022-05-01 DIAGNOSIS — I739 Peripheral vascular disease, unspecified: Secondary | ICD-10-CM | POA: Diagnosis not present

## 2022-05-01 DIAGNOSIS — N400 Enlarged prostate without lower urinary tract symptoms: Secondary | ICD-10-CM | POA: Diagnosis not present

## 2022-05-01 DIAGNOSIS — E785 Hyperlipidemia, unspecified: Secondary | ICD-10-CM | POA: Diagnosis not present

## 2022-05-01 DIAGNOSIS — I10 Essential (primary) hypertension: Secondary | ICD-10-CM | POA: Diagnosis not present

## 2022-05-01 DIAGNOSIS — H409 Unspecified glaucoma: Secondary | ICD-10-CM | POA: Diagnosis not present

## 2022-05-01 DIAGNOSIS — E1122 Type 2 diabetes mellitus with diabetic chronic kidney disease: Secondary | ICD-10-CM | POA: Diagnosis not present

## 2022-05-01 DIAGNOSIS — Z0000 Encounter for general adult medical examination without abnormal findings: Secondary | ICD-10-CM | POA: Diagnosis not present

## 2022-05-26 DIAGNOSIS — R972 Elevated prostate specific antigen [PSA]: Secondary | ICD-10-CM | POA: Diagnosis not present

## 2022-05-26 DIAGNOSIS — C61 Malignant neoplasm of prostate: Secondary | ICD-10-CM | POA: Diagnosis not present

## 2022-06-02 DIAGNOSIS — R338 Other retention of urine: Secondary | ICD-10-CM | POA: Diagnosis not present

## 2022-06-02 DIAGNOSIS — C61 Malignant neoplasm of prostate: Secondary | ICD-10-CM | POA: Diagnosis not present

## 2022-06-02 DIAGNOSIS — N5201 Erectile dysfunction due to arterial insufficiency: Secondary | ICD-10-CM | POA: Diagnosis not present

## 2022-06-08 DIAGNOSIS — R8271 Bacteriuria: Secondary | ICD-10-CM | POA: Diagnosis not present

## 2022-06-08 DIAGNOSIS — N3000 Acute cystitis without hematuria: Secondary | ICD-10-CM | POA: Diagnosis not present

## 2022-06-08 NOTE — Progress Notes (Signed)
Sent message, via epic in basket, requesting order in epic from surgeon    06/08/22 1338  Preop Orders  Has preop orders? No  Name of staff/physician contacted for orders(Indicate phone or IB message) hill, avery, PA-C.

## 2022-06-09 ENCOUNTER — Ambulatory Visit: Payer: Self-pay | Admitting: Student

## 2022-06-09 DIAGNOSIS — E119 Type 2 diabetes mellitus without complications: Secondary | ICD-10-CM

## 2022-06-12 NOTE — Progress Notes (Signed)
COVID Vaccine received:  _0  No _1  Yes Date of any COVID positive Test in last 90 days:  PCP - [name], MD and L[facility] L[facility], [facility]  Chest x-ray - 01-19-2022  Epic EKG -  10-02-2021    Epic Stress Test -  ECHO - 2019  Epic Cardiac Cath -   PCR screen: _2  Ordered & Completed                      _3   No Order but Needs PROFEND                      _4   N/A for this surgery  Surgery Plan:  _5  Ambulatory                            _6  Outpatient in bed                            _7  Admit  Anesthesia:    _8  General  _9  Spinal                           _10   Choice _11   MAC  Pacemaker / ICD device _12  No _13  Yes        Device order form faxed _14  No    _15   Yes      Faxed to:  Spinal Cord Stimulator:_16  No _17  Yes      (Remind patient to bring remote DOS) Other Implants:   History of Sleep Apnea? _18  No _19  Yes Patient had negative sleep study CPAP used?- _20  No _21  Yes     Does the patient monitor blood sugar? _22  No _23  Yes  _24  N/A Does patient have a [employer] or Dexacom? _25  No _26  Yes   Fasting Blood Sugar Ranges- 120 to 140 Checks Blood Sugar __1__ times a day  Last dose of GLP1 agonist-  GLP1 instructions:  Last dose of SGLT-2 inhibitors-  SGLT-2 instructions:   Blood Thinner / Instructions: none Aspirin Instructions:ASA 81 mg   Patient told to stay on ASA, no Hold  ERAS Protocol Ordered: _27  No  _28  Yes PRE-SURGERY _29  ENSURE  _30  G2  _31  No Drink Ordered  Patient is to be NPO after:   Comments: Patient had Right iliofemoral endarterectomy, Iliac stent and patch angioplasty 10-02-2021 by Dr. Virl Cagey at [facility].     Can go up a flight of stairs and perform activities of daily living without stopping and without symptoms of chest pain or shortness of breath.   Anesthesia review:   Patient denies shortness of breath, fever, cough and chest pain at PAT appointment.  Patient verbalized understanding  and agreement to the Pre-Surgical Instructions that were given to them at this PAT appointment. Patient was also educated of the need to review these PAT instructions again prior to his/her surgery.I reviewed the appropriate phone numbers to call if they have any and questions or concerns.

## 2022-06-13 NOTE — Patient Instructions (Signed)
SURGICAL WAITING ROOM VISITATION Patients having surgery or a procedure may have no more than 2 support people in the waiting area - these visitors may rotate in the visitor waiting room.   Due to an increase in RSV and influenza rates and associated hospitalizations, children ages 5 and under may not visit patients in [place]. If the patient needs to stay at the hospital during part of their recovery, the visitor guidelines for inpatient rooms apply.  PRE-OP VISITATION  Pre-op nurse will coordinate an appropriate time for 1 support person to accompany the patient in pre-op.  This support person may not rotate.  This visitor will be contacted when the time is appropriate for the visitor to come back in the pre-op area.  Please refer to the [facility] website for the visitor guidelines for Inpatients (after your surgery is over and you are in a regular room).  You are not required to quarantine at this time prior to your surgery. However, you must do this: Hand Hygiene often Do NOT share personal items Notify your provider if you are in close contact with someone who has COVID or you develop fever 100.4 or greater, new onset of sneezing, cough, sore throat, shortness of breath or body aches.  If you test positive for Covid or have been in contact with anyone that has tested positive in the last 10 days please notify you surgeon.    Your procedure is scheduled on:  Thursday   June 25, 2022    Report to [facility] Main Entrance: [place] entrance where the [employer] is available.   Report to admitting at: 11:00 AM  +++++Call this number if you have any questions or problems the morning of surgery [phone]  Do not eat food after Midnight the night prior to your surgery/procedure.  After Midnight you may have the following liquids until  10:30 AM DAY OF SURGERY  Clear Liquid Diet Water Black Coffee (sugar ok, NO MILK/CREAM OR CREAMERS)  Tea (sugar ok,  NO MILK/CREAM OR CREAMERS) regular and decaf                             Plain Jell-O  with no fruit (NO RED)                                           Fruit ices (not with fruit pulp, NO RED)                                     Popsicles (NO RED)                                                                  Juice: apple, WHITE grape, WHITE cranberry Sports drinks like Gatorade or Powerade (NO RED)                    The day of surgery:  Drink ONE (1) Pre-Surgery  G2 at 10:30 AM the morning of surgery. Drink in one sitting. Do  not sip.  This drink was given to you during your hospital pre-op appointment visit. Nothing else to drink after completing the Pre-Surgery G2 : No candy, chewing gum or throat lozenges.    FOLLOW ANY ADDITIONAL PRE OP INSTRUCTIONS YOU RECEIVED FROM YOUR SURGEON'S OFFICE!!!   Oral Hygiene is also important to reduce your risk of infection.        Remember - BRUSH YOUR TEETH THE MORNING OF SURGERY WITH YOUR REGULAR TOOTHPASTE  Do NOT smoke after Midnight the night before surgery.  Take ONLY these medicines the morning of surgery with A SIP OF WATER: Carvedilol, amlodipine, finasteride, omeprazole. You may take Tylenol if needed form pain   If You have been diagnosed with Sleep Apnea - Bring CPAP mask and tubing day of surgery. We will provide you with a CPAP machine on the day of your surgery.                   You may not have any metal on your body including  jewelry, and body piercing  Do not wear lotions, powders, cologne, or deodorant  Men may shave face and neck.  Contacts, Hearing Aids, dentures or bridgework may not be worn into surgery. DENTURES WILL BE REMOVED PRIOR TO SURGERY PLEASE DO NOT APPLY "Poly grip" OR ADHESIVES!!!  You may bring a small overnight bag with you on the day of surgery, only pack items that are not valuable. [facility] IS NOT RESPONSIBLE   FOR VALUABLES THAT ARE LOST OR STOLEN.   Do not bring your home medications to the  hospital. The Pharmacy will dispense medications listed on your medication list to you during your admission in the Hospital.  Special Instructions: Bring a copy of your healthcare power of attorney and living will documents the day of surgery, if you wish to have them scanned into your [facility] Records- EPIC  Please read over the following fact sheets you were given: IF YOU HAVE QUESTIONS ABOUT YOUR PRE-OP INSTRUCTIONS, PLEASE CALL [phone]  ([place])   [facility] - Preparing for Surgery Before surgery, you can play an important role.  Because skin is not sterile, your skin needs to be as free of germs as possible.  You can reduce the number of germs on your skin by washing with CHG (chlorahexidine gluconate) soap before surgery.  CHG is an antiseptic cleaner which kills germs and bonds with the skin to continue killing germs even after washing. Please DO NOT use if you have an allergy to CHG or antibacterial soaps.  If your skin becomes reddened/irritated stop using the CHG and inform your nurse when you arrive at Short Stay. Do not shave (including legs and underarms) for at least 48 hours prior to the first CHG shower.  You may shave your face/neck.  Please follow these instructions carefully:  1.  Shower with CHG Soap the night before surgery and the  morning of surgery.  2.  If you choose to wash your hair, wash your hair first as usual with your normal  shampoo.  3.  After you shampoo, rinse your hair and body thoroughly to remove the shampoo.                             4.  Use CHG as you would any other liquid soap.  You can apply chg directly to the skin and wash.  Gently with a scrungie or clean washcloth.  5.  Apply the CHG Soap to your body ONLY FROM THE NECK DOWN.   Do not use on face/ open                           Wound or open sores. Avoid contact with eyes, ears mouth and genitals (private parts).                       Wash face,  Genitals (private parts) with your  normal soap.             6.  Wash thoroughly, paying special attention to the area where your  surgery  will be performed.  7.  Thoroughly rinse your body with warm water from the neck down.  8.  DO NOT shower/wash with your normal soap after using and rinsing off the CHG Soap.            9.  Pat yourself dry with a clean towel.            10.  Wear clean pajamas.            11.  Place clean sheets on your bed the night of your first shower and do not  sleep with pets.  ON THE DAY OF SURGERY : Do not apply any lotions/deodorants the morning of surgery.  Please wear clean clothes to the hospital/surgery center.    FAILURE TO FOLLOW THESE INSTRUCTIONS MAY RESULT IN THE CANCELLATION OF YOUR SURGERY  PATIENT SIGNATURE_________________________________  NURSE SIGNATURE__________________________________  ________________________________________________________________________        [name]    An incentive spirometer is a tool that can help keep your lungs clear and active. This tool measures how well you are filling your lungs with each breath. Taking long deep breaths may help reverse or decrease the chance of developing breathing (pulmonary) problems (especially infection) following: A long period of time when you are unable to move or be active. BEFORE THE PROCEDURE  If the spirometer includes an indicator to show your best effort, your nurse or respiratory therapist will set it to a desired goal. If possible, sit up straight or lean slightly forward. Try not to slouch. Hold the incentive spirometer in an upright position. INSTRUCTIONS FOR USE  Sit on the edge of your bed if possible, or sit up as far as you can in bed or on a chair. Hold the incentive spirometer in an upright position. Breathe out normally. Place the mouthpiece in your mouth and seal your lips tightly around it. Breathe in slowly and as deeply as possible, raising the piston or the ball toward the  top of the column. Hold your breath for 3-5 seconds or for as long as possible. Allow the piston or ball to fall to the bottom of the column. Remove the mouthpiece from your mouth and breathe out normally. Rest for a few seconds and repeat Steps 1 through 7 at least 10 times every 1-2 hours when you are awake. Take your time and take a few normal breaths between deep breaths. The spirometer may include an indicator to show your best effort. Use the indicator as a goal to work toward during each repetition. After each set of 10 deep breaths, practice coughing to be sure your lungs are clear. If you have an incision (the cut made at the time of surgery), support your incision when coughing by placing a pillow or rolled up  towels firmly against it. Once you are able to get out of bed, walk around indoors and cough well. You may stop using the incentive spirometer when instructed by your caregiver.  RISKS AND COMPLICATIONS Take your time so you do not get dizzy or light-headed. If you are in pain, you may need to take or ask for pain medication before doing incentive spirometry. It is harder to take a deep breath if you are having pain. AFTER USE Rest and breathe slowly and easily. It can be helpful to keep track of a log of your progress. Your caregiver can provide you with a simple table to help with this. If you are using the spirometer at home, follow these instructions: [place] IF:  You are having difficultly using the spirometer. You have trouble using the spirometer as often as instructed. Your pain medication is not giving enough relief while using the spirometer. You develop fever of 100.5 F (38.1 C) or higher.                                                                                                    SEEK IMMEDIATE MEDICAL CARE IF:  You cough up bloody sputum that had not been present before. You develop fever of 102 F (38.9 C) or greater. You develop worsening pain  at or near the incision site. MAKE SURE YOU:  Understand these instructions. Will watch your condition. Will get help right away if you are not doing well or get worse. Document Released: 09/28/2006 Document Revised: 08/10/2011 Document Reviewed: 11/29/2006 [facility] Patient Information 2014 [place], [place].

## 2022-06-15 ENCOUNTER — Encounter (HOSPITAL_COMMUNITY): Payer: Self-pay

## 2022-06-15 ENCOUNTER — Other Ambulatory Visit: Payer: Self-pay

## 2022-06-15 ENCOUNTER — Encounter (HOSPITAL_COMMUNITY)
Admission: RE | Admit: 2022-06-15 | Discharge: 2022-06-15 | Disposition: A | Discharge status: Home / Self Care | Payer: No Typology Code available for payment source | Source: Ambulatory Visit | Attending: Orthopedic Surgery | Admitting: Orthopedic Surgery

## 2022-06-15 VITALS — BP 115/62 | HR 67 | Temp 97.9°F | Resp 16 | Ht 69.0 in | Wt 181.0 lb

## 2022-06-15 DIAGNOSIS — E119 Type 2 diabetes mellitus without complications: Secondary | ICD-10-CM | POA: Diagnosis not present

## 2022-06-15 DIAGNOSIS — Z794 Long term (current) use of insulin: Secondary | ICD-10-CM | POA: Diagnosis not present

## 2022-06-15 DIAGNOSIS — Z01812 Encounter for preprocedural laboratory examination: Secondary | ICD-10-CM | POA: Diagnosis present

## 2022-06-15 DIAGNOSIS — Z01818 Encounter for other preprocedural examination: Secondary | ICD-10-CM

## 2022-06-15 DIAGNOSIS — I2510 Atherosclerotic heart disease of native coronary artery without angina pectoris: Secondary | ICD-10-CM | POA: Diagnosis not present

## 2022-06-15 LAB — CBC
HCT: 42.6 % (ref 39.0–52.0)
Hemoglobin: 13.5 g/dL (ref 13.0–17.0)
MCH: 29.4 pg (ref 26.0–34.0)
MCHC: 31.7 g/dL (ref 30.0–36.0)
MCV: 92.8 fL (ref 80.0–100.0)
Platelets: 231 10*3/uL (ref 150–400)
RBC: 4.59 MIL/uL (ref 4.22–5.81)
RDW: 13.5 % (ref 11.5–15.5)
WBC: 4.9 10*3/uL (ref 4.0–10.5)
nRBC: 0 % (ref 0.0–0.2)

## 2022-06-15 LAB — HEMOGLOBIN A1C
Hgb A1c MFr Bld: 8.5 % — ABNORMAL HIGH (ref 4.8–5.6)
Mean Plasma Glucose: 197.25 mg/dL

## 2022-06-15 LAB — BASIC METABOLIC PANEL
Anion gap: 7 (ref 5–15)
BUN: 18 mg/dL (ref 8–23)
CO2: 23 mmol/L (ref 22–32)
Calcium: 9.3 mg/dL (ref 8.9–10.3)
Chloride: 106 mmol/L (ref 98–111)
Creatinine, Ser: 1.25 mg/dL — ABNORMAL HIGH (ref 0.61–1.24)
GFR, Estimated: >60 mL/min (ref >60)
Glucose, Bld: 157 mg/dL — ABNORMAL HIGH (ref 70–99)
Potassium: 5 mmol/L (ref 3.5–5.1)
Sodium: 136 mmol/L (ref 135–145)

## 2022-06-15 LAB — SURGICAL PCR SCREEN
MRSA, PCR: NEGATIVE
Staphylococcus aureus: NEGATIVE

## 2022-06-25 ENCOUNTER — Ambulatory Visit (HOSPITAL_COMMUNITY)
Admission: RE | Admit: 2022-06-25 | Payer: No Typology Code available for payment source | Source: Ambulatory Visit | Admitting: Orthopedic Surgery

## 2022-06-25 ENCOUNTER — Encounter (HOSPITAL_COMMUNITY): Admission: RE | Payer: Self-pay | Source: Ambulatory Visit

## 2022-06-25 SURGERY — ARTHROPLASTY, KNEE, TOTAL, USING IMAGELESS COMPUTER-ASSISTED NAVIGATION
Anesthesia: Spinal | Site: Knee | Laterality: Right

## 2022-09-25 ENCOUNTER — Ambulatory Visit: Payer: Medicare PPO | Admitting: Physician Assistant

## 2022-09-25 ENCOUNTER — Ambulatory Visit (HOSPITAL_COMMUNITY)
Admission: RE | Admit: 2022-09-25 | Discharge: 2022-09-25 | Disposition: A | Discharge status: Home / Self Care | Payer: Medicare PPO | Source: Ambulatory Visit | Attending: Vascular Surgery | Admitting: Vascular Surgery

## 2022-09-25 ENCOUNTER — Ambulatory Visit (INDEPENDENT_AMBULATORY_CARE_PROVIDER_SITE_OTHER)
Admission: RE | Admit: 2022-09-25 | Discharge: 2022-09-25 | Disposition: A | Discharge status: Home / Self Care | Payer: Medicare PPO | Source: Ambulatory Visit | Attending: Vascular Surgery | Admitting: Vascular Surgery

## 2022-09-25 VITALS — BP 130/73 | HR 74 | Temp 97.4°F | Resp 16 | Ht 69.0 in | Wt 175.0 lb

## 2022-09-25 DIAGNOSIS — I739 Peripheral vascular disease, unspecified: Secondary | ICD-10-CM

## 2022-09-25 DIAGNOSIS — I70221 Atherosclerosis of native arteries of extremities with rest pain, right leg: Secondary | ICD-10-CM | POA: Diagnosis not present

## 2022-09-25 LAB — VAS US ABI WITH/WO TBI
Left ABI: 0.63
Right ABI: 0.6

## 2022-09-25 NOTE — Progress Notes (Signed)
Office Note     CC:  follow up Requesting Provider:  Deatra James, MD  HPI: Roshon Duell is a 77 y.o. (11-Jul-1945) male who presents for surveillance of PAD.  He has history of right external iliac artery stenting, iliofemoral endarterectomy, and bovine patch angioplasty by Dr. Karin Lieu on 10/02/2021.  This was performed due to rest pain of the right foot.  He has known bilateral SFA occlusions.  Patient however does not have any claudication.  He is also without rest pain or tissue loss of bilateral lower extremities.  He continues to take an aspirin and statin daily.  He is a former smoker.  He would like to have a knee replacement soon with Dr. Linna Caprice.   Past Medical History:  Diagnosis Date   Aortic valve stenosis, moderate    per echo 06-02-2017 (in epic)  ef 55-60%,  possible bicuspid AV,  severeely thickened and calcified with severely restricted leaflet opening,  valve area 1.17cm^2, mean gradient , peak gradient ,  moderate AR   Arthritis    oa   BPH associated with nocturia    Cancer (HCC)    prostate cancer   Complication of anesthesia    due to heart murmur  mod to severe aortic stenosis   Coronary artery disease    Full dentures    Glaucoma, both eyes    Heart murmur, systolic    History of kidney stones    Hypertension    Left ureteral stone    Myocardial infarction (HCC)    mild in 11-2017 at [facility] F/U from [place] on chart   Peripheral vascular disease (HCC)    SIRS (systemic inflammatory response syndrome) (HCC) 05/31/2017   hospital admission--  unknown etiology   Type 2 diabetes mellitus (HCC)    Wears glasses     Past Surgical History:  Procedure Laterality Date   AORTIC VALVE REPLACEMENT     COLONOSCOPY     CORONARY ARTERY BYPASS GRAFT     pt reports Aortic valve replacement and CABG in 2020   CYSTOSCOPY WITH RETROGRADE PYELOGRAM, URETEROSCOPY AND STENT PLACEMENT Left 02/04/2018   Procedure: CYSTOSCOPY WITH RETROGRADE PYELOGRAM,  URETEROSCOPY AND STENT EXCHANGE,BASKETING OF STONE;  Surgeon: Sebastian Ache, MD;  Location: [facility];  Service: Urology;  Laterality: Left;   ENDARTERECTOMY FEMORAL Right 10/02/2021   Procedure: RIGHT COMMON FEMORAL ENDARTERECTOMY WITH 1CM X 14CM [name];  Surgeon: Victorino Sparrow, MD;  Location: [facility] OR;  Service: Vascular;  Laterality: Right;   EXAM UNDER ANESTHESIA WITH MANIPULATION OF KNEE Left 09/30/2017   Procedure: EXAM UNDER ANESTHESIA WITH MANIPULATION OF KNEE;  Surgeon: Eugenia Mcalpine, MD;  Location: [facility];  Service: Orthopedics;  Laterality: Left;  30 mins   INSERTION OF ILIAC STENT Right 10/02/2021   Procedure: INSERTION OF RIGHT ILIAC STENT;  Surgeon: Victorino Sparrow, MD;  Location: [facility] OR;  Service: Vascular;  Laterality: Right;   PATCH ANGIOPLASTY Right 10/02/2021   Procedure: PATCH ANGIOPLASTY;  Surgeon: Victorino Sparrow, MD;  Location: [facility] OR;  Service: Vascular;  Laterality: Right;   PROSTATE BIOPSY  07/2017   cancerous polyps x2  watching psa dr Berneice Heinrich at [facility]   TOTAL KNEE ARTHROPLASTY Left 05/28/2017   Procedure: LEFT TOTAL KNEE ARTHROPLASTY;  Surgeon: Eugenia Mcalpine, MD;  Location: [facility];  Service: Orthopedics;  Laterality: Left;   TRANSTHORACIC ECHOCARDIOGRAM  06/02/2017   ef 55-60%/  possible AV bicuspid,  severely thickened and calcified leaflets with severely restricted leaflet opening, moderate regurg.(valve  area 1.17cm^2,  mean gradient , peak gradient 62mmHg)/  mild LAE/  mild TR/  mild MV thickened leaflets and regurg. (not restricted and no stenosis   VASCULAR SURGERY      Social History   Socioeconomic History   Marital status: Married    Spouse name: Not on file   Number of children: Not on file   Years of education: Not on file   Highest education level: Not on file  Occupational History   Not on file  Tobacco Use   Smoking status: Former    Years: 30    Types: Cigarettes    Quit date: 01/28/1995    Years since quitting: 27.6     Passive exposure: Never   Smokeless tobacco: Never  Vaping Use   Vaping Use: Never used  Substance and Sexual Activity   Alcohol use: No   Drug use: No   Sexual activity: Not Currently  Other Topics Concern   Not on file  Social History Narrative   Not on file   Social Determinants of Health   Financial Resource Strain: Not on file  Food Insecurity: Not on file  Transportation Needs: Not on file  Physical Activity: Not on file  Stress: Not on file  Social Connections: Not on file  Intimate Partner Violence: Not on file   History reviewed. No pertinent family history.  Current Outpatient Medications  Medication Sig Dispense Refill   acetaminophen (TYLENOL) 500 MG tablet Take 1,000 mg by mouth every 6 (six) hours as needed for moderate pain.      amLODipine (NORVASC) 5 MG tablet Take 5 mg by mouth daily.     aspirin 81 MG chewable tablet Chew 81 mg by mouth daily.     atorvastatin (LIPITOR) 80 MG tablet Take 80 mg by mouth at bedtime.  2   Brinzolamide-Brimonidine 1-0.2 % SUSP Place 1 drop into both eyes 2 (two) times daily.      carboxymethylcellulose (REFRESH TEARS) 0.5 % SOLN Place 1 drop into both eyes 3 (three) times daily as needed (dry eyes).     carvedilol (COREG) 25 MG tablet Take 12.5 mg by mouth 2 (two) times daily with a meal.     cetirizine (ZYRTEC) 10 MG tablet Take 10 mg by mouth daily.     cholecalciferol (VITAMIN D3) 25 MCG (1000 UNIT) tablet Take 1,000 Units by mouth daily.     ciprofloxacin (CIPRO) 500 MG tablet Take 500 mg by mouth 2 (two) times daily.     ezetimibe (ZETIA) 10 MG tablet Take 10 mg by mouth daily.     finasteride (PROSCAR) 5 MG tablet Take 5 mg by mouth every morning.   3   furosemide (LASIX) 40 MG tablet Take 40 mg by mouth daily as needed for edema.     insulin glargine-yfgn (SEMGLEE) 100 UNIT/ML Pen Inject 25 Units into the skin at bedtime.     latanoprost (XALATAN) 0.005 % ophthalmic solution Place 1 drop into both eyes at bedtime.       lisinopril (ZESTRIL) 10 MG tablet Take 10 mg by mouth daily.     metFORMIN (GLUCOPHAGE-XR) 500 MG 24 hr tablet Take 500 mg by mouth 2 (two) times daily.     omeprazole (PRILOSEC) 20 MG capsule Take 20 mg by mouth daily as needed (acid reflux).     Semaglutide,0.25 or 0.5MG /DOS, 2 MG/1.5ML SOPN Inject 0.25 mg into the skin every Monday.     tamsulosin (FLOMAX) 0.4 MG CAPS capsule Take  0.4 mg by mouth at bedtime.      No current facility-administered medications for this visit.    Allergies  Allergen Reactions   Atorvastatin Other (See Comments)    Muscle pain   Chlorthalidone Other (See Comments)    Pt unsure of reaction   Pravastatin Other (See Comments)    Muscle pain   Latex Hives   Lisinopril Hives   Rosuvastatin Other (See Comments)    Muscle pain   Tape Hives    Latex tape     REVIEW OF SYSTEMS:   [X]  denotes positive finding, [ ]  denotes negative finding Cardiac  Comments:  Chest pain or chest pressure:    Shortness of breath upon exertion:    Short of breath when lying flat:    Irregular heart rhythm:        Vascular    Pain in calf, thigh, or hip brought on by ambulation:    Pain in feet at night that wakes you up from your sleep:     Blood clot in your veins:    Leg swelling:         Pulmonary    Oxygen at home:    Productive cough:     Wheezing:         Neurologic    Sudden weakness in arms or legs:     Sudden numbness in arms or legs:     Sudden onset of difficulty speaking or slurred speech:    Temporary loss of vision in one eye:     Problems with dizziness:         Gastrointestinal    Blood in stool:     Vomited blood:         Genitourinary    Burning when urinating:     Blood in urine:        Psychiatric    Major depression:         Hematologic    Bleeding problems:    Problems with blood clotting too easily:        Skin    Rashes or ulcers:        Constitutional    Fever or chills:      PHYSICAL EXAMINATION:  Vitals:    09/25/22 1010  BP: 130/73  Pulse: 74  Resp: 16  Temp: (!) 97.4 F (36.3 C)  TempSrc: Temporal  SpO2: 100%  Weight: 175 lb (79.4 kg)  Height: 5\' 9"  (1.753 m)    General:  WDWN in NAD; vital signs documented above Gait: Not observed HENT: WNL, normocephalic Pulmonary: normal non-labored breathing , without Rales, rhonchi,  wheezing Cardiac: regular HR Abdomen: soft, NT, no masses Skin: without rashes Vascular Exam/Pulses: Feet are warm with motor and sensation intact however no palpable pedal pulses Extremities: without ischemic changes, without Gangrene , without cellulitis; without open wounds;  Musculoskeletal: no muscle wasting or atrophy  Neurologic: A&O X 3 Psychiatric:  The pt has Normal affect.   Non-Invasive Vascular Imaging:   Right external iliac artery stent widely patent  ABI/TBIToday's ABIToday's TBIPrevious ABIPrevious TBI  +-------+-----------+-----------+------------+------------+  Right 0.60       0.41       0.59        0.40          +-------+-----------+-----------+------------+------------+  Left  0.63       0.45       0.67        0.67          +-------+-----------+-----------+------------+------------+  ASSESSMENT/PLAN:: 77 y.o. male here for follow up for surveillance of PAD  -Mr. Biernat has known bilateral SFA occlusions.  He however is without claudication of bilateral lower extremities.  He is also without rest pain or tissue loss.  Aortoiliac duplex demonstrates a widely patent right external iliac artery stent.  ABIs are unchanged since last office visit.  He continues to take aspirin, statin daily.  No concerns from vascular surgery standpoint to proceed with knee replacement.  We will repeat aortoiliac duplex and ABIs in 1 year.  He will continue to follow regularly with his PCP for management of chronic medical conditions including hypertension and type 2 diabetes mellitus.   Emilie Rutter, PA-C [facility] [phone]  Clinic MD:   Karin Lieu

## 2022-10-05 ENCOUNTER — Other Ambulatory Visit: Payer: Self-pay

## 2022-10-05 DIAGNOSIS — I739 Peripheral vascular disease, unspecified: Secondary | ICD-10-CM

## 2022-10-05 DIAGNOSIS — I70223 Atherosclerosis of native arteries of extremities with rest pain, bilateral legs: Secondary | ICD-10-CM

## 2022-10-13 NOTE — Progress Notes (Signed)
Fax received from [facility] on 10/01/22 for medical clearance/medication hold for right total knee arthroplasty to be signed by Dr. Karin Lieu.  Provider signed on 10/09/22, form faxed back to sender on 10/09/22, verified successful, sent to scan center.

## 2022-11-17 ENCOUNTER — Ambulatory Visit: Payer: Self-pay | Admitting: Student

## 2022-11-17 DIAGNOSIS — E119 Type 2 diabetes mellitus without complications: Secondary | ICD-10-CM

## 2022-11-24 DIAGNOSIS — C61 Malignant neoplasm of prostate: Secondary | ICD-10-CM | POA: Diagnosis not present

## 2022-12-01 DIAGNOSIS — R338 Other retention of urine: Secondary | ICD-10-CM | POA: Diagnosis not present

## 2022-12-01 DIAGNOSIS — N5201 Erectile dysfunction due to arterial insufficiency: Secondary | ICD-10-CM | POA: Diagnosis not present

## 2022-12-01 DIAGNOSIS — C61 Malignant neoplasm of prostate: Secondary | ICD-10-CM | POA: Diagnosis not present

## 2022-12-08 NOTE — Progress Notes (Addendum)
Anesthesia Review:  [facility] [place]  Cardiologist : DR Senaida Ores clearance 10/28/22 on chart  Vasc- Dr Sherral Hammers clearance on chart  REquested from [facility] via fax most recent cardiolgoy note, pcp ov note, EKG, stress and echo.  Listed as urgent.   Chest x-ray : 01/19/22- 2 view  EKG : 12/11/22  Echo : 2019  Stress test: Cardiac Cath :  Activity level: can do a flight of stairs without difficutly  Sleep Study/ CPAP : none  Fasting Blood Sugar :      / Checks Blood Sugar -- times a day:   Blood Thinner/ Instructions /Last Dose: ASA / Instructions/ Last Dose :   81 mg aspiirn    DM- type 2 - checks glucose twice daily  Hgba1c-  12/11/22 - 7.1 routed to DR Swinteck on 12/14/2022   Metformin-  none day of surgery  Semaglutide- last dose on 11/30/22    Labs done 10/07/22 on chart from [place] in [place]   PT is a Jehovah witness  Faxed consent to Blood Bank  Faxed consent to MD Noted in Allergies  ORder in epic  Noted in FYI  Advaned directive on chart

## 2022-12-08 NOTE — Patient Instructions (Signed)
SURGICAL WAITING ROOM VISITATION  Patients having surgery or a procedure may have no more than 2 support people in the waiting area - these visitors may rotate.    Children under the age of 37 must have an adult with them who is not the patient.  Due to an increase in RSV and influenza rates and associated hospitalizations, children ages 1 and under may not visit patients in [facility] hospitals.  If the patient needs to stay at the hospital during part of their recovery, the visitor guidelines for inpatient rooms apply. Pre-op nurse will coordinate an appropriate time for 1 support person to accompany patient in pre-op.  This support person may not rotate.    Please refer to the [facility] website for the visitor guidelines for Inpatients (after your surgery is over and you are in a regular room).       Your procedure is scheduled on:  12/16/2022    Report to [facility] Main Entrance    Report to admitting at  1200 noon    Call this number if you have problems the morning of surgery [phone]   Do not eat food :After Midnight.   After Midnight you may have the following liquids until __ 1130____ AM DAY OF SURGERY  Water Non-Citrus Juices (without pulp, NO RED-Apple, White grape, White cranberry) Black Coffee (NO MILK/CREAM OR CREAMERS, sugar ok)  Clear Tea (NO MILK/CREAM OR CREAMERS, sugar ok) regular and decaf                             Plain Jell-O (NO RED)                                           Fruit ices (not with fruit pulp, NO RED)                                     Popsicles (NO RED)                                                               Sports drinks like Gatorade (NO RED)                    The day of surgery:  Drink ONE (1) Pre-Surgery Clear Ensure or G2 at   1130AM ( have completed by )  the morning of surgery. Drink in one sitting. Do not sip.  This drink was given to you during your hospital  pre-op appointment visit. Nothing else to  drink after completing the  Pre-Surgery Clear Ensure or G2.          If you have questions, please contact your surgeon's office.       Oral Hygiene is also important to reduce your risk of infection.                                    Remember - BRUSH YOUR TEETH THE MORNING OF SURGERY  WITH YOUR REGULAR TOOTHPASTE  DENTURES WILL BE REMOVED PRIOR TO SURGERY PLEASE DO NOT APPLY "Poly grip" OR ADHESIVES!!!   Do NOT smoke after Midnight   Take these medicines the morning of surgery with A SIP OF WATER:  amlodipine, eye drops as usual , coreg, zyrtec, omeprazole if needed, proscar   DO NOT TAKE ANY ORAL DIABETIC MEDICATIONS DAY OF YOUR SURGERY  Bring CPAP mask and tubing day of surgery.                              You may not have any metal on your body including hair pins, jewelry, and body piercing             Do not wear make-up, lotions, powders, perfumes/cologne, or deodorant  Do not wear nail polish including gel and S&S, artificial/acrylic nails, or any other type of covering on natural nails including finger and toenails. If you have artificial nails, gel coating, etc. that needs to be removed by a nail salon please have this removed prior to surgery or surgery may need to be canceled/ delayed if the surgeon/ anesthesia feels like they are unable to be safely monitored.   Do not shave  48 hours prior to surgery.               Men may shave face and neck.   Do not bring valuables to the hospital. [facility] IS NOT             RESPONSIBLE   FOR VALUABLES.   Contacts, glasses, dentures or bridgework may not be worn into surgery.   Bring small overnight bag day of surgery.   DO NOT BRING YOUR HOME MEDICATIONS TO THE HOSPITAL. PHARMACY WILL DISPENSE MEDICATIONS LISTED ON YOUR MEDICATION LIST TO YOU DURING YOUR ADMISSION IN THE HOSPITAL!    Patients discharged on the day of surgery will not be allowed to drive home.  Someone NEEDS to stay with you for the first 24 hours after  anesthesia.   Special Instructions: Bring a copy of your healthcare power of attorney and living will documents the day of surgery if you haven't scanned them before.              Please read over the following fact sheets you were given: IF YOU HAVE QUESTIONS ABOUT YOUR PRE-OP INSTRUCTIONS PLEASE CALL [phone]   If you received a COVID test during your pre-op visit  it is requested that you wear a mask when out in public, stay away from anyone that may not be feeling well and notify your surgeon if you develop symptoms. If you test positive for Covid or have been in contact with anyone that has tested positive in the last 10 days please notify you surgeon.      Pre-operative 5 CHG Bath Instructions   You can play a key role in reducing the risk of infection after surgery. Your skin needs to be as free of germs as possible. You can reduce the number of germs on your skin by washing with CHG (chlorhexidine gluconate) soap before surgery. CHG is an antiseptic soap that kills germs and continues to kill germs even after washing.   DO NOT use if you have an allergy to chlorhexidine/CHG or antibacterial soaps. If your skin becomes reddened or irritated, stop using the CHG and notify one of our RNs at [phone].   Please shower with the CHG soap starting  4 days before surgery using the following schedule:     Please keep in mind the following:  DO NOT shave, including legs and underarms, starting the day of your first shower.   You may shave your face at any point before/day of surgery.  Place clean sheets on your bed the day you start using CHG soap. Use a clean washcloth (not used since being washed) for each shower. DO NOT sleep with pets once you start using the CHG.   CHG Shower Instructions:  If you choose to wash your hair and private area, wash first with your normal shampoo/soap.  After you use shampoo/soap, rinse your hair and body thoroughly to remove shampoo/soap residue.   Turn the water OFF and apply about 3 tablespoons (45 ml) of CHG soap to a CLEAN washcloth.  Apply CHG soap ONLY FROM YOUR NECK DOWN TO YOUR TOES (washing for 3-5 minutes)  DO NOT use CHG soap on face, private areas, open wounds, or sores.  Pay special attention to the area where your surgery is being performed.  If you are having back surgery, having someone wash your back for you may be helpful. Wait 2 minutes after CHG soap is applied, then you may rinse off the CHG soap.  Pat dry with a clean towel  Put on clean clothes/pajamas   If you choose to wear lotion, please use ONLY the CHG-compatible lotions on the back of this paper.     Additional instructions for the day of surgery: DO NOT APPLY any lotions, deodorants, cologne, or perfumes.   Put on clean/comfortable clothes.  Brush your teeth.  Ask your nurse before applying any prescription medications to the skin.      CHG Compatible Lotions   Aveeno Moisturizing lotion  Cetaphil Moisturizing Cream  Cetaphil Moisturizing Lotion  Clairol Herbal Essence Moisturizing Lotion, Dry Skin  Clairol Herbal Essence Moisturizing Lotion, Extra Dry Skin  Clairol Herbal Essence Moisturizing Lotion, Normal Skin  Curel Age Defying Therapeutic Moisturizing Lotion with Alpha Hydroxy  Curel Extreme Care Body Lotion  Curel Soothing Hands Moisturizing Hand Lotion  Curel Therapeutic Moisturizing Cream, Fragrance-Free  Curel Therapeutic Moisturizing Lotion, Fragrance-Free  Curel Therapeutic Moisturizing Lotion, Original Formula  Eucerin Daily Replenishing Lotion  Eucerin Dry Skin Therapy Plus Alpha Hydroxy Crme  Eucerin Dry Skin Therapy Plus Alpha Hydroxy Lotion  Eucerin Original Crme  Eucerin Original Lotion  Eucerin Plus Crme Eucerin Plus Lotion  Eucerin TriLipid Replenishing Lotion  Keri Anti-Bacterial Hand Lotion  Keri Deep Conditioning Original Lotion Dry Skin Formula Softly Scented  Keri Deep Conditioning Original Lotion, Fragrance  Free Sensitive Skin Formula  Keri Lotion Fast Absorbing Fragrance Free Sensitive Skin Formula  Keri Lotion Fast Absorbing Softly Scented Dry Skin Formula  Keri Original Lotion  Keri Skin Renewal Lotion Keri Silky Smooth Lotion  Keri Silky Smooth Sensitive Skin Lotion  Nivea Body Creamy Conditioning Oil  Nivea Body Extra Enriched Teacher, adult education Moisturizing Lotion Nivea Crme  Nivea Skin Firming Lotion  NutraDerm 30 Skin Lotion  NutraDerm Skin Lotion  NutraDerm Therapeutic Skin Cream  NutraDerm Therapeutic Skin Lotion  ProShield Protective Hand Cream  Provon moisturizing lotion

## 2022-12-11 ENCOUNTER — Encounter (HOSPITAL_COMMUNITY)
Admission: RE | Admit: 2022-12-11 | Discharge: 2022-12-11 | Disposition: A | Discharge status: Home / Self Care | Payer: No Typology Code available for payment source | Source: Ambulatory Visit | Attending: Orthopedic Surgery | Admitting: Orthopedic Surgery

## 2022-12-11 ENCOUNTER — Other Ambulatory Visit: Payer: Self-pay

## 2022-12-11 ENCOUNTER — Encounter (HOSPITAL_COMMUNITY): Payer: Self-pay

## 2022-12-11 VITALS — BP 139/67 | HR 79 | Temp 97.6°F | Resp 16 | Ht 69.0 in | Wt 162.0 lb

## 2022-12-11 DIAGNOSIS — M1711 Unilateral primary osteoarthritis, right knee: Secondary | ICD-10-CM | POA: Diagnosis not present

## 2022-12-11 DIAGNOSIS — I10 Essential (primary) hypertension: Secondary | ICD-10-CM | POA: Insufficient documentation

## 2022-12-11 DIAGNOSIS — Z87891 Personal history of nicotine dependence: Secondary | ICD-10-CM | POA: Insufficient documentation

## 2022-12-11 DIAGNOSIS — I2510 Atherosclerotic heart disease of native coronary artery without angina pectoris: Secondary | ICD-10-CM | POA: Insufficient documentation

## 2022-12-11 DIAGNOSIS — Z01818 Encounter for other preprocedural examination: Secondary | ICD-10-CM

## 2022-12-11 DIAGNOSIS — E1151 Type 2 diabetes mellitus with diabetic peripheral angiopathy without gangrene: Secondary | ICD-10-CM | POA: Diagnosis not present

## 2022-12-11 DIAGNOSIS — Z794 Long term (current) use of insulin: Secondary | ICD-10-CM | POA: Diagnosis not present

## 2022-12-11 DIAGNOSIS — E119 Type 2 diabetes mellitus without complications: Secondary | ICD-10-CM

## 2022-12-11 LAB — SURGICAL PCR SCREEN
MRSA, PCR: NEGATIVE
Staphylococcus aureus: NEGATIVE

## 2022-12-11 LAB — BASIC METABOLIC PANEL
Anion gap: 10 (ref 5–15)
BUN: 23 mg/dL (ref 8–23)
CO2: 22 mmol/L (ref 22–32)
Calcium: 9.2 mg/dL (ref 8.9–10.3)
Chloride: 104 mmol/L (ref 98–111)
Creatinine, Ser: 1.3 mg/dL — ABNORMAL HIGH (ref 0.61–1.24)
GFR, Estimated: 57 mL/min — ABNORMAL LOW (ref >60)
Glucose, Bld: 182 mg/dL — ABNORMAL HIGH (ref 70–99)
Potassium: 4.2 mmol/L (ref 3.5–5.1)
Sodium: 136 mmol/L (ref 135–145)

## 2022-12-11 LAB — GLUCOSE, CAPILLARY: Glucose-Capillary: 168 mg/dL — ABNORMAL HIGH (ref 70–99)

## 2022-12-11 LAB — CBC
HCT: 42.7 % (ref 39.0–52.0)
Hemoglobin: 13.7 g/dL (ref 13.0–17.0)
MCH: 30 pg (ref 26.0–34.0)
MCHC: 32.1 g/dL (ref 30.0–36.0)
MCV: 93.6 fL (ref 80.0–100.0)
Platelets: 198 10*3/uL (ref 150–400)
RBC: 4.56 MIL/uL (ref 4.22–5.81)
RDW: 14.4 % (ref 11.5–15.5)
WBC: 4.9 10*3/uL (ref 4.0–10.5)
nRBC: 0 % (ref 0.0–0.2)

## 2022-12-11 LAB — NO BLOOD PRODUCTS

## 2022-12-11 NOTE — Progress Notes (Signed)
Anesthesia Chart Review   Case: [id number] Date/Time: 12/16/22 1425   Procedure: COMPUTER ASSISTED TOTAL KNEE ARTHROPLASTY (Right: Knee) - 160   Anesthesia type: Spinal   Pre-op diagnosis: Right knee osteoarthritis   Location: [facility] ROOM 07 / [facility]   Surgeons: Samson Frederic, MD       DISCUSSION:76 y.o. former smoker with h/o HTN, DM II, AV stenosis s/p AV replacement 2020, CAD, PVD w/history of right external iliac artery stenting, iliofemoral endarterectomy, and bovine patch angioplasty by Dr. Karin Lieu on 10/02/2021, BPH, right knee OA scheduled for above procedure 12/16/2022 with Dr. Samson Frederic.   Pt last seen by vascular surgery 09/25/2022. Per OV note, "Mr. Retana has known bilateral SFA occlusions. He however is without claudication of bilateral lower extremities. He is also without rest pain or tissue loss. Aortoiliac duplex demonstrates a widely patent right external iliac artery stent. ABIs are unchanged since last office visit. He continues to take aspirin, statin daily. No concerns from vascular surgery standpoint to proceed with knee replacement. We will repeat aortoiliac duplex and ABIs in 1 year. He will continue to follow regularly with his PCP for management of chronic medical conditions including hypertension and type 2 diabetes mellitus."  Clearance received from cardiology which states, "pt is moderate risk given CAD history but good functional capacity; no need for further testing." Pt follows with cardiology at the [facility].  VS: BP 139/67   Pulse 79   Temp 36.4 C (Oral)   Resp 16   Ht 5\' 9"  (1.753 m)   Wt 73.5 kg   SpO2 100%   BMI 23.92 kg/m   PROVIDERS: Deatra James, MD is PCP   LABS: Labs reviewed: Acceptable for surgery. (all labs ordered are listed, but only abnormal results are displayed)  Labs Reviewed  BASIC METABOLIC PANEL - Abnormal; Notable for the following components:      Result Value   Glucose, Bld 182 (*)    Creatinine, Ser 1.30 (*)    GFR, Estimated  57 (*)    All other components within normal limits  GLUCOSE, CAPILLARY - Abnormal; Notable for the following components:   Glucose-Capillary 168 (*)    All other components within normal limits  SURGICAL PCR SCREEN  CBC  HEMOGLOBIN A1C     IMAGES:   EKG:   CV: 04/2019 TTE: Mild left ventricular hypertrophy Normal left ventricular size and function, EF 60-65% Grade II diastolic dysfunction Right ventricle appears borderline dilated. TAPSE=1.1cm suggests reduced systolic function, but visually systolic function appears preserved 23mm Carpentier-Edwards Perimount Magna Ease pericardial bioprosthetic valve is well-seated in the aortic position with no significant regurgitation or stenosis; peak velocity 1.5 m/s. Mild mitral regurgitation Normal caliber IVC suggests normal right atrial pressure. Estimated PASP , upper normal No pericardial effusion In comparison with the images of 07/08/2018 the aortic valve has been replaced.  Echo 06/02/2017 - Left ventricle: The cavity size was normal. There was moderate    concentric hypertrophy. Systolic function was normal. The    estimated ejection fraction was in the range of 55% to 60%. Wall    motion was normal; there were no regional wall motion    abnormalities.  - Aortic valve: Possibly bicuspid; severely thickened, severely    calcified leaflets. Valve mobility was restricted. There was    moderate stenosis. There was moderate regurgitation. Mean    gradient (S): 30 mm Hg. Peak gradient (S): 53 mm Hg. Valve area    (VTI): 1.17 cm^2. Valve area (Vmax): 1.09  cm^2. Valve area    (Vmean): 0.99 cm^2.  - Mitral valve: Calcified annulus. Mildly thickened leaflets .    There was mild regurgitation.  - Left atrium: The atrium was mildly dilated.  - Right ventricle: The cavity size was moderately dilated. Wall    thickness was normal. Systolic function was mildly reduced.  - Tricuspid valve: There was mild regurgitation.  -  Pulmonary arteries: Systolic pressure was within the normal    range.  - Inferior vena cava: The vessel was normal in size.  - Pericardium, extracardiac: There was no pericardial effusion.  Past Medical History:  Diagnosis Date   Aortic valve stenosis, moderate    per echo 06-02-2017 (in epic)  ef 55-60%,  possible bicuspid AV,  severeely thickened and calcified with severely restricted leaflet opening,  valve area 1.17cm^2, mean gradient , peak gradient ,  moderate AR   Arthritis    oa   BPH associated with nocturia    Cancer (HCC)    prostate cancer   Complication of anesthesia    due to heart murmur  mod to severe aortic stenosis   Coronary artery disease    Full dentures    Glaucoma, both eyes    Heart murmur, systolic    History of kidney stones    Hypertension    Left ureteral stone    Myocardial infarction (HCC)    mild in 11-2017 at [facility] F/U from [place] on chart   Peripheral vascular disease (HCC)    SIRS (systemic inflammatory response syndrome) (HCC) 05/31/2017   hospital admission--  unknown etiology   Type 2 diabetes mellitus (HCC)    Wears glasses     Past Surgical History:  Procedure Laterality Date   AORTIC VALVE REPLACEMENT     COLONOSCOPY     CORONARY ARTERY BYPASS GRAFT     pt reports Aortic valve replacement and CABG in 2020   CYSTOSCOPY WITH RETROGRADE PYELOGRAM, URETEROSCOPY AND STENT PLACEMENT Left 02/04/2018   Procedure: CYSTOSCOPY WITH RETROGRADE PYELOGRAM, URETEROSCOPY AND STENT EXCHANGE,BASKETING OF STONE;  Surgeon: Sebastian Ache, MD;  [facility];  Service: Urology;  Laterality: Left;   ENDARTERECTOMY FEMORAL Right 10/02/2021   Procedure: RIGHT COMMON FEMORAL ENDARTERECTOMY WITH 1CM X 14CM [name];  Surgeon: Victorino Sparrow, MD;  Location: [facility] OR;  Service: Vascular;  Laterality: Right;   EXAM UNDER ANESTHESIA WITH MANIPULATION OF KNEE Left 09/30/2017   Procedure: EXAM UNDER ANESTHESIA WITH MANIPULATION OF KNEE;   Surgeon: Eugenia Mcalpine, MD;  Location: [facility];  Service: Orthopedics;  Laterality: Left;  30 mins   INSERTION OF ILIAC STENT Right 10/02/2021   Procedure: INSERTION OF RIGHT ILIAC STENT;  Surgeon: Victorino Sparrow, MD;  Location: [facility] OR;  Service: Vascular;  Laterality: Right;   PATCH ANGIOPLASTY Right 10/02/2021   Procedure: PATCH ANGIOPLASTY;  Surgeon: Victorino Sparrow, MD;  Location: [facility] OR;  Service: Vascular;  Laterality: Right;   PROSTATE BIOPSY  07/2017   cancerous polyps x2  watching psa dr Berneice Heinrich at [facility]   TOTAL KNEE ARTHROPLASTY Left 05/28/2017   Procedure: LEFT TOTAL KNEE ARTHROPLASTY;  Surgeon: Eugenia Mcalpine, MD;  [facility];  Service: Orthopedics;  Laterality: Left;   TRANSTHORACIC ECHOCARDIOGRAM  06/02/2017   ef 55-60%/  possible AV bicuspid,  severely thickened and calcified leaflets with severely restricted leaflet opening, moderate regurg.(valve area 1.17cm^2,  mean gradient , peak gradient 31mmHg)/  mild LAE/  mild TR/  mild MV thickened leaflets and regurg. (not restricted  and no stenosis   VASCULAR SURGERY      MEDICATIONS:  acetaminophen (TYLENOL) 500 MG tablet   aspirin 81 MG chewable tablet   atorvastatin (LIPITOR) 80 MG tablet   Brinzolamide-Brimonidine 1-0.2 % SUSP   carvedilol (COREG) 25 MG tablet   cetirizine (ZYRTEC) 10 MG tablet   chlorthalidone (HYGROTON) 25 MG tablet   cholecalciferol (VITAMIN D3) 25 MCG (1000 UNIT) tablet   ezetimibe (ZETIA) 10 MG tablet   finasteride (PROSCAR) 5 MG tablet   guaiFENesin (MUCINEX) 600 MG 12 hr tablet   latanoprost (XALATAN) 0.005 % ophthalmic solution   Lidocaine HCl (PAIN RELIEF ROLL-ON EX)   lisinopril (ZESTRIL) 5 MG tablet   metFORMIN (GLUCOPHAGE-XR) 500 MG 24 hr tablet   omeprazole (PRILOSEC) 20 MG capsule   Semaglutide, 1 MG/DOSE, (OZEMPIC, 1 MG/DOSE,) 2 MG/1.5ML SOPN   tamsulosin (FLOMAX) 0.4 MG CAPS capsule   No current facility-administered medications for this encounter.     Jodell Cipro Ward, PA-C [facility]-Surgical Testing [phone]

## 2022-12-11 NOTE — Anesthesia Preprocedure Evaluation (Addendum)
Anesthesia Evaluation  Patient identified by MRN, date of birth, ID band Patient awake    Reviewed: Allergy & Precautions, NPO status , Patient's Chart, lab work & pertinent test results  History of Anesthesia Complications Negative for: history of anesthetic complications  Airway Mallampati: I  TM Distance: >3 FB Neck ROM: Full    Dental  (+) Edentulous Upper, Edentulous Lower   Pulmonary former smoker Chronic hoarseness   breath sounds clear to auscultation       Cardiovascular hypertension, Pt. on medications (-) angina + CAD, + Past MI, + CABG and + Peripheral Vascular Disease  + Valvular Problems/Murmurs (s/p AVR)  Rhythm:Regular Rate:Normal  Echocardiogram: 03/07/2019 Post-Intervention Summary The left ventricle is normal in size. Left ventricular systolic function is  normal. Ejection Fraction = >55%. The right ventricular systolic function is mildly reduced.   Left atrial appendage has been excluded. No change in mitral valve. There is trace tricuspid regurgitation. Magnaease valve in place with good leaflet motion. Well positioned  supra-annular position of leaflets. No paravalvular leak. Peak gradient  .     Neuro/Psych Glaucoma    GI/Hepatic negative GI ROS, Neg liver ROS,,,  Endo/Other  diabetes (glu 131), Oral Hypoglycemic Agents  ozempic  Renal/GU Renal InsufficiencyRenal disease     Musculoskeletal   Abdominal   Peds  Hematology negative hematology ROS (+)   Anesthesia Other Findings   Reproductive/Obstetrics                              Anesthesia Physical Anesthesia Plan  ASA: 3  Anesthesia Plan: Spinal   Post-op Pain Management: Regional block* and Tylenol PO (pre-op)*   Induction: Intravenous  PONV Risk Score and Plan: 1  Airway Management Planned: Natural Airway and Simple Face Mask  Additional Equipment: None  Intra-op Plan:    Post-operative Plan:   Informed Consent: I have reviewed the patients History and Physical, chart, labs and discussed the procedure including the risks, benefits and alternatives for the proposed anesthesia with the patient or authorized representative who has indicated his/her understanding and acceptance.       Plan Discussed with: CRNA and Surgeon  Anesthesia Plan Comments: (See PAT note 12/11/2022)        Anesthesia Quick Evaluation

## 2022-12-12 LAB — HEMOGLOBIN A1C
Hgb A1c MFr Bld: 7.1 % — ABNORMAL HIGH (ref 4.8–5.6)
Mean Plasma Glucose: 157 mg/dL

## 2022-12-15 ENCOUNTER — Ambulatory Visit: Payer: Self-pay | Admitting: Student

## 2022-12-15 NOTE — H&P (Signed)
TOTAL KNEE ADMISSION H&P  Patient is being admitted for right total knee arthroplasty.  Subjective:  Chief Complaint:right knee pain.  HPI: Jeremy Nicholson, 77 y.o. male, has a history of pain and functional disability in the right knee due to arthritis and has failed non-surgical conservative treatments for greater than 12 weeks to includeNSAID's and/or analgesics, corticosteriod injections, flexibility and strengthening excercises, use of assistive devices, and activity modification.  Onset of symptoms was gradual, starting 10 years ago with rapidlly worsening course since that time. The patient noted no past surgery on the right knee(s).  Patient currently rates pain in the right knee(s) at 10 out of 10 with activity. Patient has night pain, worsening of pain with activity and weight bearing, pain that interferes with activities of daily living, pain with passive range of motion, crepitus, and joint swelling.  Patient has evidence of subchondral cysts, subchondral sclerosis, periarticular osteophytes, and joint space narrowing by imaging studies. There is no active infection.  Patient Active Problem List   Diagnosis Date Noted   PAD (peripheral artery disease) (HCC) 10/02/2021   Allergic rhinitis 11/15/2020   Benign prostatic hyperplasia without lower urinary tract symptoms 11/15/2020   Chronic ischemic heart disease 11/15/2020   Chronic kidney disease 11/15/2020   Diabetic renal disease (HCC) 11/15/2020   Elevated blood-pressure reading without diagnosis of hypertension 11/15/2020   Other specified counseling 11/15/2020   Gastroesophageal reflux disease without esophagitis 11/15/2020   Unspecified open-angle glaucoma, moderate stage 11/15/2020   Reason for consultation 11/15/2020   CAD in native artery 02/27/2019   Arthrofibrosis of total knee arthroplasty, sequela 09/30/2017   SIRS (systemic inflammatory response syndrome) (HCC) 05/31/2017   DM2 (diabetes mellitus, type 2) (HCC)  05/31/2017   HTN (hypertension) 05/31/2017   S/P knee replacement 05/28/2017   Past Medical History:  Diagnosis Date   Aortic valve stenosis, moderate    per echo 06-02-2017 (in epic)  ef 55-60%,  possible bicuspid AV,  severeely thickened and calcified with severely restricted leaflet opening,  valve area 1.17cm^2, mean gradient , peak gradient ,  moderate AR   Arthritis    oa   BPH associated with nocturia    Cancer (HCC)    prostate cancer   Complication of anesthesia    due to heart murmur  mod to severe aortic stenosis   Coronary artery disease    Full dentures    Glaucoma, both eyes    Heart murmur, systolic    History of kidney stones    Hypertension    Left ureteral stone    Myocardial infarction (HCC)    mild in 11-2017 at [facility] F/U from [place] on chart   Peripheral vascular disease (HCC)    SIRS (systemic inflammatory response syndrome) (HCC) 05/31/2017   hospital admission--  unknown etiology   Type 2 diabetes mellitus (HCC)    Wears glasses     Past Surgical History:  Procedure Laterality Date   AORTIC VALVE REPLACEMENT     COLONOSCOPY     CORONARY ARTERY BYPASS GRAFT     pt reports Aortic valve replacement and CABG in 2020   CYSTOSCOPY WITH RETROGRADE PYELOGRAM, URETEROSCOPY AND STENT PLACEMENT Left 02/04/2018   Procedure: CYSTOSCOPY WITH RETROGRADE PYELOGRAM, URETEROSCOPY AND STENT EXCHANGE,BASKETING OF STONE;  Surgeon: Sebastian Ache, MD;  Location: [facility];  Service: Urology;  Laterality: Left;   ENDARTERECTOMY FEMORAL Right 10/02/2021   Procedure: RIGHT COMMON FEMORAL ENDARTERECTOMY WITH 1CM X 14CM [name];  Surgeon: Victorino Sparrow, MD;  Location: [facility] OR;  Service: Vascular;  Laterality: Right;   EXAM UNDER ANESTHESIA WITH MANIPULATION OF KNEE Left 09/30/2017   Procedure: EXAM UNDER ANESTHESIA WITH MANIPULATION OF KNEE;  Surgeon: Eugenia Mcalpine, MD;  Location: [facility];  Service: Orthopedics;  Laterality: Left;  30 mins   INSERTION OF  ILIAC STENT Right 10/02/2021   Procedure: INSERTION OF RIGHT ILIAC STENT;  Surgeon: Victorino Sparrow, MD;  Location: [facility] OR;  Service: Vascular;  Laterality: Right;   PATCH ANGIOPLASTY Right 10/02/2021   Procedure: PATCH ANGIOPLASTY;  Surgeon: Victorino Sparrow, MD;  Location: [facility] OR;  Service: Vascular;  Laterality: Right;   PROSTATE BIOPSY  07/2017   cancerous polyps x2  watching psa dr Berneice Heinrich at [facility]   TOTAL KNEE ARTHROPLASTY Left 05/28/2017   Procedure: LEFT TOTAL KNEE ARTHROPLASTY;  Surgeon: Eugenia Mcalpine, MD;  Location: [facility];  Service: Orthopedics;  Laterality: Left;   TRANSTHORACIC ECHOCARDIOGRAM  06/02/2017   ef 55-60%/  possible AV bicuspid,  severely thickened and calcified leaflets with severely restricted leaflet opening, moderate regurg.(valve area 1.17cm^2,  mean gradient , peak gradient 41mmHg)/  mild LAE/  mild TR/  mild MV thickened leaflets and regurg. (not restricted and no stenosis   VASCULAR SURGERY      Current Outpatient Medications  Medication Sig Dispense Refill Last Dose   acetaminophen (TYLENOL) 500 MG tablet Take 1,000 mg by mouth every 6 (six) hours as needed for moderate pain.       aspirin 81 MG chewable tablet Chew 81 mg by mouth daily.      atorvastatin (LIPITOR) 80 MG tablet Take 80 mg by mouth at bedtime.  2    Brinzolamide-Brimonidine 1-0.2 % SUSP Place 1 drop into both eyes 2 (two) times daily.       carvedilol (COREG) 25 MG tablet Take 12.5 mg by mouth 2 (two) times daily with a meal.      cetirizine (ZYRTEC) 10 MG tablet Take 10 mg by mouth daily.      chlorthalidone (HYGROTON) 25 MG tablet Take 12.5 mg by mouth daily.      cholecalciferol (VITAMIN D3) 25 MCG (1000 UNIT) tablet Take 1,000 Units by mouth daily.      ezetimibe (ZETIA) 10 MG tablet Take 10 mg by mouth daily.      finasteride (PROSCAR) 5 MG tablet Take 5 mg by mouth every morning.   3    guaiFENesin (MUCINEX) 600 MG 12 hr tablet Take 600 mg by mouth 2 (two) times daily.       latanoprost (XALATAN) 0.005 % ophthalmic solution Place 1 drop into both eyes at bedtime.       Lidocaine HCl (PAIN RELIEF ROLL-ON EX) Apply 1 Application topically daily as needed (pain).      lisinopril (ZESTRIL) 5 MG tablet Take 5 mg by mouth daily.      metFORMIN (GLUCOPHAGE-XR) 500 MG 24 hr tablet Take 1,000 mg by mouth 2 (two) times daily.      omeprazole (PRILOSEC) 20 MG capsule Take 20 mg by mouth daily as needed (acid reflux).      Semaglutide, 1 MG/DOSE, (OZEMPIC, 1 MG/DOSE,) 2 MG/1.5ML SOPN Inject 1 mg into the skin every Monday.      tamsulosin (FLOMAX) 0.4 MG CAPS capsule Take 0.4 mg by mouth at bedtime.       No current facility-administered medications for this visit.   Allergies  Allergen Reactions   Atorvastatin Other (See Comments)    Muscle pain  Other     BLOOD PRO{DUCT REFUSAL   Pravastatin Other (See Comments)    Muscle pain   Latex Hives   Rosuvastatin Other (See Comments)    Muscle pain   Tape Hives    Latex tape    Social History   Tobacco Use   Smoking status: Former    Current packs/day: 0.00    Types: Cigarettes    Start date: 01/27/1965    Quit date: 01/28/1995    Years since quitting: 27.8    Passive exposure: Never   Smokeless tobacco: Never  Substance Use Topics   Alcohol use: No    No family history on file.   Review of Systems  Musculoskeletal:  Positive for arthralgias, gait problem and joint swelling.  All other systems reviewed and are negative.   Objective:  Physical Exam Constitutional:      Appearance: Normal appearance.  HENT:     Head: Normocephalic and atraumatic.     Nose: Nose normal.     Mouth/Throat:     Mouth: Mucous membranes are moist.     Pharynx: Oropharynx is clear.  Eyes:     Conjunctiva/sclera: Conjunctivae normal.  Cardiovascular:     Rate and Rhythm: Normal rate and regular rhythm.     Pulses:          Dorsalis pedis pulses are detected w/ Doppler on the right side and detected w/ Doppler on the left  side.     Heart sounds: Normal heart sounds.  Pulmonary:     Effort: Pulmonary effort is normal.     Breath sounds: Normal breath sounds.  Abdominal:     General: Abdomen is flat.     Palpations: Abdomen is soft.  Genitourinary:    Comments: deferred Musculoskeletal:     Cervical back: Normal range of motion and neck supple.     Comments: Examination of the right knee reveals no skin wounds or lesions. No swelling, trace effusion. No warmth or erythema. Tenderness to palpation medial joint line, lateral joint line, and peripatellar retinacular tissues with a positive grind sign. Range of motion 5 to 110 degrees without any ligamentous instability. Crepitation with range of motion. Painless range of motion of the hip.   Distally, patient's foot is warm and well-perfused. He does not have palpable pedal pulses. No focal motor or sensory deficit.  Ambulates with an antalgic gait.  Skin:    General: Skin is warm and dry.     Capillary Refill: Capillary refill takes less than 2 seconds.  Neurological:     General: No focal deficit present.     Mental Status: He is alert and oriented to person, place, and time.  Psychiatric:        Mood and Affect: Mood normal.        Behavior: Behavior normal.        Thought Content: Thought content normal.        Judgment: Judgment normal.     Vital signs in last 24 hours: @VSRANGES @  Labs:   Estimated body mass index is 23.92 kg/m as calculated from the following:   Height as of 12/11/22: 5\' 9"  (1.753 m).   Weight as of 12/11/22: 73.5 kg.   Imaging Review Plain radiographs demonstrate severe degenerative joint disease of the right knee(s). The overall alignment issignificant varus. The bone quality appears to be adequate for age and reported activity level.      Assessment/Plan:  End stage arthritis, right knee  The patient history, physical examination, clinical judgment of the provider and imaging studies are consistent with end  stage degenerative joint disease of the right knee(s) and total knee arthroplasty is deemed medically necessary. The treatment options including medical management, injection therapy arthroscopy and arthroplasty were discussed at length. The risks and benefits of total knee arthroplasty were presented and reviewed. The risks due to aseptic loosening, infection, stiffness, patella tracking problems, thromboembolic complications and other imponderables were discussed. The patient acknowledged the explanation, agreed to proceed with the plan and consent was signed. Patient is being admitted for inpatient treatment for surgery, pain control, PT, OT, prophylactic antibiotics, VTE prophylaxis, progressive ambulation and ADL's and discharge planning. The patient is planning to be discharged home with OPPT after an overnight stay.   Therapy Plans: outpatient therapy. 1st PT appt with [facility] 12/21/22. Disposition: Home with wife Planned DVT Prophylaxis: aspirin 81mg  BID DME needed: walker. Has ice machine.  PCP: Cleared.  Cardiology: Cleared. Okay to hold aspirin 5 days if needed for surgery, otherwise would continue.  Vascular: Cleared. Continue aspirin.  TXA: IV Allergies:  - Latex - itching Anesthesia Concerns: None.  BMI: 24.4 Last HgbA1c: 7.1 Other: - CAD - Aspirin 81mg  baseline.  - T2DM, metformin.  - Semaglutide, has held, can resume after  - History left TKA. - PAD, dopplerable pulses.  - Jehovah's witness, does not accept blood.  - Oxycodone, zofran.  - NO NSAIDs.  - [facility], [place] for 1st Rx.  - 12/11/22: K+ 4.2, Cr. 1.30, Hgb 13.7, .     Patient's anticipated LOS is less than 2 midnights, meeting these requirements: - Younger than 2 - Lives within 1 hour of care - Has a competent adult at home to recover with post-op recover - NO history of  - Chronic pain requiring opiods  - Diabetes  - Coronary Artery Disease  - Heart failure  - Heart attack  - Stroke  -  DVT/VTE  - Cardiac arrhythmia  - Respiratory Failure/COPD  - Renal failure  - Anemia  - Advanced Liver disease

## 2022-12-15 NOTE — H&P (View-Only) (Signed)
TOTAL KNEE ADMISSION H&P  Patient is being admitted for right total knee arthroplasty.  Subjective:  Chief Complaint:right knee pain.  HPI: Jeremy Nicholson, 77 y.o. male, has a history of pain and functional disability in the right knee due to arthritis and has failed non-surgical conservative treatments for greater than 12 weeks to includeNSAID's and/or analgesics, corticosteriod injections, flexibility and strengthening excercises, use of assistive devices, and activity modification.  Onset of symptoms was gradual, starting 10 years ago with rapidlly worsening course since that time. The patient noted no past surgery on the right knee(s).  Patient currently rates pain in the right knee(s) at 10 out of 10 with activity. Patient has night pain, worsening of pain with activity and weight bearing, pain that interferes with activities of daily living, pain with passive range of motion, crepitus, and joint swelling.  Patient has evidence of subchondral cysts, subchondral sclerosis, periarticular osteophytes, and joint space narrowing by imaging studies. There is no active infection.  Patient Active Problem List   Diagnosis Date Noted   PAD (peripheral artery disease) (HCC) 10/02/2021   Allergic rhinitis 11/15/2020   Benign prostatic hyperplasia without lower urinary tract symptoms 11/15/2020   Chronic ischemic heart disease 11/15/2020   Chronic kidney disease 11/15/2020   Diabetic renal disease (HCC) 11/15/2020   Elevated blood-pressure reading without diagnosis of hypertension 11/15/2020   Other specified counseling 11/15/2020   Gastroesophageal reflux disease without esophagitis 11/15/2020   Unspecified open-angle glaucoma, moderate stage 11/15/2020   Reason for consultation 11/15/2020   CAD in native artery 02/27/2019   Arthrofibrosis of total knee arthroplasty, sequela 09/30/2017   SIRS (systemic inflammatory response syndrome) (HCC) 05/31/2017   DM2 (diabetes mellitus, type 2) (HCC)  05/31/2017   HTN (hypertension) 05/31/2017   S/P knee replacement 05/28/2017   Past Medical History:  Diagnosis Date   Aortic valve stenosis, moderate    per echo 06-02-2017 (in epic)  ef 55-60%,  possible bicuspid AV,  severeely thickened and calcified with severely restricted leaflet opening,  valve area 1.17cm^2, mean gradient , peak gradient ,  moderate AR   Arthritis    oa   BPH associated with nocturia    Cancer (HCC)    prostate cancer   Complication of anesthesia    due to heart murmur  mod to severe aortic stenosis   Coronary artery disease    Full dentures    Glaucoma, both eyes    Heart murmur, systolic    History of kidney stones    Hypertension    Left ureteral stone    Myocardial infarction (HCC)    mild in 11-2017 at [facility] F/U from [place] on chart   Peripheral vascular disease (HCC)    SIRS (systemic inflammatory response syndrome) (HCC) 05/31/2017   hospital admission--  unknown etiology   Type 2 diabetes mellitus (HCC)    Wears glasses     Past Surgical History:  Procedure Laterality Date   AORTIC VALVE REPLACEMENT     COLONOSCOPY     CORONARY ARTERY BYPASS GRAFT     pt reports Aortic valve replacement and CABG in 2020   CYSTOSCOPY WITH RETROGRADE PYELOGRAM, URETEROSCOPY AND STENT PLACEMENT Left 02/04/2018   Procedure: CYSTOSCOPY WITH RETROGRADE PYELOGRAM, URETEROSCOPY AND STENT EXCHANGE,BASKETING OF STONE;  Surgeon: Sebastian Ache, MD;  Location: [facility];  Service: Urology;  Laterality: Left;   ENDARTERECTOMY FEMORAL Right 10/02/2021   Procedure: RIGHT COMMON FEMORAL ENDARTERECTOMY WITH 1CM X 14CM [name];  Surgeon: Victorino Sparrow, MD;  Location: [facility] OR;  Service: Vascular;  Laterality: Right;   EXAM UNDER ANESTHESIA WITH MANIPULATION OF KNEE Left 09/30/2017   Procedure: EXAM UNDER ANESTHESIA WITH MANIPULATION OF KNEE;  Surgeon: Eugenia Mcalpine, MD;  Location: [facility];  Service: Orthopedics;  Laterality: Left;  30 mins   INSERTION OF  ILIAC STENT Right 10/02/2021   Procedure: INSERTION OF RIGHT ILIAC STENT;  Surgeon: Victorino Sparrow, MD;  Location: [facility] OR;  Service: Vascular;  Laterality: Right;   PATCH ANGIOPLASTY Right 10/02/2021   Procedure: PATCH ANGIOPLASTY;  Surgeon: Victorino Sparrow, MD;  Location: [facility] OR;  Service: Vascular;  Laterality: Right;   PROSTATE BIOPSY  07/2017   cancerous polyps x2  watching psa dr Berneice Heinrich at [facility]   TOTAL KNEE ARTHROPLASTY Left 05/28/2017   Procedure: LEFT TOTAL KNEE ARTHROPLASTY;  Surgeon: Eugenia Mcalpine, MD;  Location: [facility];  Service: Orthopedics;  Laterality: Left;   TRANSTHORACIC ECHOCARDIOGRAM  06/02/2017   ef 55-60%/  possible AV bicuspid,  severely thickened and calcified leaflets with severely restricted leaflet opening, moderate regurg.(valve area 1.17cm^2,  mean gradient , peak gradient 41mmHg)/  mild LAE/  mild TR/  mild MV thickened leaflets and regurg. (not restricted and no stenosis   VASCULAR SURGERY      Current Outpatient Medications  Medication Sig Dispense Refill Last Dose   acetaminophen (TYLENOL) 500 MG tablet Take 1,000 mg by mouth every 6 (six) hours as needed for moderate pain.       aspirin 81 MG chewable tablet Chew 81 mg by mouth daily.      atorvastatin (LIPITOR) 80 MG tablet Take 80 mg by mouth at bedtime.  2    Brinzolamide-Brimonidine 1-0.2 % SUSP Place 1 drop into both eyes 2 (two) times daily.       carvedilol (COREG) 25 MG tablet Take 12.5 mg by mouth 2 (two) times daily with a meal.      cetirizine (ZYRTEC) 10 MG tablet Take 10 mg by mouth daily.      chlorthalidone (HYGROTON) 25 MG tablet Take 12.5 mg by mouth daily.      cholecalciferol (VITAMIN D3) 25 MCG (1000 UNIT) tablet Take 1,000 Units by mouth daily.      ezetimibe (ZETIA) 10 MG tablet Take 10 mg by mouth daily.      finasteride (PROSCAR) 5 MG tablet Take 5 mg by mouth every morning.   3    guaiFENesin (MUCINEX) 600 MG 12 hr tablet Take 600 mg by mouth 2 (two) times daily.       latanoprost (XALATAN) 0.005 % ophthalmic solution Place 1 drop into both eyes at bedtime.       Lidocaine HCl (PAIN RELIEF ROLL-ON EX) Apply 1 Application topically daily as needed (pain).      lisinopril (ZESTRIL) 5 MG tablet Take 5 mg by mouth daily.      metFORMIN (GLUCOPHAGE-XR) 500 MG 24 hr tablet Take 1,000 mg by mouth 2 (two) times daily.      omeprazole (PRILOSEC) 20 MG capsule Take 20 mg by mouth daily as needed (acid reflux).      Semaglutide, 1 MG/DOSE, (OZEMPIC, 1 MG/DOSE,) 2 MG/1.5ML SOPN Inject 1 mg into the skin every Monday.      tamsulosin (FLOMAX) 0.4 MG CAPS capsule Take 0.4 mg by mouth at bedtime.       No current facility-administered medications for this visit.   Allergies  Allergen Reactions   Atorvastatin Other (See Comments)    Muscle pain  Other     BLOOD PRO{DUCT REFUSAL   Pravastatin Other (See Comments)    Muscle pain   Latex Hives   Rosuvastatin Other (See Comments)    Muscle pain   Tape Hives    Latex tape    Social History   Tobacco Use   Smoking status: Former    Current packs/day: 0.00    Types: Cigarettes    Start date: 01/27/1965    Quit date: 01/28/1995    Years since quitting: 27.8    Passive exposure: Never   Smokeless tobacco: Never  Substance Use Topics   Alcohol use: No    No family history on file.   Review of Systems  Musculoskeletal:  Positive for arthralgias, gait problem and joint swelling.  All other systems reviewed and are negative.   Objective:  Physical Exam Constitutional:      Appearance: Normal appearance.  HENT:     Head: Normocephalic and atraumatic.     Nose: Nose normal.     Mouth/Throat:     Mouth: Mucous membranes are moist.     Pharynx: Oropharynx is clear.  Eyes:     Conjunctiva/sclera: Conjunctivae normal.  Cardiovascular:     Rate and Rhythm: Normal rate and regular rhythm.     Pulses:          Dorsalis pedis pulses are detected w/ Doppler on the right side and detected w/ Doppler on the left  side.     Heart sounds: Normal heart sounds.  Pulmonary:     Effort: Pulmonary effort is normal.     Breath sounds: Normal breath sounds.  Abdominal:     General: Abdomen is flat.     Palpations: Abdomen is soft.  Genitourinary:    Comments: deferred Musculoskeletal:     Cervical back: Normal range of motion and neck supple.     Comments: Examination of the right knee reveals no skin wounds or lesions. No swelling, trace effusion. No warmth or erythema. Tenderness to palpation medial joint line, lateral joint line, and peripatellar retinacular tissues with a positive grind sign. Range of motion 5 to 110 degrees without any ligamentous instability. Crepitation with range of motion. Painless range of motion of the hip.   Distally, patient's foot is warm and well-perfused. He does not have palpable pedal pulses. No focal motor or sensory deficit.  Ambulates with an antalgic gait.  Skin:    General: Skin is warm and dry.     Capillary Refill: Capillary refill takes less than 2 seconds.  Neurological:     General: No focal deficit present.     Mental Status: He is alert and oriented to person, place, and time.  Psychiatric:        Mood and Affect: Mood normal.        Behavior: Behavior normal.        Thought Content: Thought content normal.        Judgment: Judgment normal.     Vital signs in last 24 hours: @VSRANGES @  Labs:   Estimated body mass index is 23.92 kg/m as calculated from the following:   Height as of 12/11/22: 5\' 9"  (1.753 m).   Weight as of 12/11/22: 73.5 kg.   Imaging Review Plain radiographs demonstrate severe degenerative joint disease of the right knee(s). The overall alignment issignificant varus. The bone quality appears to be adequate for age and reported activity level.      Assessment/Plan:  End stage arthritis, right knee  The patient history, physical examination, clinical judgment of the provider and imaging studies are consistent with end  stage degenerative joint disease of the right knee(s) and total knee arthroplasty is deemed medically necessary. The treatment options including medical management, injection therapy arthroscopy and arthroplasty were discussed at length. The risks and benefits of total knee arthroplasty were presented and reviewed. The risks due to aseptic loosening, infection, stiffness, patella tracking problems, thromboembolic complications and other imponderables were discussed. The patient acknowledged the explanation, agreed to proceed with the plan and consent was signed. Patient is being admitted for inpatient treatment for surgery, pain control, PT, OT, prophylactic antibiotics, VTE prophylaxis, progressive ambulation and ADL's and discharge planning. The patient is planning to be discharged home with OPPT after an overnight stay.   Therapy Plans: outpatient therapy. 1st PT appt with [facility] 12/21/22. Disposition: Home with wife Planned DVT Prophylaxis: aspirin 81mg  BID DME needed: walker. Has ice machine.  PCP: Cleared.  Cardiology: Cleared. Okay to hold aspirin 5 days if needed for surgery, otherwise would continue.  Vascular: Cleared. Continue aspirin.  TXA: IV Allergies:  - Latex - itching Anesthesia Concerns: None.  BMI: 24.4 Last HgbA1c: 7.1 Other: - CAD - Aspirin 81mg  baseline.  - T2DM, metformin.  - Semaglutide, has held, can resume after  - History left TKA. - PAD, dopplerable pulses.  - Jehovah's witness, does not accept blood.  - Oxycodone, zofran.  - NO NSAIDs.  - [facility], [place] for 1st Rx.  - 12/11/22: K+ 4.2, Cr. 1.30, Hgb 13.7, .     Patient's anticipated LOS is less than 2 midnights, meeting these requirements: - Younger than 2 - Lives within 1 hour of care - Has a competent adult at home to recover with post-op recover - NO history of  - Chronic pain requiring opiods  - Diabetes  - Coronary Artery Disease  - Heart failure  - Heart attack  - Stroke  -  DVT/VTE  - Cardiac arrhythmia  - Respiratory Failure/COPD  - Renal failure  - Anemia  - Advanced Liver disease

## 2022-12-16 ENCOUNTER — Ambulatory Visit (HOSPITAL_COMMUNITY): Payer: No Typology Code available for payment source

## 2022-12-16 ENCOUNTER — Observation Stay (HOSPITAL_COMMUNITY)
Admission: RE | Admit: 2022-12-16 | Discharge: 2022-12-17 | Disposition: A | Discharge status: Home / Self Care | Payer: No Typology Code available for payment source | Attending: Orthopedic Surgery | Admitting: Orthopedic Surgery

## 2022-12-16 ENCOUNTER — Other Ambulatory Visit: Payer: Self-pay

## 2022-12-16 ENCOUNTER — Encounter (HOSPITAL_COMMUNITY)
Admission: RE | Disposition: A | Discharge status: Home / Self Care | Payer: Self-pay | Source: Home / Self Care | Attending: Orthopedic Surgery

## 2022-12-16 ENCOUNTER — Ambulatory Visit (HOSPITAL_COMMUNITY): Payer: No Typology Code available for payment source | Admitting: Physician Assistant

## 2022-12-16 ENCOUNTER — Observation Stay (HOSPITAL_COMMUNITY): Payer: No Typology Code available for payment source

## 2022-12-16 ENCOUNTER — Encounter (HOSPITAL_COMMUNITY): Payer: Self-pay | Admitting: Orthopedic Surgery

## 2022-12-16 DIAGNOSIS — N189 Chronic kidney disease, unspecified: Secondary | ICD-10-CM | POA: Diagnosis not present

## 2022-12-16 DIAGNOSIS — M1711 Unilateral primary osteoarthritis, right knee: Secondary | ICD-10-CM | POA: Diagnosis not present

## 2022-12-16 DIAGNOSIS — Z01818 Encounter for other preprocedural examination: Secondary | ICD-10-CM

## 2022-12-16 DIAGNOSIS — Z79899 Other long term (current) drug therapy: Secondary | ICD-10-CM | POA: Diagnosis not present

## 2022-12-16 DIAGNOSIS — E1122 Type 2 diabetes mellitus with diabetic chronic kidney disease: Secondary | ICD-10-CM | POA: Insufficient documentation

## 2022-12-16 DIAGNOSIS — I129 Hypertensive chronic kidney disease with stage 1 through stage 4 chronic kidney disease, or unspecified chronic kidney disease: Secondary | ICD-10-CM | POA: Diagnosis not present

## 2022-12-16 DIAGNOSIS — I2510 Atherosclerotic heart disease of native coronary artery without angina pectoris: Secondary | ICD-10-CM | POA: Diagnosis not present

## 2022-12-16 DIAGNOSIS — Z7984 Long term (current) use of oral hypoglycemic drugs: Secondary | ICD-10-CM | POA: Diagnosis not present

## 2022-12-16 DIAGNOSIS — Z7982 Long term (current) use of aspirin: Secondary | ICD-10-CM | POA: Insufficient documentation

## 2022-12-16 DIAGNOSIS — E119 Type 2 diabetes mellitus without complications: Secondary | ICD-10-CM

## 2022-12-16 DIAGNOSIS — Z91040 Latex allergy status: Secondary | ICD-10-CM | POA: Insufficient documentation

## 2022-12-16 DIAGNOSIS — Z87891 Personal history of nicotine dependence: Secondary | ICD-10-CM | POA: Insufficient documentation

## 2022-12-16 DIAGNOSIS — I1310 Hypertensive heart and chronic kidney disease without heart failure, with stage 1 through stage 4 chronic kidney disease, or unspecified chronic kidney disease: Secondary | ICD-10-CM | POA: Insufficient documentation

## 2022-12-16 DIAGNOSIS — Z8546 Personal history of malignant neoplasm of prostate: Secondary | ICD-10-CM | POA: Diagnosis not present

## 2022-12-16 DIAGNOSIS — Z96652 Presence of left artificial knee joint: Secondary | ICD-10-CM | POA: Insufficient documentation

## 2022-12-16 DIAGNOSIS — Z951 Presence of aortocoronary bypass graft: Secondary | ICD-10-CM | POA: Insufficient documentation

## 2022-12-16 DIAGNOSIS — Z96651 Presence of right artificial knee joint: Secondary | ICD-10-CM

## 2022-12-16 HISTORY — PX: KNEE ARTHROPLASTY: SHX992

## 2022-12-16 LAB — GLUCOSE, CAPILLARY
Glucose-Capillary: 131 mg/dL — ABNORMAL HIGH (ref 70–99)
Glucose-Capillary: 139 mg/dL — ABNORMAL HIGH (ref 70–99)
Glucose-Capillary: 213 mg/dL — ABNORMAL HIGH (ref 70–99)

## 2022-12-16 SURGERY — ARTHROPLASTY, KNEE, TOTAL, USING IMAGELESS COMPUTER-ASSISTED NAVIGATION
Anesthesia: Spinal | Site: Knee | Laterality: Right

## 2022-12-16 MED ORDER — OXYCODONE HCL 5 MG PO TABS: 5.0000 mg | ORAL_TABLET | Freq: Once | ORAL | Status: DC | PRN

## 2022-12-16 MED ORDER — ASPIRIN 81 MG PO CHEW
81.0000 mg | CHEWABLE_TABLET | Freq: Two times a day (BID) | ORAL | Status: DC
  Administered 2022-12-16 – 2022-12-17 (×2): 81 mg via ORAL
  Filled 2022-12-16 (×2): qty 1

## 2022-12-16 MED ORDER — ORAL CARE MOUTH RINSE: 15.0000 mL | Freq: Once | OROMUCOSAL | Status: AC

## 2022-12-16 MED ORDER — POVIDONE-IODINE 10 % EX SWAB: 2.0000 | Freq: Once | CUTANEOUS | Status: AC

## 2022-12-16 MED ORDER — CEFAZOLIN SODIUM-DEXTROSE 2-4 GM/100ML-% IV SOLN
2.0000 g | INTRAVENOUS | Status: AC
  Administered 2022-12-16: 2 g via INTRAVENOUS
  Filled 2022-12-16: qty 100

## 2022-12-16 MED ORDER — OXYCODONE HCL 5 MG PO TABS
5.0000 mg | ORAL_TABLET | ORAL | Status: DC | PRN
  Administered 2022-12-17 (×2): 10 mg via ORAL
  Filled 2022-12-16 (×2): qty 2

## 2022-12-16 MED ORDER — ATORVASTATIN CALCIUM 40 MG PO TABS
80.0000 mg | ORAL_TABLET | Freq: Every day | ORAL | Status: DC
  Administered 2022-12-16: 80 mg via ORAL
  Filled 2022-12-16: qty 2

## 2022-12-16 MED ORDER — SODIUM CHLORIDE (PF) 0.9 % IJ SOLN
INTRAMUSCULAR | Status: DC | PRN
  Administered 2022-12-16: 30 mL

## 2022-12-16 MED ORDER — CARVEDILOL 12.5 MG PO TABS
12.5000 mg | ORAL_TABLET | Freq: Two times a day (BID) | ORAL | Status: DC
  Administered 2022-12-17: 12.5 mg via ORAL
  Filled 2022-12-16: qty 1

## 2022-12-16 MED ORDER — KETOROLAC TROMETHAMINE 30 MG/ML IJ SOLN
INTRAMUSCULAR | Status: AC
  Filled 2022-12-16: qty 1

## 2022-12-16 MED ORDER — PRONTOSAN WOUND IRRIGATION OPTIME
TOPICAL | Status: DC | PRN
  Administered 2022-12-16: 1 via TOPICAL

## 2022-12-16 MED ORDER — LATANOPROST 0.005 % OP SOLN
1.0000 [drp] | Freq: Every day | OPHTHALMIC | Status: DC
  Administered 2022-12-16: 1 [drp] via OPHTHALMIC
  Filled 2022-12-16: qty 2.5

## 2022-12-16 MED ORDER — TRANEXAMIC ACID-NACL 1000-0.7 MG/100ML-% IV SOLN
1000.0000 mg | INTRAVENOUS | Status: AC
  Administered 2022-12-16: 1000 mg via INTRAVENOUS
  Filled 2022-12-16: qty 100

## 2022-12-16 MED ORDER — METOCLOPRAMIDE HCL 5 MG/ML IJ SOLN: 5.0000 mg | Freq: Three times a day (TID) | INTRAMUSCULAR | Status: DC | PRN

## 2022-12-16 MED ORDER — PANTOPRAZOLE SODIUM 40 MG PO TBEC
40.0000 mg | DELAYED_RELEASE_TABLET | Freq: Every day | ORAL | Status: DC
  Administered 2022-12-17: 40 mg via ORAL
  Filled 2022-12-16: qty 1

## 2022-12-16 MED ORDER — CARVEDILOL 12.5 MG PO TABS: 25.0000 mg | ORAL_TABLET | Freq: Once | ORAL | Status: DC

## 2022-12-16 MED ORDER — ONDANSETRON HCL 4 MG/2ML IJ SOLN
INTRAMUSCULAR | Status: DC | PRN
  Administered 2022-12-16: 4 mg via INTRAVENOUS

## 2022-12-16 MED ORDER — LORATADINE 10 MG PO TABS
10.0000 mg | ORAL_TABLET | Freq: Every day | ORAL | Status: DC
  Administered 2022-12-17: 10 mg via ORAL
  Filled 2022-12-16: qty 1

## 2022-12-16 MED ORDER — PROMETHAZINE HCL 25 MG/ML IJ SOLN: 6.2500 mg | INTRAMUSCULAR | Status: DC | PRN

## 2022-12-16 MED ORDER — ISOPROPYL ALCOHOL 70 % SOLN
Status: DC | PRN
  Administered 2022-12-16: 1 via TOPICAL

## 2022-12-16 MED ORDER — PHENYLEPHRINE HCL-NACL 20-0.9 MG/250ML-% IV SOLN
INTRAVENOUS | Status: DC | PRN
  Administered 2022-12-16: 30 ug/min via INTRAVENOUS

## 2022-12-16 MED ORDER — ISOPROPYL ALCOHOL 70 % SOLN
Status: AC
  Filled 2022-12-16: qty 480

## 2022-12-16 MED ORDER — FENTANYL CITRATE PF 50 MCG/ML IJ SOSY
25.0000 ug | PREFILLED_SYRINGE | INTRAMUSCULAR | Status: AC
  Administered 2022-12-16: 50 ug via INTRAVENOUS
  Filled 2022-12-16: qty 2

## 2022-12-16 MED ORDER — BUPIVACAINE IN DEXTROSE 0.75-8.25 % IT SOLN
INTRATHECAL | Status: DC | PRN
  Administered 2022-12-16: 13.5 mg via INTRATHECAL

## 2022-12-16 MED ORDER — METHOCARBAMOL 500 MG PO TABS: 500.0000 mg | ORAL_TABLET | Freq: Four times a day (QID) | ORAL | Status: DC | PRN

## 2022-12-16 MED ORDER — 0.9 % SODIUM CHLORIDE (POUR BTL) OPTIME
TOPICAL | Status: DC | PRN
  Administered 2022-12-16: 1000 mL

## 2022-12-16 MED ORDER — DIPHENHYDRAMINE HCL 12.5 MG/5ML PO ELIX: 12.5000 mg | ORAL_SOLUTION | ORAL | Status: DC | PRN

## 2022-12-16 MED ORDER — BUPIVACAINE-EPINEPHRINE 0.5% -1:200000 IJ SOLN
INTRAMUSCULAR | Status: DC | PRN
  Administered 2022-12-16: 30 mL

## 2022-12-16 MED ORDER — ACETAMINOPHEN 500 MG PO TABS
1000.0000 mg | ORAL_TABLET | Freq: Four times a day (QID) | ORAL | Status: DC
  Administered 2022-12-16 – 2022-12-17 (×3): 1000 mg via ORAL
  Filled 2022-12-16 (×3): qty 2

## 2022-12-16 MED ORDER — TAMSULOSIN HCL 0.4 MG PO CAPS
0.4000 mg | ORAL_CAPSULE | Freq: Every day | ORAL | Status: DC
  Administered 2022-12-16: 0.4 mg via ORAL
  Filled 2022-12-16: qty 1

## 2022-12-16 MED ORDER — DEXAMETHASONE SODIUM PHOSPHATE 10 MG/ML IJ SOLN
INTRAMUSCULAR | Status: AC
  Filled 2022-12-16: qty 1

## 2022-12-16 MED ORDER — ROPIVACAINE HCL 7.5 MG/ML IJ SOLN
INTRAMUSCULAR | Status: DC | PRN
  Administered 2022-12-16: 20 mL via PERINEURAL

## 2022-12-16 MED ORDER — METHOCARBAMOL 1000 MG/10ML IJ SOLN: 500.0000 mg | Freq: Four times a day (QID) | INTRAVENOUS | Status: DC | PRN

## 2022-12-16 MED ORDER — LACTATED RINGERS IV SOLN: INTRAVENOUS | Status: DC

## 2022-12-16 MED ORDER — FINASTERIDE 5 MG PO TABS
5.0000 mg | ORAL_TABLET | Freq: Every morning | ORAL | Status: DC
  Administered 2022-12-17: 5 mg via ORAL
  Filled 2022-12-16: qty 1

## 2022-12-16 MED ORDER — PROPOFOL 500 MG/50ML IV EMUL
INTRAVENOUS | Status: DC | PRN
  Administered 2022-12-16: 70 ug/kg/min via INTRAVENOUS

## 2022-12-16 MED ORDER — SENNA 8.6 MG PO TABS
1.0000 | ORAL_TABLET | Freq: Two times a day (BID) | ORAL | Status: DC
  Administered 2022-12-16 – 2022-12-17 (×2): 8.6 mg via ORAL
  Filled 2022-12-16 (×2): qty 1

## 2022-12-16 MED ORDER — ONDANSETRON HCL 4 MG/2ML IJ SOLN
INTRAMUSCULAR | Status: AC
  Filled 2022-12-16: qty 2

## 2022-12-16 MED ORDER — SODIUM CHLORIDE 0.9 % IR SOLN
Status: DC | PRN
  Administered 2022-12-16: 3000 mL
  Administered 2022-12-16: 1000 mL

## 2022-12-16 MED ORDER — OXYCODONE HCL 5 MG PO TABS: 10.0000 mg | ORAL_TABLET | ORAL | Status: DC | PRN

## 2022-12-16 MED ORDER — ALUM & MAG HYDROXIDE-SIMETH 200-200-20 MG/5ML PO SUSP: 30.0000 mL | ORAL | Status: DC | PRN

## 2022-12-16 MED ORDER — GLUCERNA SHAKE PO LIQD
237.0000 mL | Freq: Three times a day (TID) | ORAL | Status: DC
  Filled 2022-12-16 (×3): qty 237

## 2022-12-16 MED ORDER — STERILE WATER FOR IRRIGATION IR SOLN
Status: DC | PRN
  Administered 2022-12-16: 2000 mL

## 2022-12-16 MED ORDER — PROPOFOL 1000 MG/100ML IV EMUL
INTRAVENOUS | Status: AC
  Filled 2022-12-16: qty 100

## 2022-12-16 MED ORDER — SODIUM CHLORIDE (PF) 0.9 % IJ SOLN
INTRAMUSCULAR | Status: AC
  Filled 2022-12-16: qty 50

## 2022-12-16 MED ORDER — HYDROMORPHONE HCL 1 MG/ML IJ SOLN: 0.2500 mg | INTRAMUSCULAR | Status: DC | PRN

## 2022-12-16 MED ORDER — OXYCODONE HCL 5 MG/5ML PO SOLN: 5.0000 mg | Freq: Once | ORAL | Status: DC | PRN

## 2022-12-16 MED ORDER — VITAMIN D 25 MCG (1000 UNIT) PO TABS
1000.0000 [IU] | ORAL_TABLET | Freq: Every day | ORAL | Status: DC
  Administered 2022-12-17: 1000 [IU] via ORAL
  Filled 2022-12-16: qty 1

## 2022-12-16 MED ORDER — PHENOL 1.4 % MT LIQD: 1.0000 | OROMUCOSAL | Status: DC | PRN

## 2022-12-16 MED ORDER — CHLORTHALIDONE 25 MG PO TABS
12.5000 mg | ORAL_TABLET | Freq: Every day | ORAL | Status: DC
  Administered 2022-12-17: 12.5 mg via ORAL
  Filled 2022-12-16: qty 1

## 2022-12-16 MED ORDER — DEXAMETHASONE SODIUM PHOSPHATE 4 MG/ML IJ SOLN
INTRAMUSCULAR | Status: DC | PRN
  Administered 2022-12-16: 8 mg via INTRAVENOUS

## 2022-12-16 MED ORDER — ACETAMINOPHEN 500 MG PO TABS
1000.0000 mg | ORAL_TABLET | Freq: Once | ORAL | Status: AC
  Administered 2022-12-16: 1000 mg via ORAL
  Filled 2022-12-16: qty 2

## 2022-12-16 MED ORDER — HYDROMORPHONE HCL 1 MG/ML IJ SOLN: 0.5000 mg | INTRAMUSCULAR | Status: DC | PRN

## 2022-12-16 MED ORDER — PROPOFOL 10 MG/ML IV BOLUS
INTRAVENOUS | Status: DC | PRN
  Administered 2022-12-16 (×2): 20 mg via INTRAVENOUS

## 2022-12-16 MED ORDER — POLYETHYLENE GLYCOL 3350 17 G PO PACK: 17.0000 g | PACK | Freq: Every day | ORAL | Status: DC | PRN

## 2022-12-16 MED ORDER — INSULIN ASPART 100 UNIT/ML IJ SOLN
0.0000 [IU] | Freq: Every day | INTRAMUSCULAR | Status: DC
  Administered 2022-12-16: 2 [IU] via SUBCUTANEOUS

## 2022-12-16 MED ORDER — INSULIN ASPART 100 UNIT/ML IJ SOLN: 0.0000 [IU] | INTRAMUSCULAR | Status: DC | PRN

## 2022-12-16 MED ORDER — SODIUM CHLORIDE 0.9 % IV SOLN: INTRAVENOUS | Status: DC

## 2022-12-16 MED ORDER — CEFAZOLIN SODIUM-DEXTROSE 2-4 GM/100ML-% IV SOLN
2.0000 g | Freq: Four times a day (QID) | INTRAVENOUS | Status: AC
  Administered 2022-12-16 – 2022-12-17 (×2): 2 g via INTRAVENOUS
  Filled 2022-12-16 (×2): qty 100

## 2022-12-16 MED ORDER — ACETAMINOPHEN 500 MG PO TABS: 1000.0000 mg | ORAL_TABLET | Freq: Once | ORAL | Status: DC

## 2022-12-16 MED ORDER — MIDAZOLAM HCL 2 MG/2ML IJ SOLN: 0.5000 mg | Freq: Once | INTRAMUSCULAR | Status: DC | PRN

## 2022-12-16 MED ORDER — DOCUSATE SODIUM 100 MG PO CAPS
100.0000 mg | ORAL_CAPSULE | Freq: Two times a day (BID) | ORAL | Status: DC
  Administered 2022-12-16 – 2022-12-17 (×2): 100 mg via ORAL
  Filled 2022-12-16 (×2): qty 1

## 2022-12-16 MED ORDER — ONDANSETRON HCL 4 MG/2ML IJ SOLN: 4.0000 mg | Freq: Four times a day (QID) | INTRAMUSCULAR | Status: DC | PRN

## 2022-12-16 MED ORDER — ACETAMINOPHEN 325 MG PO TABS: 325.0000 mg | ORAL_TABLET | Freq: Four times a day (QID) | ORAL | Status: DC | PRN

## 2022-12-16 MED ORDER — METOCLOPRAMIDE HCL 5 MG PO TABS: 5.0000 mg | ORAL_TABLET | Freq: Three times a day (TID) | ORAL | Status: DC | PRN

## 2022-12-16 MED ORDER — GUAIFENESIN ER 600 MG PO TB12
600.0000 mg | ORAL_TABLET | Freq: Two times a day (BID) | ORAL | Status: DC
  Administered 2022-12-16 – 2022-12-17 (×2): 600 mg via ORAL
  Filled 2022-12-16 (×2): qty 1

## 2022-12-16 MED ORDER — MENTHOL 3 MG MT LOZG: 1.0000 | LOZENGE | OROMUCOSAL | Status: DC | PRN

## 2022-12-16 MED ORDER — MIDAZOLAM HCL 2 MG/2ML IJ SOLN
1.0000 mg | INTRAMUSCULAR | Status: AC
  Administered 2022-12-16: 1 mg via INTRAVENOUS
  Filled 2022-12-16: qty 2

## 2022-12-16 MED ORDER — CHLORHEXIDINE GLUCONATE 0.12 % MT SOLN
15.0000 mL | Freq: Once | OROMUCOSAL | Status: AC
  Administered 2022-12-16: 15 mL via OROMUCOSAL

## 2022-12-16 MED ORDER — BUPIVACAINE-EPINEPHRINE (PF) 0.5% -1:200000 IJ SOLN
INTRAMUSCULAR | Status: AC
  Filled 2022-12-16: qty 30

## 2022-12-16 MED ORDER — EZETIMIBE 10 MG PO TABS
10.0000 mg | ORAL_TABLET | Freq: Every day | ORAL | Status: DC
  Administered 2022-12-17: 10 mg via ORAL
  Filled 2022-12-16: qty 1

## 2022-12-16 MED ORDER — CARVEDILOL 12.5 MG PO TABS
12.5000 mg | ORAL_TABLET | Freq: Once | ORAL | Status: AC
  Administered 2022-12-16: 12.5 mg via ORAL
  Filled 2022-12-16: qty 1

## 2022-12-16 MED ORDER — INSULIN ASPART 100 UNIT/ML IJ SOLN
0.0000 [IU] | Freq: Three times a day (TID) | INTRAMUSCULAR | Status: DC
  Administered 2022-12-17 (×2): 1 [IU] via SUBCUTANEOUS

## 2022-12-16 MED ORDER — KETOROLAC TROMETHAMINE 30 MG/ML IJ SOLN
INTRAMUSCULAR | Status: DC | PRN
  Administered 2022-12-16: 30 mg

## 2022-12-16 MED ORDER — ONDANSETRON HCL 4 MG PO TABS: 4.0000 mg | ORAL_TABLET | Freq: Four times a day (QID) | ORAL | Status: DC | PRN

## 2022-12-16 MED ORDER — BISACODYL 10 MG RE SUPP: 10.0000 mg | Freq: Every day | RECTAL | Status: DC | PRN

## 2022-12-16 SURGICAL SUPPLY — 73 items
ADH SKN CLS APL DERMABOND .7 (GAUZE/BANDAGES/DRESSINGS) ×2
APL PRP STRL LF DISP 70% ISPRP (MISCELLANEOUS) ×2
BAG COUNTER SPONGE SURGICOUNT (BAG) IMPLANT
BAG SPEC THK2 15X12 ZIP CLS (MISCELLANEOUS)
BAG SPNG CNTER NS LX DISP (BAG)
BAG ZIPLOCK 12X15 (MISCELLANEOUS) IMPLANT
BATTERY INSTRU NAVIGATION (MISCELLANEOUS) ×3 IMPLANT
BLADE SAW RECIPROCATING 77.5 (BLADE) ×1 IMPLANT
BNDG CMPR 5X4 KNIT ELC UNQ LF (GAUZE/BANDAGES/DRESSINGS) ×1
BNDG CMPR 6 X 5 YARDS HK CLSR (GAUZE/BANDAGES/DRESSINGS) ×1
BNDG CMPR 6"X 5 YARDS HK CLSR (GAUZE/BANDAGES/DRESSINGS) ×1
BNDG ELASTIC 4INX 5YD STR LF (GAUZE/BANDAGES/DRESSINGS) ×1 IMPLANT
BNDG ELASTIC 6INX 5YD STR LF (GAUZE/BANDAGES/DRESSINGS) ×1 IMPLANT
BTRY SRG DRVR LF (MISCELLANEOUS) ×3
CHLORAPREP W/TINT 26 (MISCELLANEOUS) ×2 IMPLANT
COMP FEM PS KNEE STD 10 RT (Joint) ×1 IMPLANT
COMP PATELLA 3 PEG 38 (Joint) ×1 IMPLANT
COMPONENT FEM PS KN STD 10 RT (Joint) IMPLANT
COMPONENT PATELLA 3 PEG 38 (Joint) IMPLANT
COVER SURGICAL LIGHT HANDLE (MISCELLANEOUS) ×1 IMPLANT
DERMABOND ADVANCED .7 DNX12 (GAUZE/BANDAGES/DRESSINGS) ×2 IMPLANT
DRAPE SHEET LG 3/4 BI-LAMINATE (DRAPES) ×3 IMPLANT
DRAPE U-SHAPE 47X51 STRL (DRAPES) ×1 IMPLANT
DRSG AQUACEL AG ADV 3.5X10 (GAUZE/BANDAGES/DRESSINGS) ×1 IMPLANT
ELECT BLADE TIP CTD 4 INCH (ELECTRODE) ×1 IMPLANT
ELECT REM PT RETURN 15FT ADLT (MISCELLANEOUS) ×1 IMPLANT
GAUZE SPONGE 4X4 12PLY STRL (GAUZE/BANDAGES/DRESSINGS) ×1 IMPLANT
GLOVE BIO SURGEON STRL SZ7 (GLOVE) ×1 IMPLANT
GLOVE BIO SURGEON STRL SZ8.5 (GLOVE) ×2 IMPLANT
GLOVE BIOGEL PI IND STRL 7.5 (GLOVE) ×1 IMPLANT
GLOVE BIOGEL PI IND STRL 8.5 (GLOVE) ×1 IMPLANT
GOWN SPEC L3 XXLG W/TWL (GOWN DISPOSABLE) ×1 IMPLANT
GOWN STRL REUS W/ TWL XL LVL3 (GOWN DISPOSABLE) ×1 IMPLANT
GOWN STRL REUS W/TWL XL LVL3 (GOWN DISPOSABLE) ×1
HANDPIECE INTERPULSE COAX TIP (DISPOSABLE) ×1
HOLDER FOLEY CATH W/STRAP (MISCELLANEOUS) ×1 IMPLANT
HOOD PEEL AWAY T7 (MISCELLANEOUS) ×3 IMPLANT
INSERT TIB KNEE EF/3-11 11 RT (Insert) IMPLANT
KIT TURNOVER KIT A (KITS) IMPLANT
MARKER SKIN DUAL TIP RULER LAB (MISCELLANEOUS) ×1 IMPLANT
NDL SAFETY ECLIP 18X1.5 (MISCELLANEOUS) ×1 IMPLANT
NDL SPNL 18GX3.5 QUINCKE PK (NEEDLE) ×1 IMPLANT
NEEDLE SPNL 18GX3.5 QUINCKE PK (NEEDLE) ×1 IMPLANT
NS IRRIG 1000ML POUR BTL (IV SOLUTION) ×1 IMPLANT
PACK TOTAL KNEE CUSTOM (KITS) ×1 IMPLANT
PADDING CAST COTTON 6X4 STRL (CAST SUPPLIES) ×1 IMPLANT
PROS TIB KNEE PS 0D F RT (Joint) ×1 IMPLANT
PROSTHESIS TIB KNEE PS 0D F RT (Joint) IMPLANT
PROTECTOR NERVE ULNAR (MISCELLANEOUS) ×1 IMPLANT
SAW OSC TIP CART 19.5X105X1.3 (SAW) ×1 IMPLANT
SCREW FEMALE HEX FIX 25X2.5 (ORTHOPEDIC DISPOSABLE SUPPLIES) IMPLANT
SEALER BIPOLAR AQUA 6.0 (INSTRUMENTS) ×1 IMPLANT
SET HNDPC FAN SPRY TIP SCT (DISPOSABLE) ×1 IMPLANT
SET PAD KNEE POSITIONER (MISCELLANEOUS) ×1 IMPLANT
SOLUTION PRONTOSAN WOUND 350ML (IRRIGATION / IRRIGATOR) IMPLANT
SPIKE FLUID TRANSFER (MISCELLANEOUS) ×2 IMPLANT
STAPLER VISISTAT 35W (STAPLE) IMPLANT
SUT MNCRL AB 3-0 PS2 18 (SUTURE) ×1 IMPLANT
SUT MON AB 2-0 CT1 36 (SUTURE) ×1 IMPLANT
SUT STRATAFIX PDO 1 14 VIOLET (SUTURE) ×1
SUT STRATFX PDO 1 14 VIOLET (SUTURE) ×1
SUT VIC AB 1 CTX 36 (SUTURE) ×2
SUT VIC AB 1 CTX36XBRD ANBCTR (SUTURE) ×2 IMPLANT
SUT VIC AB 2-0 CT1 27 (SUTURE) ×1
SUT VIC AB 2-0 CT1 TAPERPNT 27 (SUTURE) ×1 IMPLANT
SUTURE STRATFX PDO 1 14 VIOLET (SUTURE) ×1 IMPLANT
SYR 3ML LL SCALE MARK (SYRINGE) ×1 IMPLANT
TRAY FOL W/BAG SLVR 16FR STRL (SET/KITS/TRAYS/PACK) IMPLANT
TRAY FOLEY MTR SLVR 16FR STAT (SET/KITS/TRAYS/PACK) IMPLANT
TRAY FOLEY W/BAG SLVR 16FR LF (SET/KITS/TRAYS/PACK) ×1
TUBE SUCTION HIGH CAP CLEAR NV (SUCTIONS) ×1 IMPLANT
WATER STERILE IRR 1000ML POUR (IV SOLUTION) ×2 IMPLANT
WRAP KNEE MAXI GEL POST OP (GAUZE/BANDAGES/DRESSINGS) IMPLANT

## 2022-12-16 NOTE — Discharge Instructions (Signed)
  Dr. Brian Swinteck Total Joint Specialist [facility] [address] [place], [place] [address] [phone]  TOTAL KNEE REPLACEMENT POSTOPERATIVE DIRECTIONS    Knee Rehabilitation, Guidelines Following Surgery  Results after knee surgery are often greatly improved when you follow the exercise, range of motion and muscle strengthening exercises prescribed by your doctor. Safety measures are also important to protect the knee from further injury. Any time any of these exercises cause you to have increased pain or swelling in your knee joint, decrease the amount until you are comfortable again and slowly increase them. If you have problems or questions, call your caregiver or physical therapist for advice.   WEIGHT BEARING Weight bearing as tolerated with assist device (walker, cane, etc) as directed, use it as long as suggested by your surgeon or therapist, typically at least 4-6 weeks.  HOME CARE INSTRUCTIONS  Remove items at home which could result in a fall. This includes throw rugs or furniture in walking pathways.  Continue medications as instructed at time of discharge. You may have some home medications which will be placed on hold until you complete the course of blood thinner medication.  You may start showering once you are discharged home but do not submerge the incision under water. Just pat the incision dry and apply a dry gauze dressing on daily. Walk with walker as instructed.  You may resume a sexual relationship in one month or when given the OK by your doctor.  Use walker as long as suggested by your caregivers. Avoid periods of inactivity such as sitting longer than an hour when not asleep. This helps prevent blood clots.  You may put full weight on your legs and walk as much as is comfortable.  You may return to work once you are cleared by your doctor.  Do not drive a car for 6 weeks or until released by you surgeon.  Do not drive while  taking narcotics.  Wear the elastic stockings for three weeks following surgery during the day but you may remove then at night. Make sure you keep all of your appointments after your operation with all of your doctors and caregivers. You should call the office at the above phone number and make an appointment for approximately two weeks after the date of your surgery. Do not remove your surgical dressing. The dressing is waterproof; you may take showers in 3 days, but do not take tub baths or submerge the dressing. Please pick up a stool softener and laxative for home use as long as you are requiring pain medications. ICE to the affected knee every three hours for 30 minutes at a time and then as needed for pain and swelling.  Continue to use ice on the knee for pain and swelling from surgery. You may notice swelling that will progress down to the foot and ankle.  This is normal after surgery.  Elevate the leg when you are not up walking on it.   It is important for you to complete the blood thinner medication as prescribed by your doctor. Continue to use the breathing machine which will help keep your temperature down.  It is common for your temperature to cycle up and down following surgery, especially at night when you are not up moving around and exerting yourself.  The breathing machine keeps your lungs expanded and your temperature down.  RANGE OF MOTION AND STRENGTHENING EXERCISES  Rehabilitation of the knee is important following a knee injury or an   operation. After just a few days of immobilization, the muscles of the thigh which control the knee become weakened and shrink (atrophy). Knee exercises are designed to build up the tone and strength of the thigh muscles and to improve knee motion. Often times heat used for twenty to thirty minutes before working out will loosen up your tissues and help with improving the range of motion but do not use heat for the first two weeks following surgery.  These exercises can be done on a training (exercise) mat, on the floor, on a table or on a bed. Use what ever works the best and is most comfortable for you Knee exercises include:  Leg Lifts - While your knee is still immobilized in a splint or cast, you can do straight leg raises. Lift the leg to 60 degrees, hold for 3 sec, and slowly lower the leg. Repeat 10-20 times 2-3 times daily. Perform this exercise against resistance later as your knee gets better.  Quad and Hamstring Sets - Tighten up the muscle on the front of the thigh (Quad) and hold for 5-10 sec. Repeat this 10-20 times hourly. Hamstring sets are done by pushing the foot backward against an object and holding for 5-10 sec. Repeat as with quad sets.  A rehabilitation program following serious knee injuries can speed recovery and prevent re-injury in the future due to weakened muscles. Contact your doctor or a physical therapist for more information on knee rehabilitation.   POST-OPERATIVE OPIOID TAPER INSTRUCTIONS: It is important to wean off of your opioid medication as soon as possible. If you do not need pain medication after your surgery it is ok to stop day one. Opioids include: Codeine, Hydrocodone(Norco, Vicodin), Oxycodone(Percocet, oxycontin) and hydromorphone amongst others.  Long term and even short term use of opiods can cause: Increased pain response Dependence Constipation Depression Respiratory depression And more.  Withdrawal symptoms can include Flu like symptoms Nausea, vomiting And more Techniques to manage these symptoms Hydrate well Eat regular healthy meals Stay active Use relaxation techniques(deep breathing, meditating, yoga) Do Not substitute Alcohol to help with tapering If you have been on opioids for less than two weeks and do not have pain than it is ok to stop all together.  Plan to wean off of opioids This plan should start within one week post op of your joint replacement. Maintain the same  interval or time between taking each dose and first decrease the dose.  Cut the total daily intake of opioids by one tablet each day Next start to increase the time between doses. The last dose that should be eliminated is the evening dose.    SKILLED REHAB INSTRUCTIONS: If the patient is transferred to a skilled rehab facility following release from the hospital, a list of the current medications will be sent to the facility for the patient to continue.  When discharged from the skilled rehab facility, please have the facility set up the patient's Home Health Physical Therapy prior to being released. Also, the skilled facility will be responsible for providing the patient with their medications at time of release from the facility to include their pain medication, the muscle relaxants, and their blood thinner medication. If the patient is still at the rehab facility at time of the two week follow up appointment, the skilled rehab facility will also need to assist the patient in arranging follow up appointment in our office and any transportation needs.  MAKE SURE YOU:  Understand these instructions.  Will watch   your condition.  Will get help right away if you are not doing well or get worse.    Pick up stool softner and laxative for home use following surgery while on pain medications. Do NOT remove your dressing. You may shower.  Do not take tub baths or submerge incision under water. May shower starting three days after surgery. Please use a clean towel to pat the incision dry following showers. Continue to use ice for pain and swelling after surgery. Do not use any lotions or creams on the incision until instructed by your surgeon.  

## 2022-12-16 NOTE — Anesthesia Procedure Notes (Signed)
Procedure Name: MAC Date/Time: 12/16/2022 3:48 PM  Performed by: Maurene Capes, CRNAPre-anesthesia Checklist: Patient identified, Emergency Drugs available, Suction available and Patient being monitored Patient Re-evaluated:Patient Re-evaluated prior to induction Oxygen Delivery Method: Simple face mask Induction Type: IV induction Placement Confirmation: positive ETCO2 Dental Injury: Teeth and Oropharynx as per pre-operative assessment

## 2022-12-16 NOTE — Anesthesia Postprocedure Evaluation (Signed)
Anesthesia Post Note  Patient: Jeremy Nicholson  Procedure(s) Performed: COMPUTER ASSISTED TOTAL KNEE ARTHROPLASTY (Right: Knee)     Patient location during evaluation: PACU Anesthesia Type: Spinal Level of consciousness: awake and alert, patient cooperative and oriented Pain management: pain level controlled Vital Signs Assessment: post-procedure vital signs reviewed and stable Respiratory status: nonlabored ventilation, spontaneous breathing and respiratory function stable Cardiovascular status: stable and blood pressure returned to baseline Postop Assessment: no apparent nausea or vomiting, patient able to bend at knees, spinal receding and adequate PO intake Anesthetic complications: no   No notable events documented.  Last Vitals:  Vitals:   12/16/22 1845 12/16/22 1900  BP: 124/75 (!) 141/76  Pulse: 66 86  Resp: 13 18  Temp: 36.5 C   SpO2: 95% 99%    Last Pain:  Vitals:   12/16/22 1900  TempSrc:   PainSc: 0-No pain                 Kobie Matkins,E. Manasvi Dickard

## 2022-12-16 NOTE — Anesthesia Procedure Notes (Signed)
Spinal  Patient location during procedure: OR End time: 12/16/2022 3:53 PM Reason for block: surgical anesthesia Staffing Performed: anesthesiologist  Anesthesiologist: Jairo Ben, MD Performed by: Jairo Ben, MD Authorized by: Jairo Ben, MD   Preanesthetic Checklist Completed: patient identified, IV checked, site marked, risks and benefits discussed, surgical consent, monitors and equipment checked, pre-op evaluation and timeout performed Spinal Block Patient position: sitting Prep: DuraPrep and site prepped and draped Patient monitoring: blood pressure, continuous pulse ox, cardiac monitor and heart rate Approach: midline Location: L3-4 Injection technique: single-shot Needle Needle type: Introducer  Needle gauge: 24 G Needle length: 9 cm Assessment Events: CSF return Additional Notes Pt identified in Operating room.  Monitors applied. Working IV access confirmed. Sterile prep, drape lumbar spine.  1% lido local L 3,4.  #24ga Pencan into clear CSF L 3,4.  13.5 mg 0.75% Bupivacaine with dextrose injected with asp CSF beginning and end of injection.  Patient asymptomatic, VSS, no heme aspirated, tolerated well.  Sandford Craze, MD

## 2022-12-16 NOTE — Anesthesia Procedure Notes (Signed)
Anesthesia Regional Block: Adductor canal block   Pre-Anesthetic Checklist: , timeout performed,  Correct Patient, Correct Site, Correct Laterality,  Correct Procedure, Correct Position, site marked,  Risks and benefits discussed,  Surgical consent,  Pre-op evaluation,  At surgeon's request and post-op pain management  Laterality: Right and Lower  Prep: chloraprep       Needles:  Injection technique: Single-shot  Needle Type: Echogenic Needle     Needle Length: 9cm  Needle Gauge: 21     Additional Needles:   Procedures:,,,, ultrasound used (permanent image in chart),,    Narrative:  Start time: 12/16/2022 1:14 PM End time: 12/16/2022 1:26 PM Injection made incrementally with aspirations every 5 mL.  Performed by: Personally  Anesthesiologist: Jairo Ben, MD  Additional Notes: Pt identified in Holding room.  Monitors applied. Working IV access confirmed. Timeout, Sterile prep R thigh.  #21ga ECHOgenic Arrow block needle into adductor canal with US guidance.  20cc 0.75% Ropivacaine injected incrementally after negative test dose.  Patient asymptomatic, VSS, no heme aspirated, tolerated well.   Sandford Craze, MD

## 2022-12-16 NOTE — Interval H&P Note (Signed)
History and Physical Interval Note:  12/16/2022 2:58 PM  Jeremy Nicholson  has presented today for surgery, with the diagnosis of Right knee osteoarthritis.  The various methods of treatment have been discussed with the patient and family. After consideration of risks, benefits and other options for treatment, the patient has consented to  Procedure(s) with comments: COMPUTER ASSISTED TOTAL KNEE ARTHROPLASTY (Right) - 160 as a surgical intervention.  The patient's history has been reviewed, patient examined, no change in status, stable for surgery.  I have reviewed the patient's chart and labs.  Questions were answered to the patient's satisfaction.    The risks, benefits, and alternatives were discussed with the patient. There are risks associated with the surgery including, but not limited to, problems with anesthesia (death), infection, instability (giving out of the joint), dislocation, differences in leg length/angulation/rotation, fracture of bones, loosening or failure of implants, hematoma (blood accumulation) which may require surgical drainage, blood clots, pulmonary embolism, nerve injury (foot drop and lateral thigh numbness), and blood vessel injury. The patient understands these risks and elects to proceed.    Jeremy Nicholson

## 2022-12-16 NOTE — Op Note (Signed)
OPERATIVE REPORT  SURGEON: Samson Frederic, MD   ASSISTANT: Clint Bolder, PA-C  PREOPERATIVE DIAGNOSIS: Primary Right knee arthritis.   POSTOPERATIVE DIAGNOSIS: Primary Right knee arthritis.   PROCEDURE: Computer assisted Right total knee arthroplasty.   IMPLANTS: Zimmer Persona PPS Cementless CR femur, size 10. Persona 0 degree Spiked Keel OsseoTi Tibia, size F. Vivacit-E polyethelyene insert, size 11 mm, CR. OsseoTi 3 peg patella, size 38 mm.  ANESTHESIA:  MAC, Regional, and Spinal  TOURNIQUET TIME: Not utilized.   ESTIMATED BLOOD LOSS:-150 mL    ANTIBIOTICS: 2g Ancef.  DRAINS: None.  COMPLICATIONS: None   CONDITION: PACU - hemodynamically stable.   BRIEF CLINICAL NOTE: Jeremy Nicholson is a 77 y.o. male with a long-standing history of Right knee arthritis. After failing conservative management, the patient was indicated for total knee arthroplasty. The risks, benefits, and alternatives to the procedure were explained, and the patient elected to proceed.  PROCEDURE IN DETAIL: Adductor canal block was obtained in the pre-op holding area. Once inside the operative room, spinal anesthesia was obtained, and a foley catheter was inserted. The patient was then positioned and the lower extremity was prepped and draped in the normal sterile surgical fashion.  A time-out was called verifying side and site of surgery. The patient received IV antibiotics within 60 minutes of beginning the procedure. A tourniquet was not utilized.   An anterior approach to the knee was performed utilizing a midvastus arthrotomy. A medial release was performed and the patellar fat pad was excised. Stryker imageless navigation was used to cut the distal femur perpendicular to the mechanical axis. A freehand patellar resection was performed, and the patella was sized an prepared with 3 lug holes.  Nagivation was used to make a neutral proximal tibia resection, taking 10 mm of bone from the less affected lateral  side with 3 degrees of slope. The menisci were excised. A spacer block was placed, and the alignment and balance in extension were confirmed.   The distal femur was sized using the 3-degree external rotation guide referencing the posterior femoral cortex. The appropriate 4-in-1 cutting block was pinned into place. Rotation was checked using Whiteside's line, the epicondylar axis, and then confirmed with a spacer block in flexion. The remaining femoral cuts were performed, taking care to protect the MCL.  The tibia was sized and the trial tray was pinned into place. The remaining trail components were inserted. The knee was stable to varus and valgus stress through a full range of motion. The patella tracked centrally, and the PCL was well balanced. The trial components were removed, and the proximal tibial surface was prepared. Final components were impacted into place. The knee was tested for a final time and found to be well balanced.   The wound was copiously irrigated with Prontosan solution and normal saline using pule lavage.  Marcaine solution was injected into the periarticular soft tissue.  The wound was closed in layers using #1 Vicryl and Stratafix for the fascia, 2-0 Vicryl for the subcutaneous fat, 2-0 Monocryl for the deep dermal layer, 3-0 running Monocryl subcuticular Stitch, and 4-0 Monocryl stay sutures at both ends of the wound. Dermabond was applied to the skin.  Once the glue was fully dried, an Aquacell Ag and compressive dressing were applied.  The patient was transported to the recovery room in stable condition.  Sponge, needle, and instrument counts were correct at the end of the case x2.  The patient tolerated the procedure well and there were no known complications.  The aquamantis was utilized for this case to help facilitate better hemostasis as patient was felt to be at increased risk of bleeding because of complex case requiring increased OR time and/or exposure - minimally  invasive approach.  A oscillating saw tip was utilized for this case to prevent damage to the soft tissue structures such as muscles, ligaments and tendons, and to ensure accurate bone cuts. This patient was at increased risk for above structures due to  minimally invasive approach.  Please note that a surgical assistant was a medical necessity for this procedure in order to perform it in a safe and expeditious manner. Surgical assistant was necessary to retract the ligaments and vital neurovascular structures to prevent injury to them and also necessary for proper positioning of the limb to allow for anatomic placement of the prosthesis.

## 2022-12-16 NOTE — Progress Notes (Signed)
Patient has known PAD s/p R external iliac stenting by vascular surgery. He has triphasic doppler signal, however pedal pulses are not palpable. He was seen in follow up by vascular sx on 4/26. I repeated his RLE ABI in preop holding today: 0.73 (0.60 at last vascular visit). Will be doing tourniquet free R TKA today.

## 2022-12-16 NOTE — Transfer of Care (Signed)
Immediate Anesthesia Transfer of Care Note  Patient: Trai Ells  Procedure(s) Performed: COMPUTER ASSISTED TOTAL KNEE ARTHROPLASTY (Right: Knee)  Patient Location: PACU  Anesthesia Type:Spinal  Level of Consciousness: awake, alert , oriented, and patient cooperative  Airway & Oxygen Therapy: Patient Spontanous Breathing and Patient connected to nasal cannula oxygen  Post-op Assessment: Report given to RN and Post -op Vital signs reviewed and stable  Post vital signs: Reviewed and stable  Last Vitals:  Vitals Value Taken Time  BP 123/57 12/16/22 1821  Temp    Pulse 84 12/16/22 1824  Resp 21 12/16/22 1824  SpO2 97 % 12/16/22 1824  Vitals shown include unfiled device data.  Last Pain:  Vitals:   12/16/22 1445  TempSrc:   PainSc: 0-No pain         Complications: No notable events documented.

## 2022-12-17 DIAGNOSIS — M1711 Unilateral primary osteoarthritis, right knee: Secondary | ICD-10-CM | POA: Diagnosis not present

## 2022-12-17 LAB — BASIC METABOLIC PANEL
Anion gap: 8 (ref 5–15)
BUN: 19 mg/dL (ref 8–23)
CO2: 20 mmol/L — ABNORMAL LOW (ref 22–32)
Calcium: 8.5 mg/dL — ABNORMAL LOW (ref 8.9–10.3)
Chloride: 107 mmol/L (ref 98–111)
Creatinine, Ser: 1.09 mg/dL (ref 0.61–1.24)
GFR, Estimated: >60 mL/min (ref >60)
Glucose, Bld: 198 mg/dL — ABNORMAL HIGH (ref 70–99)
Potassium: 4.5 mmol/L (ref 3.5–5.1)
Sodium: 135 mmol/L (ref 135–145)

## 2022-12-17 LAB — CBC
HCT: 36.5 % — ABNORMAL LOW (ref 39.0–52.0)
Hemoglobin: 11.7 g/dL — ABNORMAL LOW (ref 13.0–17.0)
MCH: 29.9 pg (ref 26.0–34.0)
MCHC: 32.1 g/dL (ref 30.0–36.0)
MCV: 93.4 fL (ref 80.0–100.0)
Platelets: 178 10*3/uL (ref 150–400)
RBC: 3.91 MIL/uL — ABNORMAL LOW (ref 4.22–5.81)
RDW: 14.2 % (ref 11.5–15.5)
WBC: 5.4 10*3/uL (ref 4.0–10.5)
nRBC: 0 % (ref 0.0–0.2)

## 2022-12-17 LAB — GLUCOSE, CAPILLARY
Glucose-Capillary: 125 mg/dL — ABNORMAL HIGH (ref 70–99)
Glucose-Capillary: 141 mg/dL — ABNORMAL HIGH (ref 70–99)

## 2022-12-17 MED ORDER — ONDANSETRON HCL 4 MG PO TABS: 4.0000 mg | ORAL_TABLET | Freq: Three times a day (TID) | ORAL | 0 refills | Status: AC | PRN

## 2022-12-17 MED ORDER — HYDROCODONE-ACETAMINOPHEN 5-325 MG PO TABS: 1.0000 | ORAL_TABLET | ORAL | 0 refills | Status: AC | PRN

## 2022-12-17 MED ORDER — SENNA 8.6 MG PO TABS: 2.0000 | ORAL_TABLET | Freq: Every day | ORAL | 0 refills | Status: AC

## 2022-12-17 MED ORDER — POLYETHYLENE GLYCOL 3350 17 G PO PACK: 17.0000 g | PACK | Freq: Every day | ORAL | 0 refills | Status: AC | PRN

## 2022-12-17 MED ORDER — DOCUSATE SODIUM 100 MG PO CAPS: 100.0000 mg | ORAL_CAPSULE | Freq: Two times a day (BID) | ORAL | 0 refills | Status: AC

## 2022-12-17 MED ORDER — ACETAMINOPHEN 500 MG PO TABS: 1000.0000 mg | ORAL_TABLET | Freq: Three times a day (TID) | ORAL | 0 refills | Status: AC | PRN

## 2022-12-17 MED ORDER — ASPIRIN 81 MG PO CHEW: 81.0000 mg | CHEWABLE_TABLET | Freq: Two times a day (BID) | ORAL | 0 refills | Status: AC

## 2022-12-17 NOTE — Progress Notes (Signed)
Physical Therapy Treatment Patient Details Name: Jeremy Nicholson MRN: [id number] DOB: Jan 12, 1946 Today's Date: 12/17/2022   History of Present Illness Pt s/p R TKR and with hx of DM, MI, CABG, and L TKR    PT Comments  Pt continues very cooperative and progressing well with mobility.  Pt up to ambulate in hall, negotiated stairs and reviewed written HEP.  Spouse present for full session.     Assistance Recommended at Discharge PRN  If plan is discharge home, recommend the following:  Can travel by private vehicle    A little help with walking and/or transfers;A little help with bathing/dressing/bathroom;Assist for transportation;Help with stairs or ramp for entrance;Assistance with cooking/housework      Equipment Recommendations  Rolling walker (2 wheels)    Recommendations for Other Services       Precautions / Restrictions Precautions Precautions: Knee;Fall Restrictions Weight Bearing Restrictions: No     Mobility  Bed Mobility Overal bed mobility: Needs Assistance Bed Mobility: Supine to Sit     Supine to sit: Min guard     General bed mobility comments: up in chair and returns to same    Transfers Overall transfer level: Needs assistance Equipment used: Rolling walker (2 wheels) Transfers: Sit to/from Stand Sit to Stand: Min guard, Supervision           General transfer comment: cues for LE management and use of UEs to self assist    Ambulation/Gait Ambulation/Gait assistance: Min guard, Supervision Gait Distance (Feet): 75 Feet Assistive device: Rolling walker (2 wheels) Gait Pattern/deviations: Step-to pattern, Decreased step length - right, Decreased step length - left, Shuffle, Trunk flexed Gait velocity: decr     General Gait Details: cues for sequence, posture and position from RW   Stairs Stairs: Yes Stairs assistance: Min assist Stair Management: No rails, Step to pattern, Backwards, Forwards, With walker Number of Stairs:  3 General stair comments: single step x 3 - 2x fwd and 1x bkwd; cues for sequence and foot/RW placement   Wheelchair Mobility     Tilt Bed    Modified Rankin (Stroke Patients Only)       Balance Overall balance assessment: Mild deficits observed, not formally tested                                          Cognition Arousal/Alertness: Awake/alert Behavior During Therapy: WFL for tasks assessed/performed Overall Cognitive Status: Within Functional Limits for tasks assessed                                          Exercises Total Joint Exercises Ankle Circles/Pumps: AROM, Both, 15 reps, Supine Quad Sets: AROM, Both, 10 reps, Supine Heel Slides: AAROM, Right, 15 reps, Supine Straight Leg Raises: AAROM, AROM, Right, 10 reps, Supine Long Arc Quad: AAROM, AROM, Right, 10 reps, Seated    General Comments        Pertinent Vitals/Pain Pain Assessment Pain Assessment: 0-10 Pain Score: 5  Pain Location: R knee Pain Descriptors / Indicators: Aching, Sore Pain Intervention(s): Limited activity within patient's tolerance, Monitored during session, Premedicated before session, Ice applied    Home Living Family/patient expects to be discharged to:: Private residence Living Arrangements: Spouse/significant other Available Help at Discharge: Family Type of Home: House Home Access: Stairs to enter  Entrance Stairs-Number of Steps: 1   Home Layout: Two level;Able to live on main level with bedroom/bathroom Home Equipment: Gilmer Mor - single point;Shower seat - built in      Prior Function            PT Goals (current goals can now be found in the care plan section) Acute Rehab PT Goals Patient Stated Goal: Regain IND PT Goal Formulation: With patient Time For Goal Achievement: 12/24/22 Potential to Achieve Goals: Good Progress towards PT goals: Progressing toward goals    Frequency    7X/week      PT Plan Current plan remains  appropriate    Co-evaluation              AM-PAC PT "6 Clicks" Mobility   Outcome Measure  Help needed turning from your back to your side while in a flat bed without using bedrails?: A Little Help needed moving from lying on your back to sitting on the side of a flat bed without using bedrails?: A Little Help needed moving to and from a bed to a chair (including a wheelchair)?: A Little Help needed standing up from a chair using your arms (e.g., wheelchair or bedside chair)?: A Little Help needed to walk in hospital room?: A Little Help needed climbing 3-5 steps with a railing? : A Little 6 Click Score: 18    End of Session Equipment Utilized During Treatment: Gait belt Activity Tolerance: Patient tolerated treatment well Patient left: in chair;with call bell/phone within reach;with chair alarm set;with family/visitor present Nurse Communication: Mobility status PT Visit Diagnosis: Unsteadiness on feet (R26.81);Difficulty in walking, not elsewhere classified (R26.2);Pain Pain - Right/Left: Right Pain - part of body: Knee     Time: 4098-1191 PT Time Calculation (min) (ACUTE ONLY): 26 min  Charges:    $Gait Training: 8-22 mins $Therapeutic Exercise: 8-22 mins $Therapeutic Activity: 8-22 mins PT General Charges $$ ACUTE PT VISIT: 1 Visit                     Mauro Kaufmann PT Acute Rehabilitation Services Pager [phone] Office [phone]    Lakyn Mantione 12/17/2022, 2:55 PM

## 2022-12-17 NOTE — Discharge Summary (Signed)
Physician Discharge Summary  Patient ID: Jeremy Nicholson MRN: [id number] DOB/AGE: 1946/02/14 77 y.o.  Admit date: 12/16/2022 Discharge date: 12/17/2022  Admission Diagnoses:  Osteoarthritis of right knee  Discharge Diagnoses:  Principal Problem:   Osteoarthritis of right knee Active Problems:   S/P total knee arthroplasty, right   Past Medical History:  Diagnosis Date   Aortic valve stenosis, moderate    per echo 06-02-2017 (in epic)  ef 55-60%,  possible bicuspid AV,  severeely thickened and calcified with severely restricted leaflet opening,  valve area 1.17cm^2, mean gradient , peak gradient ,  moderate AR   Arthritis    oa   BPH associated with nocturia    Cancer (HCC)    prostate cancer   Complication of anesthesia    due to heart murmur  mod to severe aortic stenosis   Coronary artery disease    Full dentures    Glaucoma, both eyes    Heart murmur, systolic    History of kidney stones    Hypertension    Left ureteral stone    Myocardial infarction (HCC)    mild in 11-2017 at [facility] F/U from [place] on chart   Peripheral vascular disease (HCC)    SIRS (systemic inflammatory response syndrome) (HCC) 05/31/2017   hospital admission--  unknown etiology   Type 2 diabetes mellitus (HCC)    Wears glasses     Surgeries: Procedure(s): COMPUTER ASSISTED TOTAL KNEE ARTHROPLASTY on 12/16/2022   Consultants (if any):   Discharged Condition: Improved  Hospital Course: Jeremy Nicholson is an 77 y.o. male who was admitted 12/16/2022 with a diagnosis of Osteoarthritis of right knee and went to the operating room on 12/16/2022 and underwent the above named procedures.    He was given perioperative antibiotics:  Anti-infectives (From admission, onward)    Start     Dose/Rate Route Frequency Ordered Stop   12/17/22 0600  ceFAZolin (ANCEF) IVPB 2g/100 mL premix        2 g 200 mL/hr over 30 Minutes Intravenous On call to O.R. 12/16/22 1206 12/16/22 1545   12/16/22  2200  ceFAZolin (ANCEF) IVPB 2g/100 mL premix        2 g 200 mL/hr over 30 Minutes Intravenous Every 6 hours 12/16/22 1957 12/17/22 0418       He was given sequential compression devices, early ambulation, and aspirin for DVT prophylaxis.  POD#1 He ambulated well with PT and was discharged home with OPPT scheduled.   He benefited maximally from the hospital stay and there were no complications.    Recent vital signs:  Vitals:   12/17/22 0204 12/17/22 0534  BP: (!) 131/55 114/87  Pulse: 66 (!) 56  Resp: 16 16  Temp: (!) 97.5 F (36.4 C) 97.7 F (36.5 C)  SpO2: 99% 98%    Recent laboratory studies:  Lab Results  Component Value Date   HGB 11.7 (L) 12/17/2022   HGB 13.7 12/11/2022   HGB 13.5 06/15/2022   Lab Results  Component Value Date   WBC 5.4 12/17/2022   PLT 178 12/17/2022   Lab Results  Component Value Date   INR 1.0 09/29/2021   Lab Results  Component Value Date   NA 135 12/17/2022   K 4.5 12/17/2022   CL 107 12/17/2022   CO2 20 (L) 12/17/2022   BUN 19 12/17/2022   CREATININE 1.09 12/17/2022   GLUCOSE 198 (H) 12/17/2022     Allergies as of 12/17/2022       Reactions  Atorvastatin Other (See Comments)   Muscle pain   Other    BLOOD PRO{DUCT REFUSAL   Pravastatin Other (See Comments)   Muscle pain   Latex Hives   Rosuvastatin Other (See Comments)   Muscle pain   Tape Hives   Latex tape        Medication List     TAKE these medications    acetaminophen 500 MG tablet Commonly known as: TYLENOL Take 2 tablets (1,000 mg total) by mouth every 8 (eight) hours as needed for moderate pain or mild pain. What changed:  when to take this reasons to take this   aspirin 81 MG chewable tablet Commonly known as: Aspirin Childrens Chew 1 tablet (81 mg total) by mouth 2 (two) times daily with a meal. What changed: when to take this   atorvastatin 80 MG tablet Commonly known as: LIPITOR Take 80 mg by mouth at bedtime.    Brinzolamide-Brimonidine 1-0.2 % Susp Place 1 drop into both eyes 2 (two) times daily.   carvedilol 25 MG tablet Commonly known as: COREG Take 12.5 mg by mouth 2 (two) times daily with a meal.   cetirizine 10 MG tablet Commonly known as: ZYRTEC Take 10 mg by mouth daily.   chlorthalidone 25 MG tablet Commonly known as: HYGROTON Take 12.5 mg by mouth daily.   cholecalciferol 25 MCG (1000 UNIT) tablet Commonly known as: VITAMIN D3 Take 1,000 Units by mouth daily.   docusate sodium 100 MG capsule Commonly known as: Colace Take 1 capsule (100 mg total) by mouth 2 (two) times daily.   ezetimibe 10 MG tablet Commonly known as: ZETIA Take 10 mg by mouth daily.   finasteride 5 MG tablet Commonly known as: PROSCAR Take 5 mg by mouth every morning.   guaiFENesin 600 MG 12 hr tablet Commonly known as: MUCINEX Take 600 mg by mouth 2 (two) times daily.   HYDROcodone-acetaminophen 5-325 MG tablet Commonly known as: NORCO/VICODIN Take 1 tablet by mouth every 4 (four) hours as needed for up to 7 days for moderate pain or severe pain.   latanoprost 0.005 % ophthalmic solution Commonly known as: XALATAN Place 1 drop into both eyes at bedtime.   lisinopril 5 MG tablet Commonly known as: ZESTRIL Take 5 mg by mouth daily.   metFORMIN 500 MG 24 hr tablet Commonly known as: GLUCOPHAGE-XR Take 1,000 mg by mouth 2 (two) times daily.   omeprazole 20 MG capsule Commonly known as: PRILOSEC Take 20 mg by mouth daily as needed (acid reflux).   ondansetron 4 MG tablet Commonly known as: Zofran Take 1 tablet (4 mg total) by mouth every 8 (eight) hours as needed for nausea or vomiting.   Ozempic (1 MG/DOSE) 2 MG/1.5ML Sopn Generic drug: Semaglutide (1 MG/DOSE) Inject 1 mg into the skin every Monday.   PAIN RELIEF ROLL-ON EX Apply 1 Application topically daily as needed (pain).   polyethylene glycol 17 g packet Commonly known as: MiraLax Take 17 g by mouth daily as needed for mild  constipation or moderate constipation.   senna 8.6 MG Tabs tablet Commonly known as: SENOKOT Take 2 tablets (17.2 mg total) by mouth at bedtime for 15 days.   tamsulosin 0.4 MG Caps capsule Commonly known as: FLOMAX Take 0.4 mg by mouth at bedtime.               Discharge Care Instructions  (From admission, onward)           Start     Ordered  12/17/22 0000  Weight bearing as tolerated        12/17/22 0705   12/17/22 0000  Change dressing       Comments: Do not remove your dressing.   12/17/22 0705              WEIGHT BEARING   Weight bearing as tolerated with assist device (walker, cane, etc) as directed, use it as long as suggested by your surgeon or therapist, typically at least 4-6 weeks.   EXERCISES  Results after joint replacement surgery are often greatly improved when you follow the exercise, range of motion and muscle strengthening exercises prescribed by your doctor. Safety measures are also important to protect the joint from further injury. Any time any of these exercises cause you to have increased pain or swelling, decrease what you are doing until you are comfortable again and then slowly increase them. If you have problems or questions, call your caregiver or physical therapist for advice.   Rehabilitation is important following a joint replacement. After just a few days of immobilization, the muscles of the leg can become weakened and shrink (atrophy).  These exercises are designed to build up the tone and strength of the thigh and leg muscles and to improve motion. Often times heat used for twenty to thirty minutes before working out will loosen up your tissues and help with improving the range of motion but do not use heat for the first two weeks following surgery (sometimes heat can increase post-operative swelling).   These exercises can be done on a training (exercise) mat, on the floor, on a table or on a bed. Use whatever works the best and  is most comfortable for you.    Use music or television while you are exercising so that the exercises are a pleasant break in your day. This will make your life better with the exercises acting as a break in your routine that you can look forward to.   Perform all exercises about fifteen times, three times per day or as directed.  You should exercise both the operative leg and the other leg as well.  Exercises include:   Quad Sets - Tighten up the muscle on the front of the thigh (Quad) and hold for 5-10 seconds.   Straight Leg Raises - With your knee straight (if you were given a brace, keep it on), lift the leg to 60 degrees, hold for 3 seconds, and slowly lower the leg.  Perform this exercise against resistance later as your leg gets stronger.  Leg Slides: Lying on your back, slowly slide your foot toward your buttocks, bending your knee up off the floor (only go as far as is comfortable). Then slowly slide your foot back down until your leg is flat on the floor again.  Angel Wings: Lying on your back spread your legs to the side as far apart as you can without causing discomfort.  Hamstring Strength:  Lying on your back, push your heel against the floor with your leg straight by tightening up the muscles of your buttocks.  Repeat, but this time bend your knee to a comfortable angle, and push your heel against the floor.  You may put a pillow under the heel to make it more comfortable if necessary.   A rehabilitation program following joint replacement surgery can speed recovery and prevent re-injury in the future due to weakened muscles. Contact your doctor or a physical therapist for more information on knee rehabilitation.  CONSTIPATION  Constipation is defined medically as fewer than three stools per week and severe constipation as less than one stool per week.  Even if you have a regular bowel pattern at home, your normal regimen is likely to be disrupted due to multiple reasons following  surgery.  Combination of anesthesia, postoperative narcotics, change in appetite and fluid intake all can affect your bowels.   YOU MUST use at least one of the following options; they are listed in order of increasing strength to get the job done.  They are all available over the counter, and you may need to use some, POSSIBLY even all of these options:    Drink plenty of fluids (prune juice may be helpful) and high fiber foods Colace 100 mg by mouth twice a day  Senokot for constipation as directed and as needed Dulcolax (bisacodyl), take with full glass of water  Miralax (polyethylene glycol) once or twice a day as needed.  If you have tried all these things and are unable to have a bowel movement in the first 3-4 days after surgery call either your surgeon or your primary doctor.    If you experience loose stools or diarrhea, hold the medications until you stool forms back up.  If your symptoms do not get better within 1 week or if they get worse, check with your doctor.  If you experience "the worst abdominal pain ever" or develop nausea or vomiting, please contact the office immediately for further recommendations for treatment.   ITCHING:  If you experience itching with your medications, try taking only a single pain pill, or even half a pain pill at a time.  You can also use Benadryl over the counter for itching or also to help with sleep.   TED HOSE STOCKINGS:  Use stockings on both legs until for at least 2 weeks or as directed by physician office. They may be removed at night for sleeping.  MEDICATIONS:  See your medication summary on the "After Visit Summary" that nursing will review with you.  You may have some home medications which will be placed on hold until you complete the course of blood thinner medication.  It is important for you to complete the blood thinner medication as prescribed.  PRECAUTIONS:  If you experience chest pain or shortness of breath - call 911 immediately  for transfer to the hospital emergency department.   If you develop a fever greater that 101 F, purulent drainage from wound, increased redness or drainage from wound, foul odor from the wound/dressing, or calf pain - CONTACT YOUR SURGEON.                                                   FOLLOW-UP APPOINTMENTS:  If you do not already have a post-op appointment, please call the office for an appointment to be seen by your surgeon.  Guidelines for how soon to be seen are listed in your "After Visit Summary", but are typically between 1-4 weeks after surgery.  OTHER INSTRUCTIONS:   Knee Replacement:  Do not place pillow under knee, focus on keeping the knee straight while resting. CPM instructions: 0-90 degrees, 2 hours in the morning, 2 hours in the afternoon, and 2 hours in the evening. Place foam block, curve side up under heel at all times except when in CPM or  when walking.  DO NOT modify, tear, cut, or change the foam block in any way.   MAKE SURE YOU:  Understand these instructions.  Get help right away if you are not doing well or get worse.    Thank you for letting us be a part of your medical care team.  It is a privilege we respect greatly.  We hope these instructions will help you stay on track for a fast and full recovery!   Diagnostic Studies: DG Knee Right Port  Result Date: 12/16/2022 CLINICAL DATA:  Status post right knee arthroplasty EXAM: PORTABLE RIGHT KNEE - 1-2 VIEW COMPARISON:  05/30/2017 FINDINGS: There is recent right knee arthroplasty. No fracture or dislocation is seen. There are pockets of air in soft tissues. Skin staples are noted anteriorly. Arterial calcifications are seen. IMPRESSION: Status post right knee arthroplasty. Arteriosclerosis. Electronically Signed   By: Ernie Avena M.D.   On: 12/16/2022 18:36    Disposition: Discharge disposition: 01-Home or Self Care       Discharge Instructions     Call MD / Call 911   Complete by: As directed     If you experience chest pain or shortness of breath, CALL 911 and be transported to the hospital emergency room.  If you develope a fever above 101 F, pus (white drainage) or increased drainage or redness at the wound, or calf pain, call your surgeon's office.   Change dressing   Complete by: As directed    Do not remove your dressing.   Constipation Prevention   Complete by: As directed    Drink plenty of fluids.  Prune juice may be helpful.  You may use a stool softener, such as Colace (over the counter) 100 mg twice a day.  Use MiraLax (over the counter) for constipation as needed.   Diet Carb Modified   Complete by: As directed    Discharge instructions   Complete by: As directed    Elevate toes above nose. Use cryotherapy as needed for pain and swelling.   Do not put a pillow under the knee. Place it under the heel.   Complete by: As directed    Driving restrictions   Complete by: As directed    No driving for 6 weeks   Increase activity slowly as tolerated   Complete by: As directed    Lifting restrictions   Complete by: As directed    No lifting for 6 weeks   Post-operative opioid taper instructions:   Complete by: As directed    POST-OPERATIVE OPIOID TAPER INSTRUCTIONS: It is important to wean off of your opioid medication as soon as possible. If you do not need pain medication after your surgery it is ok to stop day one. Opioids include: Codeine, Hydrocodone(Norco, Vicodin), Oxycodone(Percocet, oxycontin) and hydromorphone amongst others.  Long term and even short term use of opiods can cause: Increased pain response Dependence Constipation Depression Respiratory depression And more.  Withdrawal symptoms can include Flu like symptoms Nausea, vomiting And more Techniques to manage these symptoms Hydrate well Eat regular healthy meals Stay active Use relaxation techniques(deep breathing, meditating, yoga) Do Not substitute Alcohol to help with tapering If you have  been on opioids for less than two weeks and do not have pain than it is ok to stop all together.  Plan to wean off of opioids This plan should start within one week post op of your joint replacement. Maintain the same interval or time between taking each dose and  first decrease the dose.  Cut the total daily intake of opioids by one tablet each day Next start to increase the time between doses. The last dose that should be eliminated is the evening dose.      TED hose   Complete by: As directed    Use stockings (TED hose) for 2 weeks on both leg(s).  You may remove them at night for sleeping.   Weight bearing as tolerated   Complete by: As directed         Follow-up Information     Clois Dupes, PA-C. Schedule an appointment as soon as possible for a visit in 2 week(s).   Specialty: Orthopedic Surgery Why: For suture removal, For wound re-check Contact information: [address], [address] [place] [address] [phone]                  Signed: Clois Dupes 12/17/2022, 7:11 AM

## 2022-12-17 NOTE — Plan of Care (Signed)
Pt ready to DC home with family. 

## 2022-12-17 NOTE — TOC Transition Note (Signed)
Transition of Care [facility]) - CM/SW Discharge Note  Patient Details  Name: Jeremy Nicholson MRN: [id number] Date of Birth: August 28, 1945  [facility] CM/SW Contact:  Ewing Schlein, LCSW Phone Number: 12/17/2022, 9:55 AM  Clinical Narrative: Patient is expected to discharge home after working with PT. CSW met with patient to confirm discharge plan. Patient will go home with OPPT at [facility] with the first appointment scheduled for 12/21/22. Patient has a rolling walker at home, so there are no DME needs at this time. TOC signing off.    Final next level of care: OP Rehab Barriers to Discharge: No Barriers Identified  Patient Goals and CMS Choice Choice offered to / list presented to : NA  Discharge Plan and Services Additional resources added to the After Visit Summary for         DME Arranged: N/A DME Agency: NA  Social Determinants of Health (SDOH) Interventions SDOH Screenings   Food Insecurity: No Food Insecurity (12/16/2022)  Housing: Low Risk  (12/16/2022)  Transportation Needs: No Transportation Needs (12/16/2022)  Utilities: Not At Risk (12/16/2022)  Depression (PHQ2-9): Low Risk  (08/10/2019)  Tobacco Use: Medium Risk (12/16/2022)   Readmission Risk Interventions     No data to display

## 2022-12-17 NOTE — Progress Notes (Signed)
    Subjective:  Patient reports pain as mild to none this morning.  Denies N/V/CP/SOB/Abd pain. He states he is doing very well. He denies any tingling or numbness in LE bilaterally.   Objective:   VITALS:   Vitals:   12/16/22 1956 12/16/22 2154 12/17/22 0204 12/17/22 0534  BP: (!) 150/70 (!) 146/71 (!) 131/55 114/87  Pulse: 67 64 66 (!) 56  Resp: 16 16 16 16   Temp: 97.7 F (36.5 C) 97.7 F (36.5 C) (!) 97.5 F (36.4 C) 97.7 F (36.5 C)  TempSrc: Oral Oral Oral Oral  SpO2: 100% 100% 99% 98%  Weight:      Height:        Patient sitting up in bed. NAD.  Neurologically intact ABD soft Neurovascular intact Sensation intact distally Dorsiflexion/Plantar flexion intact Incision: dressing C/D/I No cellulitis present Compartment soft Barely palpable pedal pulses, same as baseline as he has PAD, confirmed with doppler.  Ace bandage removed and TED stockings placed.   Lab Results  Component Value Date   WBC 5.4 12/17/2022   HGB 11.7 (L) 12/17/2022   HCT 36.5 (L) 12/17/2022   MCV 93.4 12/17/2022   PLT 178 12/17/2022   BMET    Component Value Date/Time   NA 135 12/17/2022 0330   K 4.5 12/17/2022 0330   CL 107 12/17/2022 0330   CO2 20 (L) 12/17/2022 0330   GLUCOSE 198 (H) 12/17/2022 0330   BUN 19 12/17/2022 0330   CREATININE 1.09 12/17/2022 0330   CALCIUM 8.5 (L) 12/17/2022 0330   GFRNONAA >60 12/17/2022 0330     Assessment/Plan: 1 Day Post-Op   Principal Problem:   Osteoarthritis of right knee Active Problems:   S/P total knee arthroplasty, right  ABLA. Hemoglobin 11.7. Stable. [employer].   WBAT with walker DVT ppx: Aspirin, SCDs, TEDS PO pain control PT/OT: PT has not seen yet. PT to come by today.  Dispo: D/c home with OPPT once voided and cleared with PT. [facility].     Clois Dupes, PA-C 12/17/2022, 6:59 AM   [facility]  [place] [address], [place], [place]  [address] Phone: [phone] [email or web address] Facebook  [employer]

## 2022-12-17 NOTE — Evaluation (Signed)
Physical Therapy Evaluation Patient Details Name: Jeremy Nicholson MRN: [id number] DOB: 02/13/1946 Today's Date: 12/17/2022  History of Present Illness  Pt s/p R TKR and with hx of DM, MI, CABG, and L TKR  Clinical Impression  Pt s/p R TKR and presents with decreased R LE strength/ROM and post op pain limiting functional mobility.  Pt should progress to dc home with family assist.      Assistance Recommended at Discharge PRN  If plan is discharge home, recommend the following:  Can travel by private vehicle  A little help with walking and/or transfers;A little help with bathing/dressing/bathroom;Assist for transportation;Help with stairs or ramp for entrance;Assistance with cooking/housework        Equipment Recommendations Rolling walker (2 wheels)  Recommendations for Other Services       Functional Status Assessment Patient has had a recent decline in their functional status and demonstrates the ability to make significant improvements in function in a reasonable and predictable amount of time.     Precautions / Restrictions Precautions Precautions: Knee;Fall Restrictions Weight Bearing Restrictions: No      Mobility  Bed Mobility Overal bed mobility: Needs Assistance Bed Mobility: Supine to Sit     Supine to sit: Min guard     General bed mobility comments: cues for sequence an use of L LE to self assist    Transfers Overall transfer level: Needs assistance Equipment used: Rolling walker (2 wheels) Transfers: Sit to/from Stand Sit to Stand: Min assist, Min guard           General transfer comment: cues for LE management and use of UEs to self assist    Ambulation/Gait Ambulation/Gait assistance: Min assist, Min guard Gait Distance (Feet): 95 Feet Assistive device: Rolling walker (2 wheels) Gait Pattern/deviations: Step-to pattern, Decreased step length - right, Decreased step length - left, Shuffle, Trunk flexed Gait velocity: decr     General  Gait Details: cues for sequence, posture and position from [employer]            Wheelchair Mobility     Tilt Bed    Modified Rankin (Stroke Patients Only)       Balance Overall balance assessment: Mild deficits observed, not formally tested                                           Pertinent Vitals/Pain Pain Assessment Pain Assessment: 0-10 Pain Score: 5  Pain Location: R knee Pain Descriptors / Indicators: Aching, Sore Pain Intervention(s): Limited activity within patient's tolerance, Monitored during session, Premedicated before session, Ice applied    Home Living Family/patient expects to be discharged to:: Private residence Living Arrangements: Spouse/significant other Available Help at Discharge: Family Type of Home: House Home Access: Stairs to enter   Secretary/administrator of Steps: 1   Home Layout: Two level;Able to live on main level with bedroom/bathroom Home Equipment: Gilmer Mor - single point;Shower seat - built in      Prior Function Prior Level of Function : Independent/Modified Independent                     Higher education careers adviser        Extremity/Trunk Assessment   Upper Extremity Assessment Upper Extremity Assessment: Overall WFL for tasks assessed    Lower Extremity Assessment Lower Extremity Assessment: LLE deficits/detail LLE Deficits / Details: IND SLR and AAROM at  knee -5 - 75    Cervical / Trunk Assessment Cervical / Trunk Assessment: Normal  Communication   Communication: No difficulties  Cognition Arousal/Alertness: Awake/alert Behavior During Therapy: WFL for tasks assessed/performed Overall Cognitive Status: Within Functional Limits for tasks assessed                                          General Comments      Exercises Total Joint Exercises Ankle Circles/Pumps: AROM, Both, 15 reps, Supine Quad Sets: AROM, Both, 10 reps, Supine Heel Slides: AAROM, Right, 15 reps,  Supine Straight Leg Raises: AAROM, AROM, Right, 10 reps, Supine Long Arc Quad: AAROM, AROM, Right, 10 reps, Seated   Assessment/Plan    PT Assessment Patient needs continued PT services  PT Problem List Decreased strength;Decreased range of motion;Decreased activity tolerance;Decreased balance;Decreased mobility;Decreased knowledge of use of DME;Pain       PT Treatment Interventions DME instruction;Gait training;Stair training;Functional mobility training;Therapeutic activities;Therapeutic exercise;Patient/family education    PT Goals (Current goals can be found in the Care Plan section)  Acute Rehab PT Goals Patient Stated Goal: Regain IND PT Goal Formulation: With patient Time For Goal Achievement: 12/24/22 Potential to Achieve Goals: Good    Frequency 7X/week     Co-evaluation               AM-PAC PT "6 Clicks" Mobility  Outcome Measure Help needed turning from your back to your side while in a flat bed without using bedrails?: A Little Help needed moving from lying on your back to sitting on the side of a flat bed without using bedrails?: A Little Help needed moving to and from a bed to a chair (including a wheelchair)?: A Little Help needed standing up from a chair using your arms (e.g., wheelchair or bedside chair)?: A Little Help needed to walk in hospital room?: A Little Help needed climbing 3-5 steps with a railing? : A Lot 6 Click Score: 17    End of Session Equipment Utilized During Treatment: Gait belt Activity Tolerance: Patient tolerated treatment well Patient left: in chair;with call bell/phone within reach;with chair alarm set;with family/visitor present Nurse Communication: Mobility status PT Visit Diagnosis: Unsteadiness on feet (R26.81);Difficulty in walking, not elsewhere classified (R26.2);Pain Pain - Right/Left: Right Pain - part of body: Knee    Time: 0822-0910 PT Time Calculation (min) (ACUTE ONLY): 48 min   Charges:   PT Evaluation $PT  Eval Low Complexity: 1 Low PT Treatments $Gait Training: 8-22 mins $Therapeutic Exercise: 8-22 mins PT General Charges $$ ACUTE PT VISIT: 1 Visit         Mauro Kaufmann PT Acute Rehabilitation Services Pager [phone] Office [phone]   Dory Verdun 12/17/2022, 11:57 AM

## 2022-12-21 ENCOUNTER — Encounter (HOSPITAL_COMMUNITY): Payer: Self-pay | Admitting: Orthopedic Surgery

## 2023-01-26 ENCOUNTER — Ambulatory Visit: Payer: Self-pay | Admitting: Student

## 2023-01-27 NOTE — Patient Instructions (Signed)
DUE TO COVID-19 ONLY TWO VISITORS  (aged 76 and older)  ARE ALLOWED TO COME WITH YOU AND STAY IN THE WAITING ROOM ONLY DURING PRE OP AND PROCEDURE.   **NO VISITORS ARE ALLOWED IN THE SHORT STAY AREA OR RECOVERY ROOM!!**  IF YOU WILL BE ADMITTED INTO THE HOSPITAL YOU ARE ALLOWED ONLY FOUR SUPPORT PEOPLE DURING VISITATION HOURS ONLY (7 AM -8PM)   The support person(s) must pass our screening, gel in and out, and wear a mask at all times, including in the patient's room. Patients must also wear a mask when staff or their support person are in the room. Visitors GUEST BADGE MUST BE WORN VISIBLY  One adult visitor may remain with you overnight and MUST be in the room by 8 P.M.     Your procedure is scheduled on: 02/04/23   Report to [facility] Main Entrance    Report to admitting at : 2:15 PM   Call this number if you have problems the morning of surgery [phone]   Do not eat food :After Midnight.   After Midnight you may have the following liquids until: 1:30 PM DAY OF SURGERY  Water Black Coffee (sugar ok, NO MILK/CREAM OR CREAMERS)  Tea (sugar ok, NO MILK/CREAM OR CREAMERS) regular and decaf                             Plain Jell-O (NO RED)                                           Fruit ices (not with fruit pulp, NO RED)                                     Popsicles (NO RED)                                                                  Juice: apple, WHITE grape, WHITE cranberry Sports drinks like Gatorade (NO RED)   The day of surgery:  Drink ONE (1) Pre-Surgery Clear G2 at : 1:30 PM the morning of surgery. Drink in one sitting. Do not sip.  This drink was given to you during your hospital  pre-op appointment visit. Nothing else to drink after completing the  Pre-Surgery Clear Ensure or G2.          If you have questions, please contact your surgeon's office.  FOLLOW ANY ADDITIONAL PRE OP INSTRUCTIONS YOU RECEIVED FROM YOUR SURGEON'S OFFICE!!!   Oral Hygiene is  also important to reduce your risk of infection.                                    Remember - BRUSH YOUR TEETH THE MORNING OF SURGERY WITH YOUR REGULAR TOOTHPASTE  DENTURES WILL BE REMOVED PRIOR TO SURGERY PLEASE DO NOT APPLY "Poly grip" OR ADHESIVES!!!   Do NOT smoke after Midnight   Take these medicines the morning of surgery with  A SIP OF WATER: carvedilol,finasteride.Use eye drops as usual. Tylenol,cetirizine,omeprazole as needed. How to Manage Your Diabetes Before and After Surgery  Why is it important to control my blood sugar before and after surgery? Improving blood sugar levels before and after surgery helps healing and can limit problems. A way of improving blood sugar control is eating a healthy diet by:  Eating less sugar and carbohydrates  Increasing activity/exercise  Talking with your doctor about reaching your blood sugar goals High blood sugars (greater than 180 mg/dL) can raise your risk of infections and slow your recovery, so you will need to focus on controlling your diabetes during the weeks before surgery. Make sure that the doctor who takes care of your diabetes knows about your planned surgery including the date and location.  How do I manage my blood sugar before surgery? Check your blood sugar at least 4 times a day, starting 2 days before surgery, to make sure that the level is not too high or low. Check your blood sugar the morning of your surgery when you wake up and every 2 hours until you get to the Short Stay unit. If your blood sugar is less than 70 mg/dL, you will need to treat for low blood sugar: Do not take insulin. Treat a low blood sugar (less than 70 mg/dL) with  cup of clear juice (cranberry or apple), 4 glucose tablets, OR glucose gel. Recheck blood sugar in 15 minutes after treatment (to make sure it is greater than 70 mg/dL). If your blood sugar is not greater than 70 mg/dL on recheck, call [phone] for further instructions. Report your  blood sugar to the short stay nurse when you get to Short Stay.  If you are admitted to the hospital after surgery: Your blood sugar will be checked by the staff and you will probably be given insulin after surgery (instead of oral diabetes medicines) to make sure you have good blood sugar levels. The goal for blood sugar control after surgery is 80-180 mg/dL.   WHAT DO I DO ABOUT MY DIABETES MEDICATION?      THE MORNING OF SURGERY,DO NOT TAKE ANY ORAL DIABETIC MEDICATIONS DAY OF YOUR SURGERY  DO NOT TAKE THE FOLLOWING 7 DAYS PRIOR TO SURGERY: Ozempic, Wegovy, Rybelsus (Semaglutide), Byetta (exenatide), Bydureon (exenatide ER), Victoza, Saxenda (liraglutide), or Trulicity (dulaglutide) Mounjaro (Tirzepatide) Adlyxin (Lixisenatide), Polyethylene Glycol Loxenatide. DO NOT TAKE SEMAGLUTIDE AFTER: 01/27/23                              You may not have any metal on your body including hair pins, jewelry, and body piercing            Do not wear lotions, powders, perfumes/cologne, or deodorant              Men may shave face and neck.   Do not bring valuables to the hospital. [facility] IS NOT             RESPONSIBLE   FOR VALUABLES.   Contacts, glasses, or bridgework may not be worn into surgery.   Bring small overnight bag day of surgery.   DO NOT BRING YOUR HOME MEDICATIONS TO THE HOSPITAL. PHARMACY WILL DISPENSE MEDICATIONS LISTED ON YOUR MEDICATION LIST TO YOU DURING YOUR ADMISSION IN THE HOSPITAL!    Patients discharged on the day of surgery will not be allowed to drive home.  Someone NEEDS to stay with you  for the first 24 hours after anesthesia.   Special Instructions: Bring a copy of your healthcare power of attorney and living will documents         the day of surgery if you haven't scanned them before.              Please read over the following fact sheets you were given: IF YOU HAVE QUESTIONS ABOUT YOUR PRE-OP INSTRUCTIONS PLEASE CALL [phone]    [facility] - Preparing  for Surgery Before surgery, you can play an important role.  Because skin is not sterile, your skin needs to be as free of germs as possible.  You can reduce the number of germs on your skin by washing with CHG (chlorahexidine gluconate) soap before surgery.  CHG is an antiseptic cleaner which kills germs and bonds with the skin to continue killing germs even after washing. Please DO NOT use if you have an allergy to CHG or antibacterial soaps.  If your skin becomes reddened/irritated stop using the CHG and inform your nurse when you arrive at Short Stay. Do not shave (including legs and underarms) for at least 48 hours prior to the first CHG shower.  You may shave your face/neck. Please follow these instructions carefully:  1.  Shower with CHG Soap the night before surgery and the  morning of Surgery.  2.  If you choose to wash your hair, wash your hair first as usual with your  normal  shampoo.  3.  After you shampoo, rinse your hair and body thoroughly to remove the  shampoo.                           4.  Use CHG as you would any other liquid soap.  You can apply chg directly  to the skin and wash                       Gently with a scrungie or clean washcloth.  5.  Apply the CHG Soap to your body ONLY FROM THE NECK DOWN.   Do not use on face/ open                           Wound or open sores. Avoid contact with eyes, ears mouth and genitals (private parts).                       Wash face,  Genitals (private parts) with your normal soap.             6.  Wash thoroughly, paying special attention to the area where your surgery  will be performed.  7.  Thoroughly rinse your body with warm water from the neck down.  8.  DO NOT shower/wash with your normal soap after using and rinsing off  the CHG Soap.                9.  Pat yourself dry with a clean towel.            10.  Wear clean pajamas.            11.  Place clean sheets on your bed the night of your first shower and do not  sleep with  pets. Day of Surgery : Do not apply any lotions/deodorants the morning of surgery.  Please wear clean clothes to the hospital/surgery  center.  FAILURE TO FOLLOW THESE INSTRUCTIONS MAY RESULT IN THE CANCELLATION OF YOUR SURGERY PATIENT SIGNATURE_________________________________  NURSE SIGNATURE__________________________________  ________________________________________________________________________  Rogelia Mire  An incentive spirometer is a tool that can help keep your lungs clear and active. This tool measures how well you are filling your lungs with each breath. Taking long deep breaths may help reverse or decrease the chance of developing breathing (pulmonary) problems (especially infection) following: A long period of time when you are unable to move or be active. BEFORE THE PROCEDURE  If the spirometer includes an indicator to show your best effort, your nurse or respiratory therapist will set it to a desired goal. If possible, sit up straight or lean slightly forward. Try not to slouch. Hold the incentive spirometer in an upright position. INSTRUCTIONS FOR USE  Sit on the edge of your bed if possible, or sit up as far as you can in bed or on a chair. Hold the incentive spirometer in an upright position. Breathe out normally. Place the mouthpiece in your mouth and seal your lips tightly around it. Breathe in slowly and as deeply as possible, raising the piston or the ball toward the top of the column. Hold your breath for 3-5 seconds or for as long as possible. Allow the piston or ball to fall to the bottom of the column. Remove the mouthpiece from your mouth and breathe out normally. Rest for a few seconds and repeat Steps 1 through 7 at least 10 times every 1-2 hours when you are awake. Take your time and take a few normal breaths between deep breaths. The spirometer may include an indicator to show your best effort. Use the indicator as a goal to work toward during each  repetition. After each set of 10 deep breaths, practice coughing to be sure your lungs are clear. If you have an incision (the cut made at the time of surgery), support your incision when coughing by placing a pillow or rolled up towels firmly against it. Once you are able to get out of bed, walk around indoors and cough well. You may stop using the incentive spirometer when instructed by your caregiver.  RISKS AND COMPLICATIONS Take your time so you do not get dizzy or light-headed. If you are in pain, you may need to take or ask for pain medication before doing incentive spirometry. It is harder to take a deep breath if you are having pain. AFTER USE Rest and breathe slowly and easily. It can be helpful to keep track of a log of your progress. Your caregiver can provide you with a simple table to help with this. If you are using the spirometer at home, follow these instructions: SEEK MEDICAL CARE IF:  You are having difficultly using the spirometer. You have trouble using the spirometer as often as instructed. Your pain medication is not giving enough relief while using the spirometer. You develop fever of 100.5 F (38.1 C) or higher. SEEK IMMEDIATE MEDICAL CARE IF:  You cough up bloody sputum that had not been present before. You develop fever of 102 F (38.9 C) or greater. You develop worsening pain at or near the incision site. MAKE SURE YOU:  Understand these instructions. Will watch your condition. Will get help right away if you are not doing well or get worse. Document Released: 09/28/2006 Document Revised: 08/10/2011 Document Reviewed: 11/29/2006 [facility] Patient Information 2014 [place], [place].   ________________________________________________________________________

## 2023-01-28 ENCOUNTER — Other Ambulatory Visit: Payer: Self-pay

## 2023-01-28 ENCOUNTER — Encounter (HOSPITAL_COMMUNITY): Payer: Self-pay

## 2023-01-28 ENCOUNTER — Encounter (HOSPITAL_COMMUNITY)
Admission: RE | Admit: 2023-01-28 | Discharge: 2023-01-28 | Disposition: A | Discharge status: Home / Self Care | Payer: No Typology Code available for payment source | Source: Ambulatory Visit | Attending: Orthopedic Surgery | Admitting: Orthopedic Surgery

## 2023-01-28 VITALS — BP 137/60 | HR 81 | Temp 98.0°F | Ht 69.0 in | Wt 153.0 lb

## 2023-01-28 DIAGNOSIS — I10 Essential (primary) hypertension: Secondary | ICD-10-CM | POA: Diagnosis not present

## 2023-01-28 DIAGNOSIS — E119 Type 2 diabetes mellitus without complications: Secondary | ICD-10-CM | POA: Insufficient documentation

## 2023-01-28 DIAGNOSIS — Z01812 Encounter for preprocedural laboratory examination: Secondary | ICD-10-CM | POA: Diagnosis not present

## 2023-01-28 DIAGNOSIS — Z01818 Encounter for other preprocedural examination: Secondary | ICD-10-CM | POA: Diagnosis present

## 2023-01-28 LAB — BASIC METABOLIC PANEL
Anion gap: 11 (ref 5–15)
BUN: 14 mg/dL (ref 8–23)
CO2: 22 mmol/L (ref 22–32)
Calcium: 9 mg/dL (ref 8.9–10.3)
Chloride: 104 mmol/L (ref 98–111)
Creatinine, Ser: 0.97 mg/dL (ref 0.61–1.24)
GFR, Estimated: >60 mL/min (ref >60)
Glucose, Bld: 139 mg/dL — ABNORMAL HIGH (ref 70–99)
Potassium: 4 mmol/L (ref 3.5–5.1)
Sodium: 137 mmol/L (ref 135–145)

## 2023-01-28 LAB — CBC
HCT: 40.9 % (ref 39.0–52.0)
Hemoglobin: 12.4 g/dL — ABNORMAL LOW (ref 13.0–17.0)
MCH: 29.7 pg (ref 26.0–34.0)
MCHC: 30.3 g/dL (ref 30.0–36.0)
MCV: 97.8 fL (ref 80.0–100.0)
Platelets: 271 10*3/uL (ref 150–400)
RBC: 4.18 MIL/uL — ABNORMAL LOW (ref 4.22–5.81)
RDW: 13.9 % (ref 11.5–15.5)
WBC: 4.7 10*3/uL (ref 4.0–10.5)
nRBC: 0 % (ref 0.0–0.2)

## 2023-01-28 LAB — GLUCOSE, CAPILLARY: Glucose-Capillary: 126 mg/dL — ABNORMAL HIGH (ref 70–99)

## 2023-01-28 NOTE — Progress Notes (Signed)
Pt. Refused blood products. 

## 2023-01-28 NOTE — Progress Notes (Signed)
For Short Stay: COVID SWAB appointment date:  Bowel Prep reminder:   For Anest[facility]: [facility]  Cardiologist - DR Senaida Ores   Chest x-ray -  EKG - 12/11/22 Stress Test -  ECHO - 06/02/2017 Cardiac Cath -  Pacemaker/ICD device last checked: Pacemaker orders received: Device Rep notified:  Spinal Cord Stimulator: N/A  Sleep Study - Yes CPAP - NO  Fasting Blood Sugar - 120's Checks Blood Sugar ___1__ times a day Date and result of last Hgb A1c- 7.1: 12/11/22  Last dose of GLP1 agonist- 01/25/23: semaglutide. GLP1 instructions:   Last dose of SGLT-2 inhibitors- N/A SGLT-2 instructions:   Blood Thinner Instructions: Aspirin Instructions: Last Dose:  Activity level: Can go up a flight of stairs and activities of daily living without stopping and without chest pain and/or shortness of breath   Able to exercise without chest pain and/or shortness of breat  Anesthesia review: Hx: HTN,DIA,Aorta valve stenosis,Heart murmur,CAD,MI,PVD.  Patient denies shortness of breath, fever, cough and chest pain at PAT appointment   Patient verbalized understanding of instructions that were given to them at the PAT appointment. Patient was also instructed that they will need to review over the PAT instructions again at home before surgery.

## 2023-02-03 ENCOUNTER — Ambulatory Visit: Payer: Self-pay | Admitting: Student

## 2023-02-03 NOTE — H&P (Signed)
PREOPERATIVE H&P  Chief Complaint: Status post right total knee arthroplasty with decreased range of motion.  HPI: Jeremy Nicholson is a 77 y.o. male who presents status pos right total knee arthroplasty, DOS: 12/16/22. The patient is taking Aspirin 81 mg twice a day for DVT prophylaxis. They are doing outpatient physical therapy. No fevers or chills. No chest pain or shortness of breath. Patient has decreased range of motion and is behind in terms of motion. Symptoms are rated as moderate to severe, and have been worsening.  This is significantly impairing activities of daily living.  He has elected for closed right knee manipulation under anesthesia.   Past Medical History:  Diagnosis Date   Aortic valve stenosis, moderate    per echo 06-02-2017 (in epic)  ef 55-60%,  possible bicuspid AV,  severeely thickened and calcified with severely restricted leaflet opening,  valve area 1.17cm^2, mean gradient , peak gradient ,  moderate AR   Arthritis    oa   BPH associated with nocturia    Cancer (HCC)    prostate cancer   Complication of anesthesia    due to heart murmur  mod to severe aortic stenosis   Coronary artery disease    Full dentures    Glaucoma, both eyes    Heart murmur, systolic    History of kidney stones    Hypertension    Left ureteral stone    Myocardial infarction (HCC)    mild in 11-2017 at [facility] F/U from [place] on chart   Peripheral vascular disease (HCC)    SIRS (systemic inflammatory response syndrome) (HCC) 05/31/2017   hospital admission--  unknown etiology   Type 2 diabetes mellitus (HCC)    Wears glasses    Past Surgical History:  Procedure Laterality Date   AORTIC VALVE REPLACEMENT     COLONOSCOPY     CORONARY ARTERY BYPASS GRAFT     pt reports Aortic valve replacement and CABG in 2020   CYSTOSCOPY WITH RETROGRADE PYELOGRAM, URETEROSCOPY AND STENT PLACEMENT Left 02/04/2018   Procedure: CYSTOSCOPY WITH RETROGRADE PYELOGRAM, URETEROSCOPY AND  STENT EXCHANGE,BASKETING OF STONE;  Surgeon: Sebastian Ache, MD;  Location: [facility];  Service: Urology;  Laterality: Left;   ENDARTERECTOMY FEMORAL Right 10/02/2021   Procedure: RIGHT COMMON FEMORAL ENDARTERECTOMY WITH 1CM X 14CM [name];  Surgeon: Victorino Sparrow, MD;  Location: [facility] OR;  Service: Vascular;  Laterality: Right;   EXAM UNDER ANESTHESIA WITH MANIPULATION OF KNEE Left 09/30/2017   Procedure: EXAM UNDER ANESTHESIA WITH MANIPULATION OF KNEE;  Surgeon: Eugenia Mcalpine, MD;  Location: [facility];  Service: Orthopedics;  Laterality: Left;  30 mins   INSERTION OF ILIAC STENT Right 10/02/2021   Procedure: INSERTION OF RIGHT ILIAC STENT;  Surgeon: Victorino Sparrow, MD;  Location: [facility] OR;  Service: Vascular;  Laterality: Right;   KNEE ARTHROPLASTY Right 12/16/2022   Procedure: COMPUTER ASSISTED TOTAL KNEE ARTHROPLASTY;  Surgeon: Samson Frederic, MD;  Location: [facility];  Service: Orthopedics;  Laterality: Right;  160   PATCH ANGIOPLASTY Right 10/02/2021   Procedure: PATCH ANGIOPLASTY;  Surgeon: Victorino Sparrow, MD;  Location: [facility] OR;  Service: Vascular;  Laterality: Right;   PROSTATE BIOPSY  07/2017   cancerous polyps x2  watching psa dr Berneice Heinrich at [facility]   TOTAL KNEE ARTHROPLASTY Left 05/28/2017   Procedure: LEFT TOTAL KNEE ARTHROPLASTY;  Surgeon: Eugenia Mcalpine, MD;  Location: [facility];  Service: Orthopedics;  Laterality: Left;   TRANSTHORACIC ECHOCARDIOGRAM  06/02/2017   ef 55-60%/  possible AV bicuspid,  severely thickened and calcified leaflets with severely restricted leaflet opening, moderate regurg.(valve area 1.17cm^2,  mean gradient , peak gradient 68mmHg)/  mild LAE/  mild TR/  mild MV thickened leaflets and regurg. (not restricted and no stenosis   VASCULAR SURGERY     Social History   Socioeconomic History   Marital status: Married    Spouse name: Not on file   Number of children: Not on file   Years of education: Not on file   Highest education level: Not on file   Occupational History   Not on file  Tobacco Use   Smoking status: Former    Current packs/day: 0.00    Types: Cigarettes    Start date: 01/27/1965    Quit date: 01/28/1995    Years since quitting: 28.0    Passive exposure: Never   Smokeless tobacco: Never  Vaping Use   Vaping status: Never Used  Substance and Sexual Activity   Alcohol use: No   Drug use: No   Sexual activity: Not Currently  Other Topics Concern   Not on file  Social History Narrative   Not on file   Social Determinants of Health   Financial Resource Strain: Not on file  Food Insecurity: No Food Insecurity (12/16/2022)   Hunger Vital Sign    Worried About Running Out of Food in the Last Year: Never true    Ran Out of Food in the Last Year: Never true  Transportation Needs: No Transportation Needs (12/16/2022)   PRAPARE - Administrator, Civil Service (Medical): No    Lack of Transportation (Non-Medical): No  Physical Activity: Not on file  Stress: Not on file  Social Connections: Not on file   No family history on file. Allergies  Allergen Reactions   Lipitor [Atorvastatin] Other (See Comments)    Muscle pain   Other     BLOOD PRODUCT REFUSAL   Pravastatin Other (See Comments)    Muscle pain   Crestor [Rosuvastatin] Other (See Comments)    Muscle pain   Latex Hives   Tape Hives    Latex tape   Prior to Admission medications   Medication Sig Start Date End Date Taking? Authorizing Provider  acetaminophen (TYLENOL) 500 MG tablet Take 2 tablets (1,000 mg total) by mouth every 8 (eight) hours as needed for moderate pain or mild pain. 12/17/22   Ninfa Giannelli, Alain Honey, PA-C  Brinzolamide-Brimonidine (SIMBRINZA) 1-0.2 % SUSP Place 1 drop into both eyes in the morning and at bedtime.    [provider]  carvedilol (COREG) 25 MG tablet Take 12.5 mg by mouth 2 (two) times daily with a meal.    [provider]  cetirizine (ZYRTEC) 10 MG tablet Take 10 mg by mouth daily as needed for  allergies. 08/19/20   [provider]  chlorthalidone (HYGROTON) 25 MG tablet Take 12.5 mg by mouth daily.    [provider]  cholecalciferol (VITAMIN D3) 25 MCG (1000 UNIT) tablet Take 1,000 Units by mouth daily.    [provider]  ezetimibe (ZETIA) 10 MG tablet Take 10 mg by mouth daily. 09/24/21   [provider]  finasteride (PROSCAR) 5 MG tablet Take 5 mg by mouth every morning.  09/09/17   [provider]  guaiFENesin (MUCINEX) 600 MG 12 hr tablet Take 600 mg by mouth 2 (two) times daily as needed for cough or to loosen phlegm.    [provider]  HYDROmorphone (DILAUDID)  2 MG tablet Take 2 mg by mouth 3 (three) times daily.    [provider]  latanoprost (XALATAN) 0.005 % ophthalmic solution Place 1 drop into both eyes at bedtime.     [provider]  Lidocaine HCl (PAIN RELIEF ROLL-ON EX) Apply 1 Application topically daily as needed (pain).    [provider]  lisinopril (ZESTRIL) 5 MG tablet Take 5 mg by mouth daily.    [provider]  metFORMIN (GLUCOPHAGE-XR) 500 MG 24 hr tablet Take 1,000 mg by mouth 2 (two) times daily.    [provider]  omeprazole (PRILOSEC) 20 MG capsule Take 20 mg by mouth daily as needed (acid reflux).    [provider]  ondansetron (ZOFRAN) 4 MG tablet Take 1 tablet (4 mg total) by mouth every 8 (eight) hours as needed for nausea or vomiting. 12/17/22 12/17/23  Clois Dupes, PA-C  Semaglutide, 1 MG/DOSE, (OZEMPIC, 1 MG/DOSE,) 2 MG/1.5ML SOPN Inject 1 mg into the skin every Monday.    [provider]  tamsulosin (FLOMAX) 0.4 MG CAPS capsule Take 0.4 mg by mouth at bedtime.     [provider]     Positive ROS: All other systems have been reviewed and were otherwise negative with the exception of those mentioned in the HPI and as above.  Physical Exam: General: Alert, no acute distress Cardiovascular: No pedal edema Respiratory: No  cyanosis, no use of accessory musculature GI: No organomegaly, abdomen is soft and non-tender Skin: No lesions in the area of chief complaint Neurologic: Sensation intact distally Psychiatric: Patient is competent for consent with normal mood and affect Lymphatic: No axillary or cervical lymphadenopathy  MUSCULOSKELETAL:  Examination of the right knee reveals a well healed incision. No drainage or erythema. No wound dehiscence. Range of motion 5 to 86 degrees (preoperative range of motion 5 to 110 degrees). No ligamentous instability.  Sensory and motor function intact in LE bilaterally. Distally, patient's foot is warm and well-perfused. He does not have palpable pedal pulses. No focal motor or sensory deficit. Single triphasic dopplerable dorsal pedal pulse.  Mild expected pedal edema and ecchymosis. Calves soft and non-tender.  Assessment: Status post right total knee arthroplasty with decreased range of motion.   Plan: Plan for right knee manipulation under anesthesia for improved range of motion.    Clois Dupes, PA-C [phone]   02/03/2023 8:34 AM

## 2023-02-03 NOTE — H&P (View-Only) (Signed)
PREOPERATIVE H&P  Chief Complaint: Status post right total knee arthroplasty with decreased range of motion.  HPI: Jeremy Nicholson is a 77 y.o. male who presents status pos right total knee arthroplasty, DOS: 12/16/22. The patient is taking Aspirin 81 mg twice a day for DVT prophylaxis. They are doing outpatient physical therapy. No fevers or chills. No chest pain or shortness of breath. Patient has decreased range of motion and is behind in terms of motion. Symptoms are rated as moderate to severe, and have been worsening.  This is significantly impairing activities of daily living.  He has elected for closed right knee manipulation under anesthesia.   Past Medical History:  Diagnosis Date   Aortic valve stenosis, moderate    per echo 06-02-2017 (in epic)  ef 55-60%,  possible bicuspid AV,  severeely thickened and calcified with severely restricted leaflet opening,  valve area 1.17cm^2, mean gradient , peak gradient ,  moderate AR   Arthritis    oa   BPH associated with nocturia    Cancer (HCC)    prostate cancer   Complication of anesthesia    due to heart murmur  mod to severe aortic stenosis   Coronary artery disease    Full dentures    Glaucoma, both eyes    Heart murmur, systolic    History of kidney stones    Hypertension    Left ureteral stone    Myocardial infarction (HCC)    mild in 11-2017 at [facility] F/U from [place] on chart   Peripheral vascular disease (HCC)    SIRS (systemic inflammatory response syndrome) (HCC) 05/31/2017   hospital admission--  unknown etiology   Type 2 diabetes mellitus (HCC)    Wears glasses    Past Surgical History:  Procedure Laterality Date   AORTIC VALVE REPLACEMENT     COLONOSCOPY     CORONARY ARTERY BYPASS GRAFT     pt reports Aortic valve replacement and CABG in 2020   CYSTOSCOPY WITH RETROGRADE PYELOGRAM, URETEROSCOPY AND STENT PLACEMENT Left 02/04/2018   Procedure: CYSTOSCOPY WITH RETROGRADE PYELOGRAM, URETEROSCOPY AND  STENT EXCHANGE,BASKETING OF STONE;  Surgeon: Sebastian Ache, MD;  Location: [facility];  Service: Urology;  Laterality: Left;   ENDARTERECTOMY FEMORAL Right 10/02/2021   Procedure: RIGHT COMMON FEMORAL ENDARTERECTOMY WITH 1CM X 14CM [name];  Surgeon: Victorino Sparrow, MD;  Location: [facility] OR;  Service: Vascular;  Laterality: Right;   EXAM UNDER ANESTHESIA WITH MANIPULATION OF KNEE Left 09/30/2017   Procedure: EXAM UNDER ANESTHESIA WITH MANIPULATION OF KNEE;  Surgeon: Eugenia Mcalpine, MD;  Location: [facility];  Service: Orthopedics;  Laterality: Left;  30 mins   INSERTION OF ILIAC STENT Right 10/02/2021   Procedure: INSERTION OF RIGHT ILIAC STENT;  Surgeon: Victorino Sparrow, MD;  Location: [facility] OR;  Service: Vascular;  Laterality: Right;   KNEE ARTHROPLASTY Right 12/16/2022   Procedure: COMPUTER ASSISTED TOTAL KNEE ARTHROPLASTY;  Surgeon: Samson Frederic, MD;  Location: [facility];  Service: Orthopedics;  Laterality: Right;  160   PATCH ANGIOPLASTY Right 10/02/2021   Procedure: PATCH ANGIOPLASTY;  Surgeon: Victorino Sparrow, MD;  Location: [facility] OR;  Service: Vascular;  Laterality: Right;   PROSTATE BIOPSY  07/2017   cancerous polyps x2  watching psa dr Berneice Heinrich at [facility]   TOTAL KNEE ARTHROPLASTY Left 05/28/2017   Procedure: LEFT TOTAL KNEE ARTHROPLASTY;  Surgeon: Eugenia Mcalpine, MD;  Location: [facility];  Service: Orthopedics;  Laterality: Left;   TRANSTHORACIC ECHOCARDIOGRAM  06/02/2017   ef 55-60%/  possible AV bicuspid,  severely thickened and calcified leaflets with severely restricted leaflet opening, moderate regurg.(valve area 1.17cm^2,  mean gradient , peak gradient 68mmHg)/  mild LAE/  mild TR/  mild MV thickened leaflets and regurg. (not restricted and no stenosis   VASCULAR SURGERY     Social History   Socioeconomic History   Marital status: Married    Spouse name: Not on file   Number of children: Not on file   Years of education: Not on file   Highest education level: Not on file   Occupational History   Not on file  Tobacco Use   Smoking status: Former    Current packs/day: 0.00    Types: Cigarettes    Start date: 01/27/1965    Quit date: 01/28/1995    Years since quitting: 28.0    Passive exposure: Never   Smokeless tobacco: Never  Vaping Use   Vaping status: Never Used  Substance and Sexual Activity   Alcohol use: No   Drug use: No   Sexual activity: Not Currently  Other Topics Concern   Not on file  Social History Narrative   Not on file   Social Determinants of Health   Financial Resource Strain: Not on file  Food Insecurity: No Food Insecurity (12/16/2022)   Hunger Vital Sign    Worried About Running Out of Food in the Last Year: Never true    Ran Out of Food in the Last Year: Never true  Transportation Needs: No Transportation Needs (12/16/2022)   PRAPARE - Administrator, Civil Service (Medical): No    Lack of Transportation (Non-Medical): No  Physical Activity: Not on file  Stress: Not on file  Social Connections: Not on file   No family history on file. Allergies  Allergen Reactions   Lipitor [Atorvastatin] Other (See Comments)    Muscle pain   Other     BLOOD PRODUCT REFUSAL   Pravastatin Other (See Comments)    Muscle pain   Crestor [Rosuvastatin] Other (See Comments)    Muscle pain   Latex Hives   Tape Hives    Latex tape   Prior to Admission medications   Medication Sig Start Date End Date Taking? Authorizing Provider  acetaminophen (TYLENOL) 500 MG tablet Take 2 tablets (1,000 mg total) by mouth every 8 (eight) hours as needed for moderate pain or mild pain. 12/17/22   Ninfa Giannelli, Alain Honey, PA-C  Brinzolamide-Brimonidine (SIMBRINZA) 1-0.2 % SUSP Place 1 drop into both eyes in the morning and at bedtime.    [provider]  carvedilol (COREG) 25 MG tablet Take 12.5 mg by mouth 2 (two) times daily with a meal.    [provider]  cetirizine (ZYRTEC) 10 MG tablet Take 10 mg by mouth daily as needed for  allergies. 08/19/20   [provider]  chlorthalidone (HYGROTON) 25 MG tablet Take 12.5 mg by mouth daily.    [provider]  cholecalciferol (VITAMIN D3) 25 MCG (1000 UNIT) tablet Take 1,000 Units by mouth daily.    [provider]  ezetimibe (ZETIA) 10 MG tablet Take 10 mg by mouth daily. 09/24/21   [provider]  finasteride (PROSCAR) 5 MG tablet Take 5 mg by mouth every morning.  09/09/17   [provider]  guaiFENesin (MUCINEX) 600 MG 12 hr tablet Take 600 mg by mouth 2 (two) times daily as needed for cough or to loosen phlegm.    [provider]  HYDROmorphone (DILAUDID)  2 MG tablet Take 2 mg by mouth 3 (three) times daily.    [provider]  latanoprost (XALATAN) 0.005 % ophthalmic solution Place 1 drop into both eyes at bedtime.     [provider]  Lidocaine HCl (PAIN RELIEF ROLL-ON EX) Apply 1 Application topically daily as needed (pain).    [provider]  lisinopril (ZESTRIL) 5 MG tablet Take 5 mg by mouth daily.    [provider]  metFORMIN (GLUCOPHAGE-XR) 500 MG 24 hr tablet Take 1,000 mg by mouth 2 (two) times daily.    [provider]  omeprazole (PRILOSEC) 20 MG capsule Take 20 mg by mouth daily as needed (acid reflux).    [provider]  ondansetron (ZOFRAN) 4 MG tablet Take 1 tablet (4 mg total) by mouth every 8 (eight) hours as needed for nausea or vomiting. 12/17/22 12/17/23  Clois Dupes, PA-C  Semaglutide, 1 MG/DOSE, (OZEMPIC, 1 MG/DOSE,) 2 MG/1.5ML SOPN Inject 1 mg into the skin every Monday.    [provider]  tamsulosin (FLOMAX) 0.4 MG CAPS capsule Take 0.4 mg by mouth at bedtime.     [provider]     Positive ROS: All other systems have been reviewed and were otherwise negative with the exception of those mentioned in the HPI and as above.  Physical Exam: General: Alert, no acute distress Cardiovascular: No pedal edema Respiratory: No  cyanosis, no use of accessory musculature GI: No organomegaly, abdomen is soft and non-tender Skin: No lesions in the area of chief complaint Neurologic: Sensation intact distally Psychiatric: Patient is competent for consent with normal mood and affect Lymphatic: No axillary or cervical lymphadenopathy  MUSCULOSKELETAL:  Examination of the right knee reveals a well healed incision. No drainage or erythema. No wound dehiscence. Range of motion 5 to 86 degrees (preoperative range of motion 5 to 110 degrees). No ligamentous instability.  Sensory and motor function intact in LE bilaterally. Distally, patient's foot is warm and well-perfused. He does not have palpable pedal pulses. No focal motor or sensory deficit. Single triphasic dopplerable dorsal pedal pulse.  Mild expected pedal edema and ecchymosis. Calves soft and non-tender.  Assessment: Status post right total knee arthroplasty with decreased range of motion.   Plan: Plan for right knee manipulation under anesthesia for improved range of motion.    Clois Dupes, PA-C [phone]   02/03/2023 8:34 AM

## 2023-02-04 ENCOUNTER — Encounter (HOSPITAL_COMMUNITY)
Admission: RE | Disposition: A | Discharge status: Home / Self Care | Payer: Self-pay | Source: Ambulatory Visit | Attending: Orthopedic Surgery

## 2023-02-04 ENCOUNTER — Ambulatory Visit (HOSPITAL_COMMUNITY)
Admission: RE | Admit: 2023-02-04 | Discharge: 2023-02-04 | Disposition: A | Discharge status: Home / Self Care | Payer: No Typology Code available for payment source | Source: Ambulatory Visit | Attending: Orthopedic Surgery | Admitting: Orthopedic Surgery

## 2023-02-04 ENCOUNTER — Encounter (HOSPITAL_COMMUNITY): Payer: Self-pay | Admitting: Orthopedic Surgery

## 2023-02-04 ENCOUNTER — Ambulatory Visit (HOSPITAL_COMMUNITY): Payer: No Typology Code available for payment source | Admitting: Certified Registered"

## 2023-02-04 ENCOUNTER — Ambulatory Visit (HOSPITAL_COMMUNITY): Payer: No Typology Code available for payment source | Admitting: Physician Assistant

## 2023-02-04 ENCOUNTER — Ambulatory Visit (HOSPITAL_COMMUNITY): Payer: No Typology Code available for payment source

## 2023-02-04 DIAGNOSIS — M24661 Ankylosis, right knee: Secondary | ICD-10-CM | POA: Insufficient documentation

## 2023-02-04 DIAGNOSIS — H409 Unspecified glaucoma: Secondary | ICD-10-CM | POA: Diagnosis not present

## 2023-02-04 DIAGNOSIS — Z01818 Encounter for other preprocedural examination: Secondary | ICD-10-CM

## 2023-02-04 DIAGNOSIS — T8482XA Fibrosis due to internal orthopedic prosthetic devices, implants and grafts, initial encounter: Secondary | ICD-10-CM

## 2023-02-04 DIAGNOSIS — I10 Essential (primary) hypertension: Secondary | ICD-10-CM | POA: Diagnosis not present

## 2023-02-04 DIAGNOSIS — Z87891 Personal history of nicotine dependence: Secondary | ICD-10-CM | POA: Insufficient documentation

## 2023-02-04 DIAGNOSIS — K219 Gastro-esophageal reflux disease without esophagitis: Secondary | ICD-10-CM | POA: Diagnosis not present

## 2023-02-04 DIAGNOSIS — I2510 Atherosclerotic heart disease of native coronary artery without angina pectoris: Secondary | ICD-10-CM | POA: Insufficient documentation

## 2023-02-04 DIAGNOSIS — E1151 Type 2 diabetes mellitus with diabetic peripheral angiopathy without gangrene: Secondary | ICD-10-CM | POA: Diagnosis not present

## 2023-02-04 DIAGNOSIS — Z96653 Presence of artificial knee joint, bilateral: Secondary | ICD-10-CM | POA: Diagnosis not present

## 2023-02-04 DIAGNOSIS — Z79899 Other long term (current) drug therapy: Secondary | ICD-10-CM | POA: Insufficient documentation

## 2023-02-04 DIAGNOSIS — Z7985 Long-term (current) use of injectable non-insulin antidiabetic drugs: Secondary | ICD-10-CM | POA: Diagnosis not present

## 2023-02-04 DIAGNOSIS — Z951 Presence of aortocoronary bypass graft: Secondary | ICD-10-CM | POA: Diagnosis not present

## 2023-02-04 DIAGNOSIS — Z7982 Long term (current) use of aspirin: Secondary | ICD-10-CM | POA: Diagnosis not present

## 2023-02-04 DIAGNOSIS — I252 Old myocardial infarction: Secondary | ICD-10-CM | POA: Diagnosis not present

## 2023-02-04 DIAGNOSIS — E119 Type 2 diabetes mellitus without complications: Secondary | ICD-10-CM

## 2023-02-04 DIAGNOSIS — Z952 Presence of prosthetic heart valve: Secondary | ICD-10-CM | POA: Diagnosis not present

## 2023-02-04 DIAGNOSIS — Z7984 Long term (current) use of oral hypoglycemic drugs: Secondary | ICD-10-CM | POA: Insufficient documentation

## 2023-02-04 HISTORY — PX: KNEE CLOSED REDUCTION: SHX995

## 2023-02-04 LAB — GLUCOSE, CAPILLARY
Glucose-Capillary: 156 mg/dL — ABNORMAL HIGH (ref 70–99)
Glucose-Capillary: 93 mg/dL (ref 70–99)

## 2023-02-04 SURGERY — MANIPULATION, KNEE, CLOSED
Anesthesia: General | Site: Knee | Laterality: Right

## 2023-02-04 MED ORDER — LIDOCAINE 2% (20 MG/ML) 5 ML SYRINGE
INTRAMUSCULAR | Status: DC | PRN
  Administered 2023-02-04: 80 mg via INTRAVENOUS

## 2023-02-04 MED ORDER — FENTANYL CITRATE PF 50 MCG/ML IJ SOSY
50.0000 ug | PREFILLED_SYRINGE | Freq: Once | INTRAMUSCULAR | Status: AC
  Administered 2023-02-04: 50 ug via INTRAVENOUS
  Filled 2023-02-04: qty 2

## 2023-02-04 MED ORDER — FENTANYL CITRATE (PF) 100 MCG/2ML IJ SOLN
INTRAMUSCULAR | Status: AC
  Filled 2023-02-04: qty 2

## 2023-02-04 MED ORDER — POVIDONE-IODINE 10 % EX SWAB: 2.0000 | Freq: Once | CUTANEOUS | Status: DC

## 2023-02-04 MED ORDER — SUCCINYLCHOLINE CHLORIDE 200 MG/10ML IV SOSY
PREFILLED_SYRINGE | INTRAVENOUS | Status: DC | PRN
Start: 2023-02-04 — End: 2023-02-04
  Administered 2023-02-04: 100 mg via INTRAVENOUS

## 2023-02-04 MED ORDER — DEXAMETHASONE SODIUM PHOSPHATE 10 MG/ML IJ SOLN
INTRAMUSCULAR | Status: DC | PRN
  Administered 2023-02-04: 10 mg via INTRAVENOUS

## 2023-02-04 MED ORDER — POVIDONE-IODINE 10 % EX SWAB
2.0000 | Freq: Once | CUTANEOUS | Status: AC
  Administered 2023-02-04: 2 via TOPICAL

## 2023-02-04 MED ORDER — ORAL CARE MOUTH RINSE: 15.0000 mL | Freq: Once | OROMUCOSAL | Status: AC

## 2023-02-04 MED ORDER — ONDANSETRON HCL 4 MG/2ML IJ SOLN
INTRAMUSCULAR | Status: AC
  Filled 2023-02-04: qty 2

## 2023-02-04 MED ORDER — PROPOFOL 10 MG/ML IV BOLUS
INTRAVENOUS | Status: DC | PRN
  Administered 2023-02-04: 160 mg via INTRAVENOUS

## 2023-02-04 MED ORDER — HYDROMORPHONE HCL 1 MG/ML IJ SOLN: 0.2500 mg | INTRAMUSCULAR | Status: DC | PRN

## 2023-02-04 MED ORDER — LACTATED RINGERS IV SOLN: INTRAVENOUS | Status: DC

## 2023-02-04 MED ORDER — INSULIN ASPART 100 UNIT/ML IJ SOLN: 0.0000 [IU] | INTRAMUSCULAR | Status: DC | PRN

## 2023-02-04 MED ORDER — ACETAMINOPHEN 500 MG PO TABS
1000.0000 mg | ORAL_TABLET | Freq: Once | ORAL | Status: AC
  Administered 2023-02-04: 1000 mg via ORAL
  Filled 2023-02-04: qty 2

## 2023-02-04 MED ORDER — DEXAMETHASONE SODIUM PHOSPHATE 10 MG/ML IJ SOLN
INTRAMUSCULAR | Status: AC
  Filled 2023-02-04: qty 1

## 2023-02-04 MED ORDER — FENTANYL CITRATE (PF) 100 MCG/2ML IJ SOLN
INTRAMUSCULAR | Status: DC | PRN
  Administered 2023-02-04: 50 ug via INTRAVENOUS

## 2023-02-04 MED ORDER — OXYCODONE HCL 5 MG/5ML PO SOLN: 5.0000 mg | Freq: Once | ORAL | Status: DC | PRN

## 2023-02-04 MED ORDER — CHLORHEXIDINE GLUCONATE 0.12 % MT SOLN
15.0000 mL | Freq: Once | OROMUCOSAL | Status: AC
  Administered 2023-02-04: 15 mL via OROMUCOSAL

## 2023-02-04 MED ORDER — OXYCODONE HCL 5 MG PO TABS: 5.0000 mg | ORAL_TABLET | Freq: Once | ORAL | Status: DC | PRN

## 2023-02-04 MED ORDER — MIDAZOLAM HCL 2 MG/2ML IJ SOLN
1.0000 mg | Freq: Once | INTRAMUSCULAR | Status: AC
  Administered 2023-02-04: 1 mg via INTRAVENOUS
  Filled 2023-02-04: qty 2

## 2023-02-04 MED ORDER — ONDANSETRON HCL 4 MG/2ML IJ SOLN: 4.0000 mg | Freq: Once | INTRAMUSCULAR | Status: DC | PRN

## 2023-02-04 MED ORDER — PROPOFOL 10 MG/ML IV BOLUS
INTRAVENOUS | Status: AC
  Filled 2023-02-04: qty 20

## 2023-02-04 MED ORDER — ONDANSETRON HCL 4 MG/2ML IJ SOLN
INTRAMUSCULAR | Status: DC | PRN
  Administered 2023-02-04: 4 mg via INTRAVENOUS

## 2023-02-04 MED ORDER — ROPIVACAINE HCL 5 MG/ML IJ SOLN
INTRAMUSCULAR | Status: DC | PRN
Start: 2023-02-04 — End: 2023-02-04
  Administered 2023-02-04: 30 mL via PERINEURAL

## 2023-02-04 MED ORDER — SUCCINYLCHOLINE CHLORIDE 200 MG/10ML IV SOSY
PREFILLED_SYRINGE | INTRAVENOUS | Status: AC
  Filled 2023-02-04: qty 10

## 2023-02-04 MED ORDER — LACTATED RINGERS IV BOLUS
500.0000 mL | Freq: Once | INTRAVENOUS | Status: AC
  Administered 2023-02-04: 500 mL via INTRAVENOUS

## 2023-02-04 SURGICAL SUPPLY — 26 items
APL PRP STRL LF DISP 70% ISPRP (MISCELLANEOUS) ×1
BAG COUNTER SPONGE SURGICOUNT (BAG) IMPLANT
BAG SPNG CNTER NS LX DISP (BAG)
BNDG ADH 1X3 SHEER STRL LF (GAUZE/BANDAGES/DRESSINGS) IMPLANT
BNDG ADH THN 3X1 STRL LF (GAUZE/BANDAGES/DRESSINGS)
CHLORAPREP W/TINT 26 (MISCELLANEOUS) ×1 IMPLANT
COVER SURGICAL LIGHT HANDLE (MISCELLANEOUS) ×1 IMPLANT
GAUZE SPONGE 4X4 12PLY STRL (GAUZE/BANDAGES/DRESSINGS) IMPLANT
GLOVE BIOGEL M 7.0 STRL (GLOVE) ×1 IMPLANT
GLOVE BIOGEL PI IND STRL 7.5 (GLOVE) ×2 IMPLANT
GLOVE BIOGEL PI IND STRL 8 (GLOVE) ×1 IMPLANT
GLOVE BIOGEL PI IND STRL 8.5 (GLOVE) ×1 IMPLANT
GLOVE ECLIPSE 8.0 STRL XLNG CF (GLOVE) IMPLANT
GLOVE ORTHO TXT STRL SZ7.5 (GLOVE) ×2 IMPLANT
GLOVE SURG LX STRL 7.5 STRW (GLOVE) ×2 IMPLANT
GLOVE SURG ORTHO 8.0 STRL STRW (GLOVE) ×1 IMPLANT
GOWN SPEC L3 XXLG W/TWL (GOWN DISPOSABLE) ×2 IMPLANT
JET LAVAGE IRRISEPT WOUND (IRRIGATION / IRRIGATOR)
KIT TURNOVER KIT A (KITS) IMPLANT
LAVAGE JET IRRISEPT WOUND (IRRIGATION / IRRIGATOR) IMPLANT
MANIFOLD NEPTUNE II (INSTRUMENTS) ×1 IMPLANT
NDL SAFETY ECLIP 18X1.5 (MISCELLANEOUS) IMPLANT
PENCIL SMOKE EVACUATOR (MISCELLANEOUS) IMPLANT
PROTECTOR NERVE ULNAR (MISCELLANEOUS) ×1 IMPLANT
SYR CONTROL 10ML LL (SYRINGE) IMPLANT
TOWEL OR 17X26 10 PK STRL BLUE (TOWEL DISPOSABLE) ×2 IMPLANT

## 2023-02-04 NOTE — Anesthesia Procedure Notes (Addendum)
  Anesthesia Regional Block: Adductor canal block   Pre-Anesthetic Checklist: , timeout performed,  Correct Patient, Correct Site, Correct Laterality,  Correct Procedure, Correct Position, site marked,  Risks and benefits discussed,  Surgical consent,  Pre-op evaluation,  At surgeon's request and post-op pain management  Laterality: Right  Prep: chloraprep       Needles:  Injection technique: Single-shot      Additional Needles:   Procedures:,,,, ultrasound used (permanent image in chart),,    Narrative:  Start time: 02/04/2023 1:08 PM End time: 02/04/2023 1:13 PM Injection made incrementally with aspirations every 5 mL.  Performed by: Personally  Anesthesiologist: Mal Amabile, MD

## 2023-02-04 NOTE — Transfer of Care (Signed)
Immediate Anesthesia Transfer of Care Note  Patient: Jeremy Nicholson  Procedure(s) Performed: CLOSED MANIPULATION KNEE (Right: Knee)  Patient Location: PACU  Anesthesia Type:General  Level of Consciousness: awake, alert , and oriented  Airway & Oxygen Therapy: Patient Spontanous Breathing and Patient connected to nasal cannula oxygen  Post-op Assessment: Report given to RN and Post -op Vital signs reviewed and stable  Post vital signs: Reviewed and stable  Last Vitals:  Vitals Value Taken Time  BP 97/48 02/04/23 1348  Temp    Pulse 66 02/04/23 1350  Resp 12 02/04/23 1350  SpO2 100 % 02/04/23 1350  Vitals shown include unfiled device data.  Last Pain:  Vitals:   02/04/23 1319  TempSrc:   PainSc: 0-No pain         Complications: No notable events documented.

## 2023-02-04 NOTE — Anesthesia Postprocedure Evaluation (Signed)
Anesthesia Post Note  Patient: Jeremy Nicholson  Procedure(s) Performed: CLOSED MANIPULATION KNEE (Right: Knee)     Patient location during evaluation: PACU Anesthesia Type: General Level of consciousness: awake and alert and oriented Pain management: pain level controlled Vital Signs Assessment: post-procedure vital signs reviewed and stable Respiratory status: spontaneous breathing, nonlabored ventilation and respiratory function stable Cardiovascular status: blood pressure returned to baseline and stable Postop Assessment: no apparent nausea or vomiting Anesthetic complications: no   No notable events documented.  Last Vitals:  Vitals:   02/04/23 1400 02/04/23 1411  BP: (!) 107/57   Pulse: 66   Resp: 19   Temp:  (!) 36.4 C  SpO2: 100%     Last Pain:  Vitals:   02/04/23 1400  TempSrc:   PainSc: Asleep                 Divine Hansley A.

## 2023-02-04 NOTE — Anesthesia Procedure Notes (Signed)
Procedure Name: LMA Insertion Date/Time: 02/04/2023 1:30 PM  Performed by: Mykell Genao D, CRNAPre-anesthesia Checklist: Patient identified, Emergency Drugs available, Suction available and Patient being monitored Patient Re-evaluated:Patient Re-evaluated prior to induction Oxygen Delivery Method: Circle system utilized Preoxygenation: Pre-oxygenation with 100% oxygen Induction Type: IV induction Ventilation: Mask ventilation without difficulty LMA: LMA inserted LMA Size: 4.0 Tube type: Oral Number of attempts: 1 Placement Confirmation: positive ETCO2 and breath sounds checked- equal and bilateral Tube secured with: Tape Dental Injury: Teeth and Oropharynx as per pre-operative assessment

## 2023-02-04 NOTE — Op Note (Signed)
OPERATIVE REPORT   02/04/2023  1:44 PM  PATIENT:  Vilma Prader   SURGEON:  Jonette Pesa, MD  ASSISTANT:  Clint Bolder, PA-C.   PREOPERATIVE DIAGNOSIS:  Right knee arthrofibrosis  POSTOPERATIVE DIAGNOSIS:  Same.  PROCEDURE:  Right knee manipulation under anesthesia.  ANESTHESIA:   GETA.  ANTIBIOTICS:  None.  IMPLANTS:  None.  SPECIMENS:  None.  COMPLICATIONS:  None.  DISPOSITION:  Stable to PACU.  SURGICAL INDICATIONS:  Keval Wolske is a 77 y.o. male who underwent primary right total knee arthroplasty on 12/16/2022.  He has had difficulty achieving postoperative range of motion with physical therapy.  He was indicated for manipulation of the right knee under anesthesia.  We discussed the risk, benefits, and alternatives, and he elected to proceed.  PROCEDURE IN DETAIL: Patient was identified in the holding area using 2 identifiers.  The surgical site was marked by myself.  Regional block was placed by anesthesia.  He was taken to the operating room, and general anesthesia was induced on the stretcher.  Timeout was called, verifying side and site of surgery.  Preoperative antibiotics were not given as none were indicated.  Range of motion today is 5 to 85 degrees.  Using gentle pressure over the proximal tibia I was able to manipulate the knee to 105 degrees of flexion.  The patient was then awakened from anesthesia and taken to the PACU in stable condition.  There were no known complications.

## 2023-02-04 NOTE — Anesthesia Preprocedure Evaluation (Addendum)
Anesthesia Evaluation  Patient identified by MRN, date of birth, ID band Patient awake    Reviewed: Allergy & Precautions, NPO status , Patient's Chart, lab work & pertinent test results, reviewed documented beta blocker date and time   History of Anesthesia Complications (+) history of anesthetic complications  Airway Mallampati: I  TM Distance: >3 FB Neck ROM: Full    Dental  (+) Edentulous Upper, Edentulous Lower   Pulmonary former smoker Chronic hoarseness   breath sounds clear to auscultation       Cardiovascular hypertension, Pt. on medications (-) angina + CAD, + Past MI, + CABG and + Peripheral Vascular Disease  + Valvular Problems/Murmurs (s/p AVR) AS  Rhythm:Regular Rate:Normal  Echocardiogram: 03/07/2019 Post-Intervention Summary The left ventricle is normal in size. Left ventricular systolic function is  normal. Ejection Fraction = >55%. The right ventricular systolic function is mildly reduced.   Left atrial appendage has been excluded. No change in mitral valve. There is trace tricuspid regurgitation. Magnaease valve in place with good leaflet motion. Well positioned  supra-annular position of leaflets. No paravalvular leak. Peak gradient  .     Neuro/Psych Glaucoma  negative psych ROS   GI/Hepatic Neg liver ROS,GERD  Medicated,,  Endo/Other  diabetes, Well Controlled, Oral Hypoglycemic Agents  ozempic  Renal/GU Renal InsufficiencyRenal disease  negative genitourinary   Musculoskeletal  (+) Arthritis , Osteoarthritis,  Right knee arthrofibrosis S/P Right TKR   Abdominal   Peds  Hematology negative hematology ROS (+)   Anesthesia Other Findings   Reproductive/Obstetrics                             Anesthesia Physical Anesthesia Plan  ASA: 3  Anesthesia Plan: General   Post-op Pain Management: Regional block*, Tylenol PO (pre-op)* and Dilaudid IV   Induction:  Intravenous  PONV Risk Score and Plan: 3 and Treatment may vary due to age or medical condition and TIVA  Airway Management Planned: Natural Airway and Mask  Additional Equipment: None  Intra-op Plan:   Post-operative Plan:   Informed Consent:      Dental advisory given  Plan Discussed with: CRNA, Surgeon and Anesthesiologist  Anesthesia Plan Comments: (See PAT note 12/11/2022)       Anesthesia Quick Evaluation

## 2023-02-04 NOTE — Discharge Instructions (Signed)
Weightbearing as tolerated.  Continue physical therapy with aggressive range of motion.  Take pain medication for motion progression and physical therapy.

## 2023-02-04 NOTE — Interval H&P Note (Signed)
History and Physical Interval Note:  02/04/2023 11:24 AM  Jeremy Nicholson  has presented today for surgery, with the diagnosis of Right knee arthrofibrosis.  The various methods of treatment have been discussed with the patient and family. After consideration of risks, benefits and other options for treatment, the patient has consented to  Procedure(s) with comments: CLOSED MANIPULATION KNEE (Right) - 20 as a surgical intervention.  The patient's history has been reviewed, patient examined, no change in status, stable for surgery.  I have reviewed the patient's chart and labs.  Questions were answered to the patient's satisfaction.     [name]

## 2023-02-05 ENCOUNTER — Encounter (HOSPITAL_COMMUNITY): Payer: Self-pay | Admitting: Orthopedic Surgery

## 2023-02-16 DIAGNOSIS — E440 Moderate protein-calorie malnutrition: Secondary | ICD-10-CM | POA: Diagnosis not present

## 2023-02-16 DIAGNOSIS — Z96651 Presence of right artificial knee joint: Secondary | ICD-10-CM | POA: Diagnosis not present

## 2023-02-16 DIAGNOSIS — E1122 Type 2 diabetes mellitus with diabetic chronic kidney disease: Secondary | ICD-10-CM | POA: Diagnosis not present

## 2023-02-16 DIAGNOSIS — I10 Essential (primary) hypertension: Secondary | ICD-10-CM | POA: Diagnosis not present

## 2023-02-16 DIAGNOSIS — N1831 Chronic kidney disease, stage 3a: Secondary | ICD-10-CM | POA: Diagnosis not present

## 2023-02-16 DIAGNOSIS — R634 Abnormal weight loss: Secondary | ICD-10-CM | POA: Diagnosis not present

## 2023-04-01 ENCOUNTER — Other Ambulatory Visit: Payer: Self-pay | Admitting: Urology

## 2023-04-01 DIAGNOSIS — C61 Malignant neoplasm of prostate: Secondary | ICD-10-CM

## 2023-05-17 DIAGNOSIS — I959 Hypotension, unspecified: Secondary | ICD-10-CM | POA: Diagnosis not present

## 2023-05-17 DIAGNOSIS — E1151 Type 2 diabetes mellitus with diabetic peripheral angiopathy without gangrene: Secondary | ICD-10-CM | POA: Diagnosis not present

## 2023-05-17 DIAGNOSIS — N182 Chronic kidney disease, stage 2 (mild): Secondary | ICD-10-CM | POA: Diagnosis not present

## 2023-05-17 DIAGNOSIS — N400 Enlarged prostate without lower urinary tract symptoms: Secondary | ICD-10-CM | POA: Diagnosis not present

## 2023-05-17 DIAGNOSIS — I10 Essential (primary) hypertension: Secondary | ICD-10-CM | POA: Diagnosis not present

## 2023-05-17 DIAGNOSIS — E785 Hyperlipidemia, unspecified: Secondary | ICD-10-CM | POA: Diagnosis not present

## 2023-05-17 DIAGNOSIS — Z0000 Encounter for general adult medical examination without abnormal findings: Secondary | ICD-10-CM | POA: Diagnosis not present

## 2023-05-17 DIAGNOSIS — Z1331 Encounter for screening for depression: Secondary | ICD-10-CM | POA: Diagnosis not present

## 2023-05-17 DIAGNOSIS — E1122 Type 2 diabetes mellitus with diabetic chronic kidney disease: Secondary | ICD-10-CM | POA: Diagnosis not present

## 2023-06-04 ENCOUNTER — Encounter: Payer: Self-pay | Admitting: Urology

## 2023-06-07 ENCOUNTER — Ambulatory Visit
Admission: RE | Admit: 2023-06-07 | Discharge: 2023-06-07 | Discharge status: Home / Self Care | Payer: Medicare PPO | Source: Ambulatory Visit | Attending: Urology | Admitting: Urology

## 2023-06-07 DIAGNOSIS — C61 Malignant neoplasm of prostate: Secondary | ICD-10-CM

## 2023-06-07 MED ORDER — GADOPICLENOL 0.5 MMOL/ML IV SOLN
7.5000 mL | Freq: Once | INTRAVENOUS | Status: AC | PRN
  Administered 2023-06-07: 7.5 mL via INTRAVENOUS

## 2023-06-21 DIAGNOSIS — C61 Malignant neoplasm of prostate: Secondary | ICD-10-CM | POA: Diagnosis not present

## 2023-06-21 DIAGNOSIS — N5201 Erectile dysfunction due to arterial insufficiency: Secondary | ICD-10-CM | POA: Diagnosis not present

## 2023-06-21 DIAGNOSIS — R338 Other retention of urine: Secondary | ICD-10-CM | POA: Diagnosis not present

## 2023-10-19 ENCOUNTER — Other Ambulatory Visit: Payer: Self-pay | Admitting: *Deleted

## 2023-10-19 DIAGNOSIS — I70223 Atherosclerosis of native arteries of extremities with rest pain, bilateral legs: Secondary | ICD-10-CM

## 2023-10-19 DIAGNOSIS — Z95828 Presence of other vascular implants and grafts: Secondary | ICD-10-CM

## 2023-10-19 DIAGNOSIS — I739 Peripheral vascular disease, unspecified: Secondary | ICD-10-CM

## 2023-10-28 ENCOUNTER — Encounter: Payer: Self-pay | Admitting: Physician Assistant

## 2023-10-28 ENCOUNTER — Ambulatory Visit (HOSPITAL_COMMUNITY)
Admission: RE | Admit: 2023-10-28 | Discharge: 2023-10-28 | Disposition: A | Discharge status: Home / Self Care | Payer: Medicare PPO | Source: Ambulatory Visit | Attending: Vascular Surgery | Admitting: Vascular Surgery

## 2023-10-28 ENCOUNTER — Other Ambulatory Visit: Payer: Self-pay

## 2023-10-28 ENCOUNTER — Ambulatory Visit: Payer: Medicare PPO | Attending: Vascular Surgery | Admitting: Physician Assistant

## 2023-10-28 VITALS — BP 153/68 | HR 68 | Temp 98.5°F | Ht 69.0 in | Wt 159.2 lb

## 2023-10-28 DIAGNOSIS — I739 Peripheral vascular disease, unspecified: Secondary | ICD-10-CM | POA: Diagnosis not present

## 2023-10-28 DIAGNOSIS — Z95828 Presence of other vascular implants and grafts: Secondary | ICD-10-CM

## 2023-10-28 DIAGNOSIS — I70223 Atherosclerosis of native arteries of extremities with rest pain, bilateral legs: Secondary | ICD-10-CM

## 2023-10-28 LAB — VAS US ABI WITH/WO TBI
Left ABI: 0.88
Right ABI: 0.68

## 2023-10-28 NOTE — Progress Notes (Signed)
 Office Note     CC:  follow up Requesting Provider:  Sun, Vyvyan, MD  HPI: Jeremy Nicholson is a 78 y.o. (1946/02/22) male who presents for routine follow up of peripheral artery disease. He has history of right external iliac artery stenting, iliofemoral endarterectomy, and bovine patch angioplasty by Dr. Rosalva Comber on 10/02/2021. This was performed due to rest pain of the right foot. He has known bilateral SFA occlusions. His rest pain resolved post intervention. He has been without any claudication symptoms or tissue loss.   Today he reports overall he is doing well. He has been experiencing some swelling in his feet and toes and some redness. This usually occurs at the end of the day after he has been up on his legs a while. Usually resolved upon first waking. No associated pain. No Tissue loss. He has been experiencing some early symptoms of neuropathy. He is seeing Podiatry as well as PCP for this. He currently is not taking any medication for it but is looking into alternative treatment options. He denies any pain in his legs otherwise at rest or on ambulation. He had his right knee replaced in July of 2024 and he says he is still doing therapy right now for it. Having a little limited ROM and slower to heal then his left. He tries to remain active as he can. He walks mostly with a cane now. He is medically managed on Aspirin  Statin   Past Medical History:  Diagnosis Date   Aortic valve stenosis, moderate    per echo 06-02-2017 (in epic)  ef 55-60%,  possible bicuspid AV,  severeely thickened and calcified with severely restricted leaflet opening,  valve area 1.17cm^2, mean gradient , peak gradient ,  moderate AR   Arthritis    oa   BPH associated with nocturia    Cancer (HCC)    prostate cancer   Complication of anesthesia    due to heart murmur  mod to severe aortic stenosis   Coronary artery disease    Full dentures    Glaucoma, both eyes    Heart murmur, systolic     History of kidney stones    Hypertension    Left ureteral stone    Myocardial infarction (HCC)    mild in 11-2017 at [facility] F/U from [place] on chart   Peripheral vascular disease (HCC)    SIRS (systemic inflammatory response syndrome) (HCC) 05/31/2017   hospital admission--  unknown etiology   Type 2 diabetes mellitus (HCC)    Wears glasses     Past Surgical History:  Procedure Laterality Date   AORTIC VALVE REPLACEMENT     COLONOSCOPY     CORONARY ARTERY BYPASS GRAFT     pt reports Aortic valve replacement and CABG in 2020   CYSTOSCOPY WITH RETROGRADE PYELOGRAM, URETEROSCOPY AND STENT PLACEMENT Left 02/04/2018   Procedure: CYSTOSCOPY WITH RETROGRADE PYELOGRAM, URETEROSCOPY AND STENT EXCHANGE,BASKETING OF STONE;  Surgeon: Osborn Blaze, MD;  Location: [facility];  Service: Urology;  Laterality: Left;   ENDARTERECTOMY FEMORAL Right 10/02/2021   Procedure: RIGHT COMMON FEMORAL ENDARTERECTOMY WITH 1CM X 14CM Hazeline Lister;  Surgeon: Kayla Part, MD;  Location: [facility] OR;  Service: Vascular;  Laterality: Right;   EXAM UNDER ANESTHESIA WITH MANIPULATION OF KNEE Left 09/30/2017   Procedure: EXAM UNDER ANESTHESIA WITH MANIPULATION OF KNEE;  Surgeon: Genevie Kerns, MD;  Location: [facility];  Service: Orthopedics;  Laterality: Left;  30 mins   INSERTION OF ILIAC STENT Right 10/02/2021  Procedure: INSERTION OF RIGHT ILIAC STENT;  Surgeon: Kayla Part, MD;  Location: [facility] OR;  Service: Vascular;  Laterality: Right;   KNEE ARTHROPLASTY Right 12/16/2022   Procedure: COMPUTER ASSISTED TOTAL KNEE ARTHROPLASTY;  Surgeon: Adonica Hoose, MD;  Location: [facility];  Service: Orthopedics;  Laterality: Right;  160   KNEE CLOSED REDUCTION Right 02/04/2023   Procedure: CLOSED MANIPULATION KNEE;  Surgeon: Adonica Hoose, MD;  Location: [facility];  Service: Orthopedics;  Laterality: Right;  20   PATCH ANGIOPLASTY Right 10/02/2021   Procedure: PATCH ANGIOPLASTY;  Surgeon: Kayla Part, MD;  Location: [facility] OR;   Service: Vascular;  Laterality: Right;   PROSTATE BIOPSY  07/2017   cancerous polyps x2  watching psa dr Secundino Dach at [facility]   TOTAL KNEE ARTHROPLASTY Left 05/28/2017   Procedure: LEFT TOTAL KNEE ARTHROPLASTY;  Surgeon: Genevie Kerns, MD;  Location: [facility];  Service: Orthopedics;  Laterality: Left;   TRANSTHORACIC ECHOCARDIOGRAM  06/02/2017   ef 55-60%/  possible AV bicuspid,  severely thickened and calcified leaflets with severely restricted leaflet opening, moderate regurg.(valve area 1.17cm^2,  mean gradient , peak gradient 63mmHg)/  mild LAE/  mild TR/  mild MV thickened leaflets and regurg. (not restricted and no stenosis   VASCULAR SURGERY      Social History   Socioeconomic History   Marital status: Married    Spouse name: Not on file   Number of children: Not on file   Years of education: Not on file   Highest education level: Not on file  Occupational History   Not on file  Tobacco Use   Smoking status: Former    Current packs/day: 0.00    Types: Cigarettes    Start date: 01/27/1965    Quit date: 01/28/1995    Years since quitting: 28.7    Passive exposure: Never   Smokeless tobacco: Never  Vaping Use   Vaping status: Never Used  Substance and Sexual Activity   Alcohol  use: No   Drug use: No   Sexual activity: Not Currently  Other Topics Concern   Not on file  Social History Narrative   Not on file   Social Drivers of Health   Financial Resource Strain: Not on file  Food Insecurity: No Food Insecurity (12/16/2022)   Hunger Vital Sign    Worried About Running Out of Food in the Last Year: Never true    Ran Out of Food in the Last Year: Never true  Transportation Needs: No Transportation Needs (12/16/2022)   PRAPARE - Administrator, Civil Service (Medical): No    Lack of Transportation (Non-Medical): No  Physical Activity: Not on file  Stress: Not on file  Social Connections: Not on file  Intimate Partner Violence: Not At Risk (12/16/2022)    Humiliation, Afraid, Rape, and Kick questionnaire    Fear of Current or Ex-Partner: No    Emotionally Abused: No    Physically Abused: No    Sexually Abused: No   No family history on file.  Current Outpatient Medications  Medication Sig Dispense Refill   acetaminophen  (TYLENOL ) 500 MG tablet Take 2 tablets (1,000 mg total) by mouth every 8 (eight) hours as needed for moderate pain or mild pain. 30 tablet 0   aspirin  81 MG chewable tablet Chew 81 mg by mouth.     Brinzolamide-Brimonidine  (SIMBRINZA) 1-0.2 % SUSP Place 1 drop into both eyes in the morning and at bedtime.     carvedilol  (COREG ) 25 MG tablet  Take 12.5 mg by mouth 2 (two) times daily with a meal.     cetirizine (ZYRTEC) 10 MG tablet Take 10 mg by mouth daily as needed for allergies.     chlorthalidone  (HYGROTON ) 25 MG tablet Take 12.5 mg by mouth daily.     cholecalciferol  (VITAMIN D3) 25 MCG (1000 UNIT) tablet Take 1,000 Units by mouth daily.     ezetimibe  (ZETIA ) 10 MG tablet Take 10 mg by mouth daily.     finasteride  (PROSCAR ) 5 MG tablet Take 5 mg by mouth every morning.   3   guaiFENesin  (MUCINEX ) 600 MG 12 hr tablet Take 600 mg by mouth 2 (two) times daily as needed for cough or to loosen phlegm.     HYDROmorphone  (DILAUDID ) 2 MG tablet Take 2 mg by mouth 3 (three) times daily.     latanoprost  (XALATAN ) 0.005 % ophthalmic solution Place 1 drop into both eyes at bedtime.      Lidocaine  HCl (PAIN RELIEF ROLL-ON EX) Apply 1 Application topically daily as needed (pain).     lisinopril  (ZESTRIL ) 5 MG tablet Take 5 mg by mouth daily.     metFORMIN  (GLUCOPHAGE -XR) 500 MG 24 hr tablet Take 1,000 mg by mouth 2 (two) times daily.     omeprazole (PRILOSEC) 20 MG capsule Take 20 mg by mouth daily as needed (acid reflux).     ondansetron  (ZOFRAN ) 4 MG tablet Take 1 tablet (4 mg total) by mouth every 8 (eight) hours as needed for nausea or vomiting. 30 tablet 0   Semaglutide, 1 MG/DOSE, (OZEMPIC, 1 MG/DOSE,) 2 MG/1.5ML SOPN Inject 1  mg into the skin every Monday.     tamsulosin  (FLOMAX ) 0.4 MG CAPS capsule Take 0.4 mg by mouth at bedtime.      No current facility-administered medications for this visit.    Allergies  Allergen Reactions   Lipitor  [Atorvastatin ] Other (See Comments)    Muscle pain   Other     BLOOD PRODUCT REFUSAL   Pravastatin Other (See Comments)    Muscle pain   Crestor [Rosuvastatin] Other (See Comments)    Muscle pain   Latex Hives   Tape Hives    Latex tape     REVIEW OF SYSTEMS:   [X]  denotes positive finding, [ ]  denotes negative finding Cardiac  Comments:  Chest pain or chest pressure:    Shortness of breath upon exertion:    Short of breath when lying flat:    Irregular heart rhythm:        Vascular    Pain in calf, thigh, or hip brought on by ambulation:    Pain in feet at night that wakes you up from your sleep:     Blood clot in your veins:    Leg swelling:         Pulmonary    Oxygen at home:    Productive cough:     Wheezing:         Neurologic    Sudden weakness in arms or legs:     Sudden numbness in arms or legs:     Sudden onset of difficulty speaking or slurred speech:    Temporary loss of vision in one eye:     Problems with dizziness:         Gastrointestinal    Blood in stool:     Vomited blood:         Genitourinary    Burning when urinating:     Blood  in urine:        Psychiatric    Major depression:         Hematologic    Bleeding problems:    Problems with blood clotting too easily:        Skin    Rashes or ulcers:        Constitutional    Fever or chills:      PHYSICAL EXAMINATION:  Vitals:   10/28/23 1050  BP: (!) 153/68  Pulse: 68  Temp: 98.5 F (36.9 C)  TempSrc: Temporal  SpO2: 99%  Weight: 159 lb 3.2 oz (72.2 kg)  Height: 5\' 9"  (1.753 m)    General:  WDWN in NAD; vital signs documented above Gait: Normal, ambulates with a cane HENT: WNL, normocephalic Pulmonary: normal non-labored breathing without  wheezing Cardiac: regular HR Abdomen: soft Extremities: without ischemic changes, without Gangrene , without cellulitis; without open wounds; 2+ femoral pulses, 2+ left DP pulse, no palpable right pulses. Feet warm and well perfused. No swelling or edema Musculoskeletal: no muscle wasting or atrophy  Neurologic: A&O X 3 Psychiatric:  The pt has Normal affect.   Non-Invasive Vascular Imaging:   +-------+-----------+-----------+------------+------------+  ABI/TBIToday's ABIToday's TBIPrevious ABIPrevious TBI  +-------+-----------+-----------+------------+------------+  Right 0.68       0.34       0.60        0.41          +-------+-----------+-----------+------------+------------+  Left  0.88       0.33       0.63        0.45          +-------+-----------+-----------+------------+------------+   Right ABIs appear essentially unchanged. Left ABIs appear increased.   VAS US  Aorta/IVC/Iliacs:  Summary:  IVC/Iliac: Patent right external iliac artery stent with no visualized stenosis.      ASSESSMENT/PLAN:: 78 y.o. male here for follow up for PAD. S/p right external iliac artery stenting, iliofemoral endarterectomy, and bovine patch angioplasty by Dr. Rosalva Comber on 10/02/2021. This was performed due to rest pain of the right foot. He has known bilateral SFA occlusions. He is without any claudication, rest pain or tissue loss. He is experiencing some swelling in BLE as well as early symptoms of neuropathy. Recommend continued follow up with PCP and Podiatry for management of his neuropathy. Encourage him to elevate his legs to assist with the swelling. Can consider light knee high compression stockings as well. Provided reassurance that he likely has some mild venous insufficiency that is causing his swelling.  - ABIs are overall stable from prior study 1 year ago. Left has actually improved - Duplex shows patent right EIA stent - continue Aspirin  and Statin - encourage walking/  exercise regimen - he will follow up in 1 year with aorto/iliac/IVC duplex and ABI's   Deneen Finical, PA-C [facility] [phone]  Clinic MD:   Rosalva Comber

## 2023-11-22 DIAGNOSIS — H409 Unspecified glaucoma: Secondary | ICD-10-CM | POA: Diagnosis not present

## 2023-11-22 DIAGNOSIS — I739 Peripheral vascular disease, unspecified: Secondary | ICD-10-CM | POA: Diagnosis not present

## 2023-11-22 DIAGNOSIS — I10 Essential (primary) hypertension: Secondary | ICD-10-CM | POA: Diagnosis not present

## 2023-11-22 DIAGNOSIS — E785 Hyperlipidemia, unspecified: Secondary | ICD-10-CM | POA: Diagnosis not present

## 2023-11-22 DIAGNOSIS — C61 Malignant neoplasm of prostate: Secondary | ICD-10-CM | POA: Diagnosis not present

## 2023-11-22 DIAGNOSIS — E1159 Type 2 diabetes mellitus with other circulatory complications: Secondary | ICD-10-CM | POA: Diagnosis not present

## 2023-12-13 DIAGNOSIS — C61 Malignant neoplasm of prostate: Secondary | ICD-10-CM | POA: Diagnosis not present

## 2023-12-20 DIAGNOSIS — C61 Malignant neoplasm of prostate: Secondary | ICD-10-CM | POA: Diagnosis not present

## 2023-12-20 DIAGNOSIS — N5201 Erectile dysfunction due to arterial insufficiency: Secondary | ICD-10-CM | POA: Diagnosis not present

## 2023-12-20 DIAGNOSIS — R338 Other retention of urine: Secondary | ICD-10-CM | POA: Diagnosis not present

## 2024-03-14 DIAGNOSIS — I359 Nonrheumatic aortic valve disorder, unspecified: Secondary | ICD-10-CM | POA: Diagnosis not present

## 2024-03-14 DIAGNOSIS — I129 Hypertensive chronic kidney disease with stage 1 through stage 4 chronic kidney disease, or unspecified chronic kidney disease: Secondary | ICD-10-CM | POA: Diagnosis not present

## 2024-03-14 DIAGNOSIS — E785 Hyperlipidemia, unspecified: Secondary | ICD-10-CM | POA: Diagnosis not present

## 2024-03-14 DIAGNOSIS — M1990 Unspecified osteoarthritis, unspecified site: Secondary | ICD-10-CM | POA: Diagnosis not present

## 2024-03-14 DIAGNOSIS — N189 Chronic kidney disease, unspecified: Secondary | ICD-10-CM | POA: Diagnosis not present

## 2024-03-14 DIAGNOSIS — I70223 Atherosclerosis of native arteries of extremities with rest pain, bilateral legs: Secondary | ICD-10-CM | POA: Diagnosis not present

## 2024-03-14 DIAGNOSIS — I700 Atherosclerosis of aorta: Secondary | ICD-10-CM | POA: Diagnosis not present

## 2024-03-14 DIAGNOSIS — E1122 Type 2 diabetes mellitus with diabetic chronic kidney disease: Secondary | ICD-10-CM | POA: Diagnosis not present

## 2024-03-14 DIAGNOSIS — I252 Old myocardial infarction: Secondary | ICD-10-CM | POA: Diagnosis not present

## 2024-03-14 DIAGNOSIS — I2510 Atherosclerotic heart disease of native coronary artery without angina pectoris: Secondary | ICD-10-CM | POA: Diagnosis not present

## 2024-03-14 DIAGNOSIS — Z7982 Long term (current) use of aspirin: Secondary | ICD-10-CM | POA: Diagnosis not present

## 2024-03-14 DIAGNOSIS — Z7985 Long-term (current) use of injectable non-insulin antidiabetic drugs: Secondary | ICD-10-CM | POA: Diagnosis not present

## 2024-03-14 DIAGNOSIS — I77819 Aortic ectasia, unspecified site: Secondary | ICD-10-CM | POA: Diagnosis not present

## 2024-03-14 DIAGNOSIS — J302 Other seasonal allergic rhinitis: Secondary | ICD-10-CM | POA: Diagnosis not present

## 2024-03-14 DIAGNOSIS — H409 Unspecified glaucoma: Secondary | ICD-10-CM | POA: Diagnosis not present

## 2024-03-14 DIAGNOSIS — F325 Major depressive disorder, single episode, in full remission: Secondary | ICD-10-CM | POA: Diagnosis not present

## 2024-03-14 DIAGNOSIS — I879 Disorder of vein, unspecified: Secondary | ICD-10-CM | POA: Diagnosis not present

## 2024-03-14 DIAGNOSIS — E1151 Type 2 diabetes mellitus with diabetic peripheral angiopathy without gangrene: Secondary | ICD-10-CM | POA: Diagnosis not present
